# Patient Record
Sex: Male | Born: 1940 | Race: Black or African American | Hispanic: No | Marital: Married | Smoking: Never smoker
Health system: Southern US, Community
[De-identification: ages and names within clinical notes are randomized; demographics above are authoritative.]

## PROBLEM LIST (undated history)

## (undated) ENCOUNTER — Emergency Department (HOSPITAL_COMMUNITY)

## (undated) DIAGNOSIS — J302 Other seasonal allergic rhinitis: Secondary | ICD-10-CM

## (undated) DIAGNOSIS — I10 Essential (primary) hypertension: Secondary | ICD-10-CM

## (undated) DIAGNOSIS — J45909 Unspecified asthma, uncomplicated: Secondary | ICD-10-CM

---

## 2006-02-02 ENCOUNTER — Encounter: Admission: RE | Admit: 2006-02-02 | Discharge: 2006-02-02 | Payer: Self-pay | Admitting: Family Medicine

## 2010-10-19 ENCOUNTER — Emergency Department (HOSPITAL_COMMUNITY)
Admission: EM | Admit: 2010-10-19 | Discharge: 2010-10-19 | Disposition: A | Discharge status: Home / Self Care | Payer: Medicare Other | Attending: Emergency Medicine | Admitting: Emergency Medicine

## 2010-10-19 DIAGNOSIS — N390 Urinary tract infection, site not specified: Secondary | ICD-10-CM | POA: Insufficient documentation

## 2010-10-19 DIAGNOSIS — J45909 Unspecified asthma, uncomplicated: Secondary | ICD-10-CM | POA: Insufficient documentation

## 2010-10-19 DIAGNOSIS — R350 Frequency of micturition: Secondary | ICD-10-CM | POA: Insufficient documentation

## 2010-10-19 LAB — URINE MICROSCOPIC-ADD ON

## 2010-10-19 LAB — URINALYSIS, ROUTINE W REFLEX MICROSCOPIC
Bilirubin Urine: NEGATIVE
Glucose, UA: NEGATIVE mg/dL
Ketones, ur: NEGATIVE mg/dL
Nitrite: POSITIVE — AB
Specific Gravity, Urine: 1.019 (ref 1.005–1.030)
Urobilinogen, UA: 1 mg/dL (ref 0.0–1.0)
pH: 5.5 (ref 5.0–8.0)

## 2010-10-19 LAB — GLUCOSE, CAPILLARY: Glucose-Capillary: 127 mg/dL — ABNORMAL HIGH (ref 70–99)

## 2010-10-21 LAB — URINE CULTURE: Culture  Setup Time: 201210061457

## 2011-02-24 DIAGNOSIS — J45909 Unspecified asthma, uncomplicated: Secondary | ICD-10-CM | POA: Diagnosis not present

## 2011-02-24 DIAGNOSIS — I10 Essential (primary) hypertension: Secondary | ICD-10-CM | POA: Diagnosis not present

## 2011-02-24 DIAGNOSIS — E7800 Pure hypercholesterolemia, unspecified: Secondary | ICD-10-CM | POA: Diagnosis not present

## 2011-02-24 DIAGNOSIS — Z79899 Other long term (current) drug therapy: Secondary | ICD-10-CM | POA: Diagnosis not present

## 2011-02-24 DIAGNOSIS — J309 Allergic rhinitis, unspecified: Secondary | ICD-10-CM | POA: Diagnosis not present

## 2011-02-24 DIAGNOSIS — Z23 Encounter for immunization: Secondary | ICD-10-CM | POA: Diagnosis not present

## 2011-08-28 ENCOUNTER — Emergency Department (INDEPENDENT_AMBULATORY_CARE_PROVIDER_SITE_OTHER)
Admission: EM | Admit: 2011-08-28 | Discharge: 2011-08-28 | Disposition: A | Discharge status: Home / Self Care | Payer: Medicare Other | Source: Home / Self Care | Attending: Emergency Medicine | Admitting: Emergency Medicine

## 2011-08-28 ENCOUNTER — Encounter (HOSPITAL_COMMUNITY): Payer: Self-pay | Admitting: Emergency Medicine

## 2011-08-28 DIAGNOSIS — J309 Allergic rhinitis, unspecified: Secondary | ICD-10-CM

## 2011-08-28 DIAGNOSIS — L738 Other specified follicular disorders: Secondary | ICD-10-CM

## 2011-08-28 DIAGNOSIS — L538 Other specified erythematous conditions: Secondary | ICD-10-CM

## 2011-08-28 DIAGNOSIS — L304 Erythema intertrigo: Secondary | ICD-10-CM

## 2011-08-28 DIAGNOSIS — L739 Follicular disorder, unspecified: Secondary | ICD-10-CM

## 2011-08-28 HISTORY — DX: Essential (primary) hypertension: I10

## 2011-08-28 HISTORY — DX: Unspecified asthma, uncomplicated: J45.909

## 2011-08-28 HISTORY — DX: Other seasonal allergic rhinitis: J30.2

## 2011-08-28 MED ORDER — ALUM SULFATE-CA ACETATE EX PACK: 1.0000 | PACK | Freq: Two times a day (BID) | CUTANEOUS | Status: AC

## 2011-08-28 MED ORDER — CEPHALEXIN 500 MG PO CAPS: 500.0000 mg | ORAL_CAPSULE | Freq: Four times a day (QID) | ORAL | Status: AC

## 2011-08-28 MED ORDER — NYSTATIN 100000 UNIT/GM EX POWD: Freq: Four times a day (QID) | CUTANEOUS | Status: AC

## 2011-08-28 MED ORDER — NYSTATIN 100000 UNIT/GM EX CREA: TOPICAL_CREAM | CUTANEOUS | Status: AC

## 2011-08-28 NOTE — ED Provider Notes (Signed)
History     CSN: [id number]  Arrival date & time 08/28/11  1321   First MD Initiated Contact with Patient 08/28/11 1348      Chief Complaint  Patient presents with  . Rash    (Consider location/radiation/quality/duration/timing/severity/associated sxs/prior treatment) HPI Comments: Patient reports a flat, macerated, itchy areas under bilateral breasts starting several weeks ago. He started using Gold Bond lotion with some relief, but started using Cetaphil soap which has started a different, pustular rash over his chest starting yesterday. Denies any rash anywhere else. No nausea, vomiting, fevers. No other new lotions, soaps, detergents. No contacts with similar rash. No exposure to poison ivy, no pets in the house.   Second, patient reports 9 months of persistent nasal congestion. States that he's been tried on multiple nasal sprays, but isn't sure of the names. States that he intermittently uses Afrin, no use in 3 days. States that "nothing works". He has a history of severe seasonal allergies, and allergic rhinitis. Denies epistaxis, nasal drainage, facial pressure/pain, fevers.  ROS as noted in HPI. All other ROS negative.   Patient is a 71 y.o. male presenting with rash. The history is provided by the patient. No language interpreter was used.  Rash  This is a new problem. The current episode started more than 1 week ago. The problem has been gradually worsening. The problem is associated with an unknown factor. There has been no fever. The rash is present on the torso. Associated symptoms include itching. Pertinent negatives include no blisters, no pain and no weeping.    Past Medical History  Diagnosis Date  . Hypertension   . Asthma   . Seasonal allergies     History reviewed. No pertinent past surgical history.  History reviewed. No pertinent family history.  History  Substance Use Topics  . Smoking status: Never Smoker   . Smokeless tobacco: Not on file  . Alcohol  Use: No      Review of Systems  Skin: Positive for itching and rash.    Allergies  Review of patient's allergies indicates no known allergies.  Home Medications   Current Outpatient Rx  Name Route Sig Dispense Refill  . ALBUTEROL SULFATE (2.5 MG/3ML) 0.083% IN NEBU Nebulization Take 2.5 mg by nebulization every 6 (six) hours as needed.    . ALBUTEROL IN Inhalation Inhale into the lungs.    Marland Kitchen OVER THE COUNTER MEDICATION  One type of blood pressure medicine, cannot remember name. Also using a nasal spray, unsure of name    . UNKNOWN TO PATIENT  BP med    . UNKNOWN TO PATIENT  Nasal steriod    . ALUM SULFATE-CA ACETATE EX PACK Topical Apply 1 packet topically 2 (two) times daily. 1 packet in warm water. Soak for 15 minutes bid. 100 each 0  . CEPHALEXIN 500 MG PO CAPS Oral Take 1 capsule (500 mg total) by mouth 4 (four) times daily. X 10 days 40 capsule 0  . NYSTATIN 100000 UNIT/GM EX POWD Topical Apply topically 4 (four) times daily. 15 g 0  . NYSTATIN 100000 UNIT/GM EX CREA  Apply to affected area 2 times daily till symptoms resolve 30 g 0    BP 135/80  Pulse 90  Temp 97.4 F (36.3 C) (Oral)  Resp 20  SpO2 98%  Physical Exam  Nursing note and vitals reviewed. Constitutional: He is oriented to person, place, and time. He appears well-developed and well-nourished.  HENT:  Head: Normocephalic and atraumatic.  Nose:  Right sinus exhibits no maxillary sinus tenderness and no frontal sinus tenderness. Left sinus exhibits no maxillary sinus tenderness and no frontal sinus tenderness.       Bilateral occlusion. Swollen, pale, boggy nasal mucosa. No purulent drainage.  Eyes: Conjunctivae and EOM are normal.  Neck: Normal range of motion.  Cardiovascular: Normal rate.   Pulmonary/Chest: Effort normal. No respiratory distress.  Abdominal: He exhibits no distension.  Musculoskeletal: Normal range of motion.  Neurological: He is alert and oriented to person, place, and time.  Coordination normal.  Skin: Skin is warm and dry. Rash noted.       Flat, macerated, weepy, erythematous symmetric areas under bilateral breasts consistent with intertrigo. Nontender Pustular rash on hair follicles on chest. No other rash anywhere else.  Psychiatric: He has a normal mood and affect. His behavior is normal. Judgment and thought content normal.    ED Course  Procedures (including critical care time)  Labs Reviewed - No data to display No results found.   1. Folliculitis   2. Allergic rhinitis   3. 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 Advised to stop using Afrin. He has already tried multiple  nasal steroids, and by mouth sedating antihistamines without improvement. No signs of sinusitis. Referring him to Dr. Emeline Darling, ENT on call.  He also with intertrigo bilateral underneath his breast- starting nystatin cream, and will have him move to the powder when his symptoms get better with. Has a folliculitis over his chest almost likely from the Cetaphil soap-  Keflex for this. Followup with Dr. Clovis Riley as needed.  Luiz Blare, MD 08/28/11 1556

## 2011-08-28 NOTE — ED Notes (Signed)
Reports rash on chest for 2 weeks, worsened lately.  Rash is a red, splotchy rash to chest, epigastric area. Reports rash on back.  Back does not look like chest  Reports itchy areas under breasts.   Also c/o nasal congestion for 8 months, reports seeing physician for this and tried many meds with no relief.

## 2011-09-17 DIAGNOSIS — I10 Essential (primary) hypertension: Secondary | ICD-10-CM | POA: Diagnosis not present

## 2011-09-17 DIAGNOSIS — Z79899 Other long term (current) drug therapy: Secondary | ICD-10-CM | POA: Diagnosis not present

## 2011-09-17 DIAGNOSIS — Z1211 Encounter for screening for malignant neoplasm of colon: Secondary | ICD-10-CM | POA: Diagnosis not present

## 2011-09-17 DIAGNOSIS — J45909 Unspecified asthma, uncomplicated: Secondary | ICD-10-CM | POA: Diagnosis not present

## 2011-09-17 DIAGNOSIS — J309 Allergic rhinitis, unspecified: Secondary | ICD-10-CM | POA: Diagnosis not present

## 2012-01-11 ENCOUNTER — Emergency Department (HOSPITAL_COMMUNITY)
Admission: EM | Admit: 2012-01-11 | Discharge: 2012-01-11 | Disposition: A | Discharge status: Home / Self Care | Payer: Medicare Other | Attending: Emergency Medicine | Admitting: Emergency Medicine

## 2012-01-11 ENCOUNTER — Emergency Department (HOSPITAL_COMMUNITY): Payer: Medicare Other

## 2012-01-11 ENCOUNTER — Encounter (HOSPITAL_COMMUNITY): Payer: Self-pay | Admitting: *Deleted

## 2012-01-11 DIAGNOSIS — J45909 Unspecified asthma, uncomplicated: Secondary | ICD-10-CM | POA: Diagnosis not present

## 2012-01-11 DIAGNOSIS — R0602 Shortness of breath: Secondary | ICD-10-CM | POA: Diagnosis not present

## 2012-01-11 DIAGNOSIS — J45901 Unspecified asthma with (acute) exacerbation: Secondary | ICD-10-CM

## 2012-01-11 DIAGNOSIS — I10 Essential (primary) hypertension: Secondary | ICD-10-CM | POA: Insufficient documentation

## 2012-01-11 DIAGNOSIS — R062 Wheezing: Secondary | ICD-10-CM | POA: Diagnosis not present

## 2012-01-11 DIAGNOSIS — R05 Cough: Secondary | ICD-10-CM | POA: Diagnosis not present

## 2012-01-11 LAB — CBC WITH DIFFERENTIAL/PLATELET
Basophils Absolute: 0 10*3/uL (ref 0.0–0.1)
Eosinophils Relative: 7 % — ABNORMAL HIGH (ref 0–5)
HCT: 38.1 % — ABNORMAL LOW (ref 39.0–52.0)
MCV: 83.2 fL (ref 78.0–100.0)
Monocytes Relative: 7 % (ref 3–12)
Neutro Abs: 3.4 10*3/uL (ref 1.7–7.7)
RBC: 4.58 MIL/uL (ref 4.22–5.81)
WBC: 5.5 10*3/uL (ref 4.0–10.5)

## 2012-01-11 LAB — BASIC METABOLIC PANEL
CO2: 24 mEq/L (ref 19–32)
Chloride: 101 mEq/L (ref 96–112)
GFR calc Af Amer: >90 mL/min (ref >90)
Glucose, Bld: 129 mg/dL — ABNORMAL HIGH (ref 70–99)
Potassium: 3.7 mEq/L (ref 3.5–5.1)

## 2012-01-11 MED ORDER — PREDNISONE 20 MG PO TABS
60.0000 mg | ORAL_TABLET | Freq: Once | ORAL | Status: AC
  Administered 2012-01-11: 60 mg via ORAL
  Filled 2012-01-11: qty 3

## 2012-01-11 MED ORDER — ALBUTEROL SULFATE (5 MG/ML) 0.5% IN NEBU
5.0000 mg | INHALATION_SOLUTION | Freq: Once | RESPIRATORY_TRACT | Status: AC
  Administered 2012-01-11: 5 mg via RESPIRATORY_TRACT
  Filled 2012-01-11: qty 40

## 2012-01-11 MED ORDER — IPRATROPIUM BROMIDE 0.02 % IN SOLN
0.5000 mg | Freq: Once | RESPIRATORY_TRACT | Status: AC
  Administered 2012-01-11: 0.5 mg via RESPIRATORY_TRACT
  Filled 2012-01-11: qty 2.5

## 2012-01-11 MED ORDER — PREDNISONE 50 MG PO TABS: ORAL_TABLET | ORAL | Status: DC

## 2012-01-11 NOTE — ED Notes (Addendum)
Pt presents with SOB that started tonight when he laid down.  Pt had 2 breathing treatments at home without relief, wheezing heard upon arrival to ED.  STAT breathing treatment given and EKG done.  Pt sts he is breathing better after the third treatment, placed on cardiac monitor.

## 2012-01-11 NOTE — ED Notes (Signed)
Pt to xray

## 2012-01-11 NOTE — ED Provider Notes (Signed)
History     CSN: [id number]  Arrival date & time 01/11/12  0035   First MD Initiated Contact with Patient 01/11/12 0250      Chief Complaint  Patient presents with  . Shortness of Breath     Patient is a 71 y.o. male presenting with shortness of breath. The history is provided by the patient.  Shortness of Breath  The current episode started today. The onset was gradual. The problem occurs continuously. The problem has been gradually worsening. The problem is moderate. The symptoms are relieved by beta-agonist inhalers. The symptoms are aggravated by activity. Associated symptoms include cough, shortness of breath and wheezing. Pertinent negatives include no chest pain.  pt reports cough, congestion, wheezing that worsened in the past several days He reports nasal congestion as well He reports he has been using his nebs at home with increasing frequency recently No cp.  No syncope.  He reports h/o asthma but it is usually well controlled  Past Medical History  Diagnosis Date  . Hypertension   . Asthma   . Seasonal allergies     History reviewed. No pertinent past surgical history.  History reviewed. No pertinent family history.  History  Substance Use Topics  . Smoking status: Never Smoker   . Smokeless tobacco: Not on file  . Alcohol Use: No      Review of Systems  Respiratory: Positive for cough, shortness of breath and wheezing.   Cardiovascular: Negative for chest pain.  Neurological: Negative for weakness.  Psychiatric/Behavioral: Negative for agitation.  All other systems reviewed and are negative.    Allergies  Review of patient's allergies indicates no known allergies.  Home Medications   Current Outpatient Rx  Name  Route  Sig  Dispense  Refill  . ALBUTEROL SULFATE HFA 108 (90 BASE) MCG/ACT IN AERS   Inhalation   Inhale 2 puffs into the lungs every 6 (six) hours as needed. wheezing         . ALBUTEROL SULFATE (2.5 MG/3ML) 0.083% IN NEBU  Nebulization   Take 2.5 mg by nebulization every 6 (six) hours as needed. wheezing         . AMLODIPINE BESY-BENAZEPRIL HCL 10-20 MG PO CAPS   Oral   Take 1 capsule by mouth daily.         Merrilee Seashore D PO   Oral   Take 1 tablet by mouth every 6 (six) hours as needed. For cold symptoms         . FLUTICASONE PROPIONATE  HFA 110 MCG/ACT IN AERO   Inhalation   Inhale 1 puff into the lungs 2 (two) times daily.         Marland Kitchen ALUM SULFATE-CA ACETATE EX PACK   Topical   Apply 1 packet topically 2 (two) times daily. 1 packet in warm water. Soak for 15 minutes bid.   100 each   0   . NYSTATIN 100000 UNIT/GM EX POWD   Topical   Apply topically 4 (four) times daily.   15 g   0   . NYSTATIN 100000 UNIT/GM EX CREA      Apply to affected area 2 times daily till symptoms resolve   30 g   0     Pulse 120  Temp 97.5 F (36.4 C)  Resp 26  SpO2 94% BP 134/81  Pulse 111  Temp 97.5 F (36.4 C)  Resp 17  SpO2 92%   Physical Exam CONSTITUTIONAL: Well developed/well nourished HEAD AND  FACE: Normocephalic/atraumatic EYES: EOMI/PERRL ENMT: Mucous membranes moist, nasal congestion noted NECK: supple no meningeal signs SPINE:entire spine nontender CV: S1/S2 noted, no murmurs/rubs/gallops noted LUNGS: scattered wheezing on my exam.  No distress noted ABDOMEN: soft, nontender, no rebound or guarding GU:no cva tenderness NEURO: Pt is awake/alert, moves all extremitiesx4 EXTREMITIES: pulses normal, full ROM, no edema noted SKIN: warm, color normal PSYCH: no abnormalities of mood noted  ED Course  Procedures   Labs Reviewed  BASIC METABOLIC PANEL - Abnormal; Notable for the following:    Glucose, Bld 129 (*)     GFR calc non Af Amer 81 (*)     All other components within normal limits  CBC WITH DIFFERENTIAL - Abnormal; Notable for the following:    Hemoglobin 12.2 (*)     HCT 38.1 (*)     Eosinophils Relative 7 (*)     All other components within normal limits   Dg  Chest 2 View (if Patient Has Fever And/or Copd)  01/11/2012  *RADIOLOGY REPORT*  Clinical Data: Cough, congestion and shortness of breath.  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  No edema, infiltrate, nodule or pneumothorax is identified.  No pleural effusions.  Heart size is stable and within normal limits. Bony thorax shows stable degenerative changes of the thoracic spine.  IMPRESSION: No active disease.   Original Report Authenticated By: Irish Lack, M.D.      3:20 AM Pt had already received nebs before my eval (his wheezing has improved) but will give another treatment 4:46 AM Pt feels improved.  He is in no distress.  No tachypnea noted   He was ambulatory without hypoxia/distress He requests d/c home   MDM  Nursing notes including past medical history and social history reviewed and considered in documentation xrays reviewed and considered Labs/vital reviewed and considered        Date: 01/11/2012  Rate: 114  Rhythm: sinus tachycardia  QRS Axis: normal  Intervals: normal  ST/T Wave abnormalities: nonspecific ST changes  Conduction Disutrbances:none    Joya Gaskins, MD 01/11/12 [phone]

## 2012-01-11 NOTE — ED Notes (Signed)
Pt ambulated in hallway from room 16 to pod C and back.  O2 sat remained between 96-97% and pain states he feels fine.  NAD.  Dr. Bebe Shaggy aware.

## 2012-01-11 NOTE — ED Notes (Signed)
Pt c/o worsening asthma, SOB  Tonight.  Has used 2 nebulizer tx at home with no relief.  C/o cough, nasal congestion denies fevers/chills

## 2012-03-24 DIAGNOSIS — J45909 Unspecified asthma, uncomplicated: Secondary | ICD-10-CM | POA: Diagnosis not present

## 2012-03-24 DIAGNOSIS — E7800 Pure hypercholesterolemia, unspecified: Secondary | ICD-10-CM | POA: Diagnosis not present

## 2012-03-24 DIAGNOSIS — I10 Essential (primary) hypertension: Secondary | ICD-10-CM | POA: Diagnosis not present

## 2012-03-24 DIAGNOSIS — J309 Allergic rhinitis, unspecified: Secondary | ICD-10-CM | POA: Diagnosis not present

## 2012-03-24 DIAGNOSIS — Z79899 Other long term (current) drug therapy: Secondary | ICD-10-CM | POA: Diagnosis not present

## 2012-10-07 DIAGNOSIS — J45909 Unspecified asthma, uncomplicated: Secondary | ICD-10-CM | POA: Diagnosis not present

## 2012-10-07 DIAGNOSIS — I10 Essential (primary) hypertension: Secondary | ICD-10-CM | POA: Diagnosis not present

## 2012-10-07 DIAGNOSIS — J309 Allergic rhinitis, unspecified: Secondary | ICD-10-CM | POA: Diagnosis not present

## 2012-10-07 DIAGNOSIS — Z79899 Other long term (current) drug therapy: Secondary | ICD-10-CM | POA: Diagnosis not present

## 2012-12-29 ENCOUNTER — Encounter (HOSPITAL_COMMUNITY): Payer: Self-pay | Admitting: Emergency Medicine

## 2012-12-29 ENCOUNTER — Emergency Department (HOSPITAL_COMMUNITY)
Admission: EM | Admit: 2012-12-29 | Discharge: 2012-12-29 | Disposition: A | Discharge status: Home / Self Care | Payer: Medicare Other | Attending: Emergency Medicine | Admitting: Emergency Medicine

## 2012-12-29 ENCOUNTER — Emergency Department (HOSPITAL_COMMUNITY): Payer: Medicare Other

## 2012-12-29 DIAGNOSIS — I10 Essential (primary) hypertension: Secondary | ICD-10-CM | POA: Diagnosis not present

## 2012-12-29 DIAGNOSIS — J209 Acute bronchitis, unspecified: Secondary | ICD-10-CM | POA: Diagnosis not present

## 2012-12-29 DIAGNOSIS — J45901 Unspecified asthma with (acute) exacerbation: Secondary | ICD-10-CM | POA: Diagnosis not present

## 2012-12-29 DIAGNOSIS — R042 Hemoptysis: Secondary | ICD-10-CM | POA: Diagnosis not present

## 2012-12-29 DIAGNOSIS — IMO0002 Reserved for concepts with insufficient information to code with codable children: Secondary | ICD-10-CM | POA: Diagnosis not present

## 2012-12-29 DIAGNOSIS — R1013 Epigastric pain: Secondary | ICD-10-CM | POA: Insufficient documentation

## 2012-12-29 DIAGNOSIS — R0789 Other chest pain: Secondary | ICD-10-CM | POA: Diagnosis not present

## 2012-12-29 DIAGNOSIS — Z79899 Other long term (current) drug therapy: Secondary | ICD-10-CM | POA: Diagnosis not present

## 2012-12-29 DIAGNOSIS — J40 Bronchitis, not specified as acute or chronic: Secondary | ICD-10-CM

## 2012-12-29 LAB — TROPONIN I: Troponin I: <0.3 ng/mL (ref <0.30)

## 2012-12-29 LAB — COMPREHENSIVE METABOLIC PANEL
ALT: 18 U/L (ref 0–53)
BUN: 17 mg/dL (ref 6–23)
Calcium: 9.2 mg/dL (ref 8.4–10.5)
Chloride: 105 mEq/L (ref 96–112)
Creatinine, Ser: 0.97 mg/dL (ref 0.50–1.35)
GFR calc Af Amer: >90 mL/min (ref >90)
Glucose, Bld: 95 mg/dL (ref 70–99)
Sodium: 140 mEq/L (ref 135–145)
Total Protein: 7.6 g/dL (ref 6.0–8.3)

## 2012-12-29 LAB — CBC WITH DIFFERENTIAL/PLATELET
Eosinophils Absolute: 0.4 10*3/uL (ref 0.0–0.7)
Eosinophils Relative: 9 % — ABNORMAL HIGH (ref 0–5)
HCT: 39.1 % (ref 39.0–52.0)
Hemoglobin: 13 g/dL (ref 13.0–17.0)
Lymphocytes Relative: 25 % (ref 12–46)
Lymphs Abs: 1.2 10*3/uL (ref 0.7–4.0)
MCH: 27.5 pg (ref 26.0–34.0)
MCV: 82.8 fL (ref 78.0–100.0)
Monocytes Absolute: 0.4 10*3/uL (ref 0.1–1.0)
Neutro Abs: 2.7 10*3/uL (ref 1.7–7.7)
Platelets: 220 10*3/uL (ref 150–400)
RDW: 14.9 % (ref 11.5–15.5)
WBC: 4.8 10*3/uL (ref 4.0–10.5)

## 2012-12-29 LAB — D-DIMER, QUANTITATIVE: D-Dimer, Quant: 1.08 ug/mL-FEU — ABNORMAL HIGH (ref 0.00–0.48)

## 2012-12-29 MED ORDER — SODIUM CHLORIDE 0.9 % IV BOLUS (SEPSIS)
1000.0000 mL | Freq: Once | INTRAVENOUS | Status: AC
  Administered 2012-12-29: 1000 mL via INTRAVENOUS

## 2012-12-29 MED ORDER — IOHEXOL 350 MG/ML SOLN
100.0000 mL | Freq: Once | INTRAVENOUS | Status: AC | PRN
  Administered 2012-12-29: 100 mL via INTRAVENOUS

## 2012-12-29 MED ORDER — AZITHROMYCIN 250 MG PO TABS: 250.0000 mg | ORAL_TABLET | Freq: Every day | ORAL | Status: DC

## 2012-12-29 NOTE — ED Provider Notes (Signed)
  Face-to-face evaluation   History: Cough, productive of sputum for several days, now with blood. Mild, chronic shortness of breath is unchanged. He denies chest pain.  Physical exam: Obese, alert man in mild discomfort. On evaluation-decreased air movement, bilateral lungs without wheezes, rales, or rhonchi  Medical screening examination/treatment/procedure(s) were conducted as a shared visit with non-physician practitioner(s) and myself.  I personally evaluated the patient during the encounter  Flint Melter, MD 12/30/12 1650

## 2012-12-29 NOTE — ED Notes (Signed)
EKG completed and given to EDP.  

## 2012-12-29 NOTE — ED Notes (Signed)
PT reports coughing up bright red blood mixed with mucous this morning for about 2 mins. PT reports he's been coughing up mucous for about a week and states it has been mostly clear or yellow.

## 2012-12-29 NOTE — ED Provider Notes (Signed)
CSN: [id number]     Arrival date & time 12/29/12  0900 History   First MD Initiated Contact with Patient 12/29/12 0940     Chief Complaint  Patient presents with  . Cough  . Hemoptysis   (Consider location/radiation/quality/duration/timing/severity/associated sxs/prior Treatment) The history is provided by the patient. No language interpreter was used.  [name] [name] is a 72 y/o M with PMHx of HTN and asthma presenting to the ED with nasal congestion and cough that has been ongoing for the past year - denied history of smoking. Patient reported that for the past 2 weeks he has been experiencing chest discomfort described as a tightness to the center of his chest that is intermittent. Patient reported that for the past 2 weeks he has been having hemoptysis every morning, with this morning being the worst case. Patient reported that he coughed up bright red blood and, streaking in the mucus that he coughs up - denied clots. Patient reported that he likes to eat foods that are spicy, loves hot sauce and reported that he has epigastric discomfort when laying down - stated that he eats dinner late at night and then goes to bed shortly afterwards. Patient reported that he has been feeling tired lately. When asked about shortness of breath, patient reported that he is always out of breath that this is nothing new. Denied difficulty breathing, nausea, vomiting, diarrhea, dizziness, headache, fainting, melena, hematochezia, abdominal pain, dysuria, hematuria.  PCP dr. Clovis Riley  Past Medical History  Diagnosis Date  . Hypertension   . Asthma   . Seasonal allergies    History reviewed. No pertinent past surgical history. History reviewed. No pertinent family history. History  Substance Use Topics  . Smoking status: Never Smoker   . Smokeless tobacco: Not on file  . Alcohol Use: No    Review of Systems  Constitutional: Negative for fever and chills.  HENT: Negative for sore throat and trouble  swallowing.   Respiratory: Positive for cough and chest tightness.        Hemoptysis  Gastrointestinal: Negative for nausea, vomiting, abdominal pain and diarrhea.  Genitourinary: Negative for hematuria and decreased urine volume.  Musculoskeletal: Negative for back pain and neck pain.  Neurological: Negative for dizziness, weakness and headaches.  All other systems reviewed and are negative.    Allergies  Review of patient's allergies indicates no known allergies.  Home Medications   Current Outpatient Rx  Name  Route  Sig  Dispense  Refill  . albuterol (PROAIR HFA) 108 (90 BASE) MCG/ACT inhaler   Inhalation   Inhale 1 puff into the lungs every 4 (four) hours as needed for wheezing or shortness of breath.         Marland Kitchen amLODipine-benazepril (LOTREL) 10-20 MG per capsule   Oral   Take 1 capsule by mouth daily.         . beclomethasone (QVAR) 40 MCG/ACT inhaler   Inhalation   Inhale 1 puff into the lungs 2 (two) times daily as needed (shortness of breath).         . Chlorphen-Pseudoephed-APAP (CORICIDIN D PO)   Oral   Take 2 tablets by mouth every 6 (six) hours as needed (cold symptoms).         Marland Kitchen ipratropium-albuterol (DUONEB) 0.5-2.5 (3) MG/3ML SOLN   Nebulization   Take 3 mLs by nebulization 2 (two) times daily as needed (tightness).         Marland Kitchen loratadine (CLARITIN) 10 MG tablet   Oral  Take 10 mg by mouth daily as needed for allergies.         . sodium chloride (OCEAN) 0.65 % nasal spray   Each Nare   Place 2 sprays into both nostrils daily as needed for congestion.         Marland Kitchen azithromycin (ZITHROMAX) 250 MG tablet   Oral   Take 1 tablet (250 mg total) by mouth daily. Take first 2 tablets together, then 1 every day until finished.   6 tablet   0    BP 118/71  Pulse 79  Temp(Src) 98.3 F (36.8 C) (Oral)  Resp 16  SpO2 99% Physical Exam  Nursing note and vitals reviewed. Constitutional: He is oriented to person, place, and time. He appears  well-developed and well-nourished. No distress.  HENT:  Head: Normocephalic and atraumatic.  Mouth/Throat: Oropharynx is clear and moist. No oropharyngeal exudate.  Eyes: Conjunctivae and EOM are normal. Pupils are equal, round, and reactive to light. Right eye exhibits no discharge. Left eye exhibits no discharge.  Neck: Normal range of motion. Neck supple.  Cardiovascular: Normal rate, regular rhythm and normal heart sounds.   Pulses:      Radial pulses are 2+ on the right side, and 2+ on the left side.       Dorsalis pedis pulses are 2+ on the right side, and 2+ on the left side.  Pulmonary/Chest: Effort normal. He exhibits no tenderness.  Decreased breath sounds noted to upper and lower lobes bilaterally Negative respiratory distress Patient is able to speak in full sentences without difficulty Negative air hunger Negative use of accessory muscles  Musculoskeletal: Normal range of motion.  Full ROM to upper and lower extremities bilaterally without difficulty noted  Neurological: He is alert and oriented to person, place, and time. He exhibits normal muscle tone. Coordination normal.  Strength 5+/5+ to upper and lower extremities bilaterally with resistance applied, equal distribution noted  Skin: Skin is warm and dry. No rash noted. He is not diaphoretic. No erythema.  Psychiatric: He has a normal mood and affect. His behavior is normal. Thought content normal.    ED Course  Procedures (including critical care time)  Results for orders placed during the hospital encounter of 12/29/12  CBC WITH DIFFERENTIAL      Result Value Range   WBC 4.8  4.0 - 10.5 K/uL   RBC 4.72  4.22 - 5.81 MIL/uL   Hemoglobin 13.0  13.0 - 17.0 g/dL   HCT 09.8  11.9 - 14.7 %   MCV 82.8  78.0 - 100.0 fL   MCH 27.5  26.0 - 34.0 pg   MCHC 33.2  30.0 - 36.0 g/dL   RDW 82.9  56.2 - 13.0 %   Platelets 220  150 - 400 K/uL   Neutrophils Relative % 57  43 - 77 %   Neutro Abs 2.7  1.7 - 7.7 K/uL    Lymphocytes Relative 25  12 - 46 %   Lymphs Abs 1.2  0.7 - 4.0 K/uL   Monocytes Relative 9  3 - 12 %   Monocytes Absolute 0.4  0.1 - 1.0 K/uL   Eosinophils Relative 9 (*) 0 - 5 %   Eosinophils Absolute 0.4  0.0 - 0.7 K/uL   Basophils Relative 1  0 - 1 %   Basophils Absolute 0.0  0.0 - 0.1 K/uL  COMPREHENSIVE METABOLIC PANEL      Result Value Range   Sodium 140  135 - 145 mEq/L  Potassium 4.3  3.5 - 5.1 mEq/L   Chloride 105  96 - 112 mEq/L   CO2 25  19 - 32 mEq/L   Glucose, Bld 95  70 - 99 mg/dL   BUN 17  6 - 23 mg/dL   Creatinine, Ser 0.45  0.50 - 1.35 mg/dL   Calcium 9.2  8.4 - 40.9 mg/dL   Total Protein 7.6  6.0 - 8.3 g/dL   Albumin 3.8  3.5 - 5.2 g/dL   AST 20  0 - 37 U/L   ALT 18  0 - 53 U/L   Alkaline Phosphatase 146 (*) 39 - 117 U/L   Total Bilirubin 0.5  0.3 - 1.2 mg/dL   GFR calc non Af Amer 80 (*) >90 mL/min   GFR calc Af Amer >90  >90 mL/min  TROPONIN I      Result Value Range   Troponin I <0.30  <0.30 ng/mL  D-DIMER, QUANTITATIVE      Result Value Range   D-Dimer, Quant 1.08 (*) 0.00 - 0.48 ug/mL-FEU   Dg Chest 2 View  12/29/2012   CLINICAL DATA:  Cough and chest tightness.  EXAM: CHEST  2 VIEW  COMPARISON:  01/11/2012.  FINDINGS: Trachea is midline. Heart size normal. Lungs are mildly hyperinflated. Vascular confluence and superimposition of shadows is favored in the right midlung zone. Lungs are otherwise clear. No pleural fluid.  Flowing anterior osteophytosis in the thoracic spine. Prominent lateral osteophytes are also noted.  IMPRESSION: Mild hyperinflation without acute finding.   Electronically Signed   By: Leanna Battles M.D.   On: 12/29/2012 09:42   Ct Angio Chest Pe W/cm &/or Wo Cm  12/29/2012   CLINICAL DATA:  Hemoptysis.  EXAM: CT ANGIOGRAPHY CHEST WITH CONTRAST  TECHNIQUE: Multidetector CT imaging of the chest was performed using the standard protocol during bolus administration of intravenous contrast. Multiplanar CT image reconstructions including  MIPs were obtained to evaluate the vascular anatomy.  CONTRAST:  OMNIPAQUE IOHEXOL 350 MG/ML SOLN  COMPARISON:  Chest radiograph 12/29/2012.  FINDINGS: No pulmonary embolus. No pathologically enlarged mediastinal lymph nodes. Bi hilar lymphoid tissue. No axillary adenopathy. Heart size normal. No pericardial effusion.  Lungs are clear.  No pleural fluid.  Airway is unremarkable.  Incidental imaging of the upper abdomen shows no acute findings. No worrisome lytic or sclerotic lesions. Degenerative changes are seen in the spine.  Review of the MIP images confirms the above findings.  IMPRESSION: 1. Negative for pulmonary embolus. 2. No findings to explain the patient's hemoptysis.   Electronically Signed   By: Leanna Battles M.D.   On: 12/29/2012 16:37   Labs Review Labs Reviewed  CBC WITH DIFFERENTIAL - Abnormal; Notable for the following:    Eosinophils Relative 9 (*)    All other components within normal limits  COMPREHENSIVE METABOLIC PANEL - Abnormal; Notable for the following:    Alkaline Phosphatase 146 (*)    GFR calc non Af Amer 80 (*)    All other components within normal limits  D-DIMER, QUANTITATIVE - Abnormal; Notable for the following:    D-Dimer, Quant 1.08 (*)    All other components within normal limits  TROPONIN I   Imaging Review Dg Chest 2 View  12/29/2012   CLINICAL DATA:  Cough and chest tightness.  EXAM: CHEST  2 VIEW  COMPARISON:  01/11/2012.  FINDINGS: Trachea is midline. Heart size normal. Lungs are mildly hyperinflated. Vascular confluence and superimposition of shadows is favored in the right  midlung zone. Lungs are otherwise clear. No pleural fluid.  Flowing anterior osteophytosis in the thoracic spine. Prominent lateral osteophytes are also noted.  IMPRESSION: Mild hyperinflation without acute finding.   Electronically Signed   By: Leanna Battles M.D.   On: 12/29/2012 09:42   Ct Angio Chest Pe W/cm &/or Wo Cm  12/29/2012   CLINICAL DATA:  Hemoptysis.  EXAM:  CT ANGIOGRAPHY CHEST WITH CONTRAST  TECHNIQUE: Multidetector CT imaging of the chest was performed using the standard protocol during bolus administration of intravenous contrast. Multiplanar CT image reconstructions including MIPs were obtained to evaluate the vascular anatomy.  CONTRAST:  OMNIPAQUE IOHEXOL 350 MG/ML SOLN  COMPARISON:  Chest radiograph 12/29/2012.  FINDINGS: No pulmonary embolus. No pathologically enlarged mediastinal lymph nodes. Bi hilar lymphoid tissue. No axillary adenopathy. Heart size normal. No pericardial effusion.  Lungs are clear.  No pleural fluid.  Airway is unremarkable.  Incidental imaging of the upper abdomen shows no acute findings. No worrisome lytic or sclerotic lesions. Degenerative changes are seen in the spine.  Review of the MIP images confirms the above findings.  IMPRESSION: 1. Negative for pulmonary embolus. 2. No findings to explain the patient's hemoptysis.   Electronically Signed   By: Leanna Battles M.D.   On: 12/29/2012 16:37    EKG Interpretation    Date/Time:  Wednesday December 29 2012 10:41:33 EST Ventricular Rate:  89 PR Interval:  213 QRS Duration: 80 QT Interval:  363 QTC Calculation: 442 R Axis:   57 Text Interpretation:  Sinus rhythm Borderline prolonged PR interval Since last tracing PR interval has lengthened Confirmed by Effie Shy  MD, ELLIOTT ([id number]) on 12/29/2012 2:29:10 PM            MDM   1. Bronchitis   2. Hemoptysis    Medications  sodium chloride 0.9 % bolus 1,000 mL (0 mLs Intravenous Stopped 12/29/12 1557)  iohexol (OMNIPAQUE) 350 MG/ML injection 100 mL (100 mLs Intravenous Contrast Given 12/29/12 1612)   Filed Vitals:   12/29/12 1600 12/29/12 1640 12/29/12 1703 12/29/12 1715  BP: 120/64 124/69 121/68 118/71  Pulse: 77 82 81 79  Temp:      TempSrc:      Resp: 18 16 15 16   SpO2: 99% 96% 98% 99%    Patient presenting to the ED with hemoptysis that has been ongoing for the past 2 weeks with worsening this  morning with more blood than normal - described as bright red streaking. Denied clots. Alert and oriented. GCS 15. Lungs clear to auscultation to upper and lower lobes bilaterally. Pulses palpable and strong, radial and DP 2+ bilaterally. Full ROM to upper and lower extremities bilaterally. EKG negative acute ischemic abnormalities noted. Negative elevation troponin. D-dimer elevated at 1.08. CBC negative findings. CMP negative findings. Chest x-ray negative acute cardiopulmonary disease noted. CT angio of chest ordered to rule out PE. CT angio of chest negative for PE.  Negative findings for PE. Doubt cardiac issue. Suspicion to be bronchitis. Patient stable, afebrile. Pulse ox adequate on room air. Blood pressure controlled in ED setting. Negative tachycardia or tachypnea noted while in the ED setting. Discussed case with attending physician who agreed with discharge. Discharged patient with antibiotics. Discussed with patient to rest and stay hydrated. Discussed with patient proper eating habits regarding to decrease spicy food, increase water intake and to sit up. Referred patient to PCP. Discussed with patient to continue to monitor symptoms closely and if symptoms are to worsen or change to report  back to the ED - strict return instructions given.  Patient and wife agreed to plan of care, understood, all questions answered.   Raymon Mutton, PA-C 12/30/12 [phone]

## 2012-12-29 NOTE — Discharge Instructions (Signed)
Please call your doctor for a followup appointment within 24-48 hours. When you talk to your doctor please let them know that you were seen in the emergency department and have them acquire all of your records so that they can discuss the findings with you and formulate a treatment plan to fully care for your new and ongoing problems. Please call and set-up an appointment with primary care provider to be seen and re-assessed Please rest and stay hydrated Please take antibiotics as prescribed Please continue to take albuterol and ipratropium medications as prescribed Please avoid spicy food - increase water intake Please do not eat late at night, and sit up at least 2 hours after eating before laying down Please continue to monitor symptoms and if symptoms are to worsen or change (fever greater than 101, chills, sweating, nausea, vomiting, diarrhea, bloody stools, black tarry stools, abdominal pain, burning with urination, blood in urine, coughing up clots, changes to sputum color, chest pain, shortness of breath, difficulty breathing) please report back to the ED immediately   Bronchitis Bronchitis is the body's way of reacting to injury and/or infection (inflammation) of the bronchi. Bronchi are the air tubes that extend from the windpipe into the lungs. If the inflammation becomes severe, it may cause shortness of breath. CAUSES  Inflammation may be caused by:  A virus.  Germs (bacteria).  Dust.  Allergens.  Pollutants and many other irritants. The cells lining the bronchial tree are covered with tiny hairs (cilia). These constantly beat upward, away from the lungs, toward the mouth. This keeps the lungs free of pollutants. When these cells become too irritated and are unable to do their job, mucus begins to develop. This causes the characteristic cough of bronchitis. The cough clears the lungs when the cilia are unable to do their job. Without either of these protective mechanisms, the  mucus would settle in the lungs. Then you would develop pneumonia. Smoking is a common cause of bronchitis and can contribute to pneumonia. Stopping this habit is the single most important thing you can do to help yourself. TREATMENT   Your caregiver may prescribe an antibiotic if the cough is caused by bacteria. Also, medicines that open up your airways make it easier to breathe. Your caregiver may also recommend or prescribe an expectorant. It will loosen the mucus to be coughed up. Only take over-the-counter or prescription medicines for pain, discomfort, or fever as directed by your caregiver.  Removing whatever causes the problem (smoking, for example) is critical to preventing the problem from getting worse.  Cough suppressants may be prescribed for relief of cough symptoms.  Inhaled medicines may be prescribed to help with symptoms now and to help prevent problems from returning.  For those with recurrent (chronic) bronchitis, there may be a need for steroid medicines. SEEK IMMEDIATE MEDICAL CARE IF:   During treatment, you develop more pus-like mucus (purulent sputum).  You have a fever.  You become progressively more ill.  You have increased difficulty breathing, wheezing, or shortness of breath. It is necessary to seek immediate medical care if you are elderly or sick from any other disease. MAKE SURE YOU:   Understand these instructions.  Will watch your condition.  Will get help right away if you are not doing well or get worse. Document Released: 12/30/2004 Document Revised: 09/01/2012 Document Reviewed: 08/24/2012 [facility] Patient Information 2014 [place], [place].   Emergency Department Resource Guide 1) Find a Doctor and Pay Out of Pocket Although you won't have to  find out who is covered by your insurance plan, it is a good idea to ask around and get recommendations. You will then need to call the office and see if the doctor you have chosen will accept you as a  new patient and what types of options they offer for patients who are self-pay. Some doctors offer discounts or will set up payment plans for their patients who do not have insurance, but you will need to ask so you aren't surprised when you get to your appointment.  2) Contact Your Local Health Department Not all health departments have doctors that can see patients for sick visits, but many do, so it is worth a call to see if yours does. If you don't know where your local health department is, you can check in your phone book. The CDC also has a tool to help you locate your state's health department, and many state websites also have listings of all of their local health departments.  3) Find a Walk-in Clinic If your illness is not likely to be very severe or complicated, you may want to try a walk in clinic. These are popping up all over the country in pharmacies, drugstores, and shopping centers. They're usually staffed by nurse practitioners or physician assistants that have been trained to treat common illnesses and complaints. They're usually fairly quick and inexpensive. However, if you have serious medical issues or chronic medical problems, these are probably not your best option.  No Primary Care Doctor: - Call Health Connect at  [phone] - they can help you locate a primary care doctor that  accepts your insurance, provides certain services, etc. - Physician Referral Service- [phone]  Chronic Pain Problems: Organization         Address  Phone   Notes  [facility]  [phone] Patients need to be referred by their primary care doctor.   Medication Assistance: Organization         Address  Phone   Notes  [facility] Medication [facility] [address] [place], [place] [address] [phone] --Must be a resident of [facility] -- Must have NO insurance coverage whatsoever (no Medicaid/ Medicare, etc.) -- The pt. MUST have a  primary care doctor that directs their care regularly and follows them in the community   MedAssist  [phone]   [employer]  [phone]    Agencies that provide inexpensive medical care: Organization         Address  Phone   Notes  [facility]  [phone]   [facility]    [phone]   [facility] [address] [place], [place] [address] [phone]   [facility] [place] [address] [place] [place] [phone]   Planned Parenthood    [phone]   [facility]    [phone]   Community Health and [facility]  [address], [place] Phone:  [phone], Fax:  [phone] Hours of Operation:  9 am - 6 pm, M-F.  Also accepts Medicaid/Medicare and self-pay.  [facility]  [address], [place] Phone: [phone], Fax: [phone]. Hours of Operation:  8:30 am - 5:30 pm, M-F.  Also accepts Medicaid and self-pay.  [facility] [place] [address] [employer] Phone: [phone]   [facility] [address] [place], [place], [place] [phone], Ext. [phone] Mondays & Thursdays: 7-9 AM.  First 15 patients are seen on a first come, first serve basis.    Medicaid-accepting [facility] Providers:  Organization         Address  Phone   Notes  [facility] [address], [place] [phone] Also accepts self-pay patients.  [facility] [address] Laurell Josephs [place], [place]  [phone]   [facility] [address], [place] [phone]   [facility] [address] [place] [phone]   Renaye Rakers [address], [place]   [phone] Only accepts [place] Access [place] patients after they have their name applied to their card.   Self-Pay (no insurance) in [facility]:  Organization         Address  Phone   Notes  Sickle Cell Patients, [facility] [address] [place], [place] [phone]   [facility] [address] [place], [place] [phone]   [facility] [place]  [address], [place] [phone]   [facility]/Dr. Osei-Bonsu  [address], [place] or [address], [place] [phone] Phone number for both [place] and [place] locations is the same.  Urgent Medical and [facility] [address] [place] [phone]   [facility] [address] [place] or [address] [phone]   [facility] [address], [place] [phone], phone; [phone], fax Sees patients 1st and 3rd Saturday of every month.  Must not qualify for public or private insurance (i.e. Medicaid, Medicare, [place] Health Choice, Veterans' Benefits)  Household income should be no more than 200% of the poverty level The clinic cannot treat you if you are pregnant or think you are pregnant  Sexually transmitted diseases are not treated at the clinic.    Dental Care: Organization         Address  Phone  Notes  [facility] Department of [facility] [address] [place], [place] [phone] Accepts children up to age 53 who are enrolled in [place] or [place] Health Choice; pregnant women with a Medicaid card; and children who have applied for Medicaid or [place] Health Choice, but were declined, whose parents can pay a reduced fee at time of service.  [facility] Department of [facility]  [address], [place] [phone] Accepts children up to age 60 who are enrolled in [place] or [place] Health Choice; pregnant women with a Medicaid card; and children who have applied for Medicaid or [place] Health Choice, but were declined, whose parents can  pay a reduced fee at time of service.  [facility]  [address] [place], [place] [phone] Patients are seen by appointment only. Walk-ins are not accepted. [facility] will see patients 58 years of age and older. Monday - Tuesday (8am-5pm) Most Wednesdays (8:30-5pm) $30 per visit, cash only  [facility]  [address], [facility] [phone] Patients are seen by appointment only. Walk-ins are not accepted. [facility] will see patients 87 years of age and older. One Wednesday Evening (Monthly: Volunteer Based).  $30 per visit, cash only  [employer]  (  [phone]) [phone] for adults; Children under age 334, call Graduate Pediatric Dentistry at [phone]. Children aged 534-14, please call [phone] to request a pediatric application.  Dental services are provided in all areas of dental care including fillings, crowns and bridges, complete and partial dentures, implants, gum treatment, root canals, and extractions. Preventive care is also provided. Treatment is provided to both adults and children. Patients are selected via a lottery and there is often a waiting list.   [facility] [address], [place]  [phone] [email or web address]   [facility] [address], [place], [place] [phone], Ext. [phone] Second and Fourth Thursday of each month, opens at 6:30 AM; Clinic ends at 9 AM.  Patients are seen on a first-come first-served basis, and a limited number are seen during each clinic.   [facility]  [address], [place], [place] [phone]   Eligibility Requirements You must have lived in [place], [place], or [place] counties for at least the last three months.   You cannot be eligible for state or federal sponsored [place]healthcare insurance, including [employer]IG[employer]eterans Administration, [place]icaid, or [employer]icare.   You generally cannot be eligible for healthcare  insurance through your employer.    How to apply: Eligibility screenings are held every Tuesday and Wednesday afternoon from 1:00 pm until 4:00 pm. You do not need an appointment for the interview!  [facility] [address], [place], [place] [phone]   [facility]  [phone]   [facility]  [phone]   [facility]  [phone]    Behavioral Health Resources in the Community: Intensive Outpatient Programs Organization         Address  Phone  Notes  [facility] [address], [place], [place] [phone]   [facility] [address], [place], [place] [phone]   [facility] [address], [place], [place]  [phone]   [facility] [address],  [place], [place] [phone] or [phone]   Substance Abuse Resources Organization         Address  Phone  Notes  [facility]  [phone]   [facility]  [phone]   [facility]  [phone]   [facility]  [phone]   Residential & Outpatient Substance Abuse Program  [phone]   Psychological Services Organization         Address  Phone  Notes  [facility]  [phone]- [phone]   [facility]  [phone]- [phone]   [facility] [address], [place] [phone] or [phone]    Mobile Crisis Teams Organization         Address  Phone  Notes  [facility], Mobile Crisis Care Unit  [phone]   [facility]  [address] [place], [place] [phone]   Doristine LocksSharon DeEsch [address] [place] [phone]    Self-Help/Support Groups Organization         Address  Phone             Notes  [facility] [place] - variety of support groups  336- [phone] Call for more information  Narcotics Anonymous (NA),  [facility] [address], [place]  2 meetings at this location   Chief Executive Officeresidential Treatment Programs Organization         Address  Phone  Notes  [facility] [address],    [place]  [phone]   [facility]  [address] [place] [address], [place], [place] [phone]   [facility] [address] [place], [place] [phone] Admissions: 8am-3pm M-F  [facility] [address],    [place], [place] [phone]   [facility] [address] [place], [place], [place] [phone]   [facility] [address],  [place], [place] [phone]   [facility] - Intensive Outpatient [address], [place], [place] [phone]   [facility] ([facility] [address]  [place], [place] [phone] or [phone]   [facility] ([facility]) [address] [place], [place] [phone] Accepts Medicaid  [facility] [address],  [place] [phone] Substance Abuse/Addiction Treatment   [facility] Organization         Address  Phone  Notes  [facility]  [phone]   Angie Fava, PhD [address] [place]vin Kn[place], [place]   [phone] or [phone]   [facility]   [address] [place], [place] [phone]   [facility] [address], [place], [place] [phone] Insurance/Medicaid/sponsorship through [facility] and Families [address]                                    [place], [place] [phone] Therapy/tele-psych/case  [facility] [address], [place] [phone]    Dr. Lolly Mustache  [phone]   [facility]  [employer] [facility] Dept. 1) [address], [place] 2) [address], [place] 3)  [address], [place] [phone]  [phone]    [facility] Child Abuse Hotline [phone] or [phone] (After Hours)

## 2012-12-29 NOTE — ED Notes (Signed)
Attempted to draw D-dimer and Troponin from R & L hand w/out success. PT asked that I only look in his hands to draw blood

## 2012-12-29 NOTE — ED Notes (Signed)
Pt c/o cough with hemoptysis this am; pt sts hx of same with chronic issues but was worse this am; no distress noted and no fever noted

## 2012-12-29 NOTE — ED Notes (Signed)
IV team at bedside 

## 2012-12-31 DIAGNOSIS — Z1211 Encounter for screening for malignant neoplasm of colon: Secondary | ICD-10-CM | POA: Diagnosis not present

## 2012-12-31 DIAGNOSIS — J40 Bronchitis, not specified as acute or chronic: Secondary | ICD-10-CM | POA: Diagnosis not present

## 2013-01-20 ENCOUNTER — Emergency Department (INDEPENDENT_AMBULATORY_CARE_PROVIDER_SITE_OTHER): Payer: Medicare Other

## 2013-01-20 ENCOUNTER — Encounter (HOSPITAL_COMMUNITY): Payer: Self-pay | Admitting: Emergency Medicine

## 2013-01-20 ENCOUNTER — Emergency Department (INDEPENDENT_AMBULATORY_CARE_PROVIDER_SITE_OTHER)
Admission: EM | Admit: 2013-01-20 | Discharge: 2013-01-20 | Disposition: A | Discharge status: Home / Self Care | Payer: Medicare Other | Source: Home / Self Care | Attending: Family Medicine | Admitting: Family Medicine

## 2013-01-20 DIAGNOSIS — R066 Hiccough: Secondary | ICD-10-CM

## 2013-01-20 DIAGNOSIS — J069 Acute upper respiratory infection, unspecified: Secondary | ICD-10-CM

## 2013-01-20 DIAGNOSIS — J449 Chronic obstructive pulmonary disease, unspecified: Secondary | ICD-10-CM | POA: Diagnosis not present

## 2013-01-20 MED ORDER — GI COCKTAIL ~~LOC~~
ORAL | Status: AC
  Filled 2013-01-20: qty 30

## 2013-01-20 MED ORDER — DEXTROMETHORPHAN POLISTIREX 30 MG/5ML PO LQCR: 60.0000 mg | Freq: Two times a day (BID) | ORAL | Status: DC

## 2013-01-20 MED ORDER — GI COCKTAIL ~~LOC~~
30.0000 mL | Freq: Once | ORAL | Status: AC
  Administered 2013-01-20: 30 mL via ORAL

## 2013-01-20 MED ORDER — IPRATROPIUM BROMIDE 0.06 % NA SOLN: 2.0000 | Freq: Four times a day (QID) | NASAL | Status: DC

## 2013-01-20 NOTE — ED Provider Notes (Signed)
CSN: [id number]     Arrival date & time 01/20/13  1525 History   First MD Initiated Contact with Patient 01/20/13 1601     Chief Complaint  Patient presents with  . Cough  . Sore Throat  . Hiccups   (Consider location/radiation/quality/duration/timing/severity/associated sxs/prior Treatment) Patient is a 73 y.o. male presenting with cough. The history is provided by the patient and the spouse.  Cough Cough characteristics:  Productive Sputum characteristics:  Clear Severity:  Mild Onset quality:  Gradual Duration:  3 days Context: upper respiratory infection   Associated symptoms: fever and rhinorrhea   Associated symptoms: no chills   Associated symptoms comment:  Hiccups today, also st.   Past Medical History  Diagnosis Date  . Hypertension   . Asthma   . Seasonal allergies    History reviewed. No pertinent past surgical history. No family history on file. History  Substance Use Topics  . Smoking status: Never Smoker   . Smokeless tobacco: Not on file  . Alcohol Use: No    Review of Systems  Constitutional: Positive for fever. Negative for chills.  HENT: Positive for congestion, postnasal drip and rhinorrhea.   Respiratory: Positive for cough.   Cardiovascular: Negative.     Allergies  Review of patient's allergies indicates no known allergies.  Home Medications   Current Outpatient Rx  Name  Route  Sig  Dispense  Refill  . albuterol (PROAIR HFA) 108 (90 BASE) MCG/ACT inhaler   Inhalation   Inhale 1 puff into the lungs every 4 (four) hours as needed for wheezing or shortness of breath.         Marland Kitchen amLODipine-benazepril (LOTREL) 10-20 MG per capsule   Oral   Take 1 capsule by mouth daily.         Marland Kitchen azithromycin (ZITHROMAX) 250 MG tablet   Oral   Take 1 tablet (250 mg total) by mouth daily. Take first 2 tablets together, then 1 every day until finished.   6 tablet   0   . beclomethasone (QVAR) 40 MCG/ACT inhaler   Inhalation   Inhale 1 puff into  the lungs 2 (two) times daily as needed (shortness of breath).         . Chlorphen-Pseudoephed-APAP (CORICIDIN D PO)   Oral   Take 2 tablets by mouth every 6 (six) hours as needed (cold symptoms).         Marland Kitchen dextromethorphan (DELSYM) 30 MG/5ML liquid   Oral   Take 10 mLs (60 mg total) by mouth 2 (two) times daily. For cough   148 mL   0   . ipratropium (ATROVENT) 0.06 % nasal spray   Nasal   Place 2 sprays into the nose 4 (four) times daily.   15 mL   1   . ipratropium-albuterol (DUONEB) 0.5-2.5 (3) MG/3ML SOLN   Nebulization   Take 3 mLs by nebulization 2 (two) times daily as needed (tightness).         Marland Kitchen loratadine (CLARITIN) 10 MG tablet   Oral   Take 10 mg by mouth daily as needed for allergies.         . sodium chloride (OCEAN) 0.65 % nasal spray   Each Nare   Place 2 sprays into both nostrils daily as needed for congestion.          BP 132/73  Pulse 123  Temp(Src) 99.6 F (37.6 C) (Oral)  Resp 18  SpO2 95% Physical Exam  Nursing note and vitals reviewed.  Constitutional: He appears well-developed and well-nourished. No distress.  HENT:  Head: Normocephalic.  Right Ear: External ear normal.  Left Ear: External ear normal.  Mouth/Throat: Oropharynx is clear and moist.  Eyes: Pupils are equal, round, and reactive to light.  Neck: Normal range of motion. Neck supple.  Cardiovascular: Regular rhythm, normal heart sounds and intact distal pulses.   Pulmonary/Chest: Effort normal and breath sounds normal.  Abdominal: Soft. Bowel sounds are normal.    ED Course  Procedures (including critical care time) Labs Review Labs Reviewed - No data to display Imaging Review Dg Chest 2 View  01/20/2013   CLINICAL DATA:  Productive cough and low-grade fever, recent history of bronchitis.  EXAM: CHEST  2 VIEW  COMPARISON:  Chest X ray dated December 29, 2012.  FINDINGS: The lungs are adequately inflated. There is no focal infiltrate. The cardiac silhouette is normal  in size. The pulmonary vascularity is not engorged. The mediastinum is normal in width. There is mild tortuosity of the descending thoracic aorta. There is no pleural effusion. The observed portions of the bony thorax exhibit no acute abnormality. There are degenerative changes of the thoracic spine with calcification of the anterior longitudinal ligament.  IMPRESSION: There is no evidence of pneumonia nor CHF. There is hyperinflation consistent with COPD. I cannot exclude acute bronchitis in the appropriate clinical setting.   Electronically Signed   By: David  Martinique   On: 01/20/2013 17:02    EKG Interpretation    Date/Time:    Ventricular Rate:    PR Interval:    QRS Duration:   QT Interval:    QTC Calculation:   R Axis:     Text Interpretation:              MDM  X-rays reviewed and report per radiologist. Hiccups resolved with gi cocktail.     Billy Fischer, MD 01/20/13 [phone]

## 2013-01-20 NOTE — Discharge Instructions (Signed)
Drink plenty of fluids as discussed, use medicine as prescribed, and mucinex or delsym for cough. Return or see your doctor if further problems °

## 2013-01-20 NOTE — ED Notes (Signed)
Cold symptoms, sore, scratchy throat, overall soreness. Reports hiccups all day today.  Coughing up slear/white phlegm.  No nausea or vomiting

## 2013-01-23 ENCOUNTER — Emergency Department (HOSPITAL_COMMUNITY)
Admission: EM | Admit: 2013-01-23 | Discharge: 2013-01-23 | Disposition: A | Discharge status: Home / Self Care | Payer: Medicare Other | Attending: Emergency Medicine | Admitting: Emergency Medicine

## 2013-01-23 ENCOUNTER — Emergency Department (HOSPITAL_COMMUNITY): Payer: Medicare Other

## 2013-01-23 ENCOUNTER — Encounter (HOSPITAL_COMMUNITY): Payer: Self-pay | Admitting: Emergency Medicine

## 2013-01-23 DIAGNOSIS — J309 Allergic rhinitis, unspecified: Secondary | ICD-10-CM | POA: Insufficient documentation

## 2013-01-23 DIAGNOSIS — R0789 Other chest pain: Secondary | ICD-10-CM | POA: Insufficient documentation

## 2013-01-23 DIAGNOSIS — J449 Chronic obstructive pulmonary disease, unspecified: Secondary | ICD-10-CM | POA: Diagnosis not present

## 2013-01-23 DIAGNOSIS — I10 Essential (primary) hypertension: Secondary | ICD-10-CM | POA: Diagnosis not present

## 2013-01-23 DIAGNOSIS — R059 Cough, unspecified: Secondary | ICD-10-CM | POA: Diagnosis not present

## 2013-01-23 DIAGNOSIS — Z79899 Other long term (current) drug therapy: Secondary | ICD-10-CM | POA: Insufficient documentation

## 2013-01-23 DIAGNOSIS — J45909 Unspecified asthma, uncomplicated: Secondary | ICD-10-CM | POA: Diagnosis not present

## 2013-01-23 DIAGNOSIS — R1013 Epigastric pain: Secondary | ICD-10-CM | POA: Insufficient documentation

## 2013-01-23 DIAGNOSIS — R066 Hiccough: Secondary | ICD-10-CM | POA: Diagnosis not present

## 2013-01-23 DIAGNOSIS — R05 Cough: Secondary | ICD-10-CM | POA: Insufficient documentation

## 2013-01-23 MED ORDER — LIDOCAINE VISCOUS 2 % MT SOLN: 15.0000 mL | Freq: Once | OROMUCOSAL | Status: DC

## 2013-01-23 MED ORDER — PROCHLORPERAZINE MALEATE 5 MG PO TABS
5.0000 mg | ORAL_TABLET | Freq: Once | ORAL | Status: AC
  Administered 2013-01-23: 5 mg via ORAL
  Filled 2013-01-23: qty 1

## 2013-01-23 NOTE — ED Notes (Signed)
Pt states recurring hiccups, ongoing since last Thursday. Also states sore throat and congestion. Was recently seen for same on 1/8 at [facility]. Respirations unlabored. Denies chest pain at present. Lung sounds clear and equal bilaterally. Pt is alert and oriented x4. NAD.

## 2013-01-23 NOTE — ED Provider Notes (Signed)
CSN: [id number]     Arrival date & time 01/23/13  3329 History   First MD Initiated Contact with Patient 01/23/13 1113     Chief Complaint  Patient presents with  . Hiccups  . Chest Pain    tightness   (Consider location/radiation/quality/duration/timing/severity/associated sxs/prior Treatment) HPI Comments: [name] [name] is a 73 yo AA male with cc of hiccups.  Pt was seen on 01/20/13 for same and the symptoms resolved after a GI cocktail.  They were gone for 1/2 day.  The hiccups returned and have been persistent.  Pt denies a history of hiccups prior to this.  Pt also c/o cough and uri symptoms.   He was diagnosed with bronchitis.  Pt denies recent travel, sick contacts, n/v/d, abd pain, f/ud, CP or new SOB.  Pt is completely symptom free at time of H and P.  No hiccups.     Patient is a 73 y.o. male presenting with chest pain. The history is provided by the patient and the spouse.  Chest Pain Pain location:  Epigastric Pain quality: aching   Pain radiates to:  Does not radiate Pain radiates to the back: no   Pain severity:  Mild Onset quality:  Gradual Timing:  Intermittent Chronicity:  New Context comment:  Discomfort associated with hiccups only, no CP otherwise Relieved by:  Nothing Worsened by:  Nothing tried Ineffective treatments:  None tried Associated symptoms: abdominal pain and cough   Associated symptoms: no nausea and not vomiting   Associated symptoms comment:  Mild epigastric pain associated with the hiccups   Past Medical History  Diagnosis Date  . Hypertension   . Asthma   . Seasonal allergies    History reviewed. No pertinent past surgical history. No family history on file. History  Substance Use Topics  . Smoking status: Never Smoker   . Smokeless tobacco: Not on file  . Alcohol Use: No    Review of Systems  Constitutional: Negative.   HENT: Positive for congestion.   Eyes: Negative.   Respiratory: Positive for cough.   Cardiovascular:  Positive for chest pain.       Epigastric pain associated with hiccups only.    Gastrointestinal: Positive for abdominal pain. Negative for nausea, vomiting, diarrhea, constipation, blood in stool, abdominal distention, anal bleeding and rectal pain.  Endocrine: Negative.   Genitourinary: Negative.   Musculoskeletal: Negative.   Neurological: Negative.     Allergies  Review of patient's allergies indicates no known allergies.  Home Medications   Current Outpatient Rx  Name  Route  Sig  Dispense  Refill  . acetaminophen (TYLENOL) 500 MG tablet   Oral   Take 1,000 mg by mouth every 6 (six) hours as needed for headache.         . albuterol (PROAIR HFA) 108 (90 BASE) MCG/ACT inhaler   Inhalation   Inhale 1 puff into the lungs every 4 (four) hours as needed for wheezing or shortness of breath.         Marland Kitchen amLODipine-benazepril (LOTREL) 10-20 MG per capsule   Oral   Take 1 capsule by mouth daily.         . beclomethasone (QVAR) 40 MCG/ACT inhaler   Inhalation   Inhale 1 puff into the lungs 2 (two) times daily as needed (shortness of breath).         Marland Kitchen dextromethorphan (DELSYM) 30 MG/5ML liquid   Oral   Take 10 mLs (60 mg total) by mouth 2 (two) times  daily. For cough   148 mL   0   . ipratropium (ATROVENT) 0.06 % nasal spray   Nasal   Place 2 sprays into the nose 4 (four) times daily.   15 mL   1   . ipratropium-albuterol (DUONEB) 0.5-2.5 (3) MG/3ML SOLN   Nebulization   Take 3 mLs by nebulization 2 (two) times daily as needed (tightness).          BP 136/66  Pulse 99  Temp(Src) 97.9 F (36.6 C) (Oral)  Resp 18  Wt 245 lb (111.131 kg)  SpO2 96% Physical Exam  Nursing note and vitals reviewed. Constitutional: He is oriented to person, place, and time. He appears well-developed and well-nourished.  HENT:  Head: Normocephalic and atraumatic.  Nose: Nose normal.  Mouth/Throat: Oropharynx is clear and moist.  Eyes: Conjunctivae and EOM are normal. Pupils  are equal, round, and reactive to light.  Neck: Normal range of motion. Neck supple.  Cardiovascular: Normal rate and regular rhythm.   Pulmonary/Chest: Breath sounds normal. No respiratory distress. He has no wheezes. He has no rales.  Abdominal: Soft. Bowel sounds are normal. He exhibits no distension and no mass. There is no tenderness. There is no rebound and no guarding.  Musculoskeletal: Normal range of motion. He exhibits no edema and no tenderness.  Neurological: He is alert and oriented to person, place, and time. He has normal reflexes.   Patient has no neurologic deficit based on history or physical exam.   ED Course  Procedures (including critical care time) Labs Review Labs Reviewed - No data to display Imaging Review Dg Chest 2 View  01/23/2013   CLINICAL DATA:  Congestion  EXAM: CHEST  2 VIEW  COMPARISON:  01/20/2013  FINDINGS: The heart size and mediastinal contours are within normal limits. Both lungs are mildly hyperinflated. The visualized skeletal structures are unremarkable.  IMPRESSION: COPD without acute abnormality.   Electronically Signed   By: Inez Catalina M.D.   On: 01/23/2013 14:27    EKG Interpretation    Date/Time:  Sunday January 23 2013 09:03:01 EST Ventricular Rate:  93 PR Interval:  190 QRS Duration: 84 QT Interval:  354 QTC Calculation: 440 R Axis:   66 Text Interpretation:  Normal sinus rhythm Normal ECG Confirmed by MASNERI  MD, DAVID ([id number]) on 01/23/2013 11:37:57 AM            MDM   1. Intractable hiccups    73 year old African American male presents emergency department with chief complaint of hiccups and epigastric discomfort associated with hiccups.  Patient has had protracted singultus for which he was seen on January 8 and given a GI cocktail. The hiccups returned.    ER workup: EKG and chest x-ray unremarkable for acute findings. Patient was initially symptom-free upon arrival however the symptoms returned during the ED stay. He  was given Compazine with minimal relief. Singultus technique OMT performed with minimal relief. Pt was given three teaspoons of peanut butter to swallow. This home remedy seemed to completely resolve his symptoms.  Patient was completely symptom-free for over one hour and requests to go home. At this point I see no further need for workup or treatment. ER precautions given. I do not suspect ACS, serious bacterial illness, intrathoracic or intra-abdominal emergent issue. Patient and his wife are completely satisfied and will followup with PCP or ER as needed   Elmer Sow, MD 01/23/13 1526

## 2013-01-23 NOTE — ED Notes (Signed)
EDP at bedside. Pt instructed to eat peanut butter in attempt to relieve hiccups.

## 2013-01-23 NOTE — Discharge Instructions (Signed)
Hiccups °Hiccups are caused by a sudden contraction of the muscles between the ribs and the muscle under your lungs (diaphragm). When you hiccup, the top of your windpipe (glottis) closes immediately after your diaphragm contracts. This makes the typical 'hic' sound. A hiccup is a reflex that you cannot stop. Unlike other reflexes such as coughing and sneezing, hiccups do not seem to have any useful purpose. There are 3 types of hiccups:  °· Benign bouts: last less than 48 hours. °· Persistent: last more than 48 hours but less than 1 month. °· Intractable: last more than 1 month. °CAUSES  °Most people have bouts of hiccups from time to time. They start for no apparent reason, last a short while, and then stop. Sometimes they are due to: °· A temporary swollen stomach caused by overeating or eating too fast, eating spicy foods, drinking fizzy drinks, or swallowing air. °· A sudden change in temperature (very hot or cold foods or drinks, a cold shower). °· Drinking alcohol or using tobacco. °There are no particular tests used to diagnose hiccups. Hiccups are usually considered harmless and do not point to a serious medical condition. However, there can be underlying medical problems that may cause hiccups, such as pneumonia, diabetes, metabolic problems, tumors, abdominal infections or injuries, and neurologic problems. You must follow up with your caregiver if your symptoms persist or become a frequent problem. °TREATMENT  °· Most cases need no treatment. A bout of benign hiccups usually does not last long. °· Medicine is sometimes needed to stop persistent hiccups. Medicine may be given intravenously (IV) or by mouth. °· Hypnosis or acupuncture may be suggested. °· Surgery to affect the nerve that supplies the diaphragm may be tried in severe cases. °· Treatment of an underlying cause is needed in some cases. °HOME CARE INSTRUCTIONS  °Popular remedies that may stop a bout of hiccups include: °· Gargling ice  water. °· Swallowing granulated sugar. °· Biting on a lemon. °· Holding your breath, breathing fast, or breathing into a paper bag. °· Bearing down. °· Gasping after a sudden fright. °· Pulling your tongue gently. °· Distraction. °SEEK MEDICAL CARE IF:  °· Hiccups last for more than 48 hours. °· You are given medicine but your hiccups do not get better. °· New symptoms show up. °· You cannot sleep or eat due to the hiccups. °· You have unexpected weight loss. °· You have trouble breathing or swallowing. °· You develop severe pain in your abdomen or other areas. °· You develop numbness, tingling, or weakness. °Document Released: 03/10/2001 Document Revised: 03/24/2011 Document Reviewed: 02/20/2010 °[employer]® Patient Information ©2014 [employer]. ° °

## 2013-01-23 NOTE — ED Notes (Signed)
Pt was seen here on 1/8 and diagnosed with bronchitis and intractable hiccups.  Pt states they gave him some liquid medicine for the hiccups and it went away for 1/2 day and then returned that night.  Pt reports chest tightness from all the hiccups.

## 2013-01-23 NOTE — ED Notes (Signed)
Pt states hiccups are gone after eating peanut butter, states he feels much better.

## 2013-01-23 NOTE — ED Notes (Addendum)
Pt c/o of hiccups at present. Denies chest pain. Respirations unlabored. Pt is currently up and walking around to help relieve symptoms. Vital signs stable. EDP notified. Pt states no relief with medication.

## 2013-03-25 ENCOUNTER — Emergency Department (INDEPENDENT_AMBULATORY_CARE_PROVIDER_SITE_OTHER)
Admission: EM | Admit: 2013-03-25 | Discharge: 2013-03-25 | Disposition: A | Discharge status: Home / Self Care | Payer: Medicare Other | Source: Home / Self Care | Attending: Family Medicine | Admitting: Family Medicine

## 2013-03-25 ENCOUNTER — Encounter (HOSPITAL_COMMUNITY): Payer: Self-pay | Admitting: Emergency Medicine

## 2013-03-25 DIAGNOSIS — IMO0001 Reserved for inherently not codable concepts without codable children: Secondary | ICD-10-CM | POA: Diagnosis not present

## 2013-03-25 DIAGNOSIS — S40861A Insect bite (nonvenomous) of right upper arm, initial encounter: Secondary | ICD-10-CM

## 2013-03-25 DIAGNOSIS — W57XXXA Bitten or stung by nonvenomous insect and other nonvenomous arthropods, initial encounter: Principal | ICD-10-CM

## 2013-03-25 MED ORDER — FLUTICASONE PROPIONATE 0.05 % EX CREA: TOPICAL_CREAM | Freq: Two times a day (BID) | CUTANEOUS | Status: DC

## 2013-03-25 NOTE — ED Notes (Signed)
Pt  Has  A  Large      Rash  Area  On r  Arm that  Itches         denys  Any  Known  Causative  Agent       States  The  Rash itches            Pt in  No  Acute  Distress      No  Angioedema

## 2013-03-25 NOTE — ED Provider Notes (Signed)
CSN: [id number]     Arrival date & time 03/25/13  1148 History   First MD Initiated Contact with Patient 03/25/13 1345     Chief Complaint  Patient presents with  . Rash   (Consider location/radiation/quality/duration/timing/severity/associated sxs/prior Treatment) Patient is a 73 y.o. male presenting with rash. The history is provided by the patient.  Rash Location:  Shoulder/arm Shoulder/arm rash location:  R forearm Quality: bruising, dryness and itchiness   Severity:  Mild Onset quality:  Gradual Duration:  3 weeks Progression:  Unchanged Chronicity:  New Context: insect bite/sting   Associated symptoms: no fever, no joint pain and no myalgias     Past Medical History  Diagnosis Date  . Hypertension   . Asthma   . Seasonal allergies    History reviewed. No pertinent past surgical history. History reviewed. No pertinent family history. History  Substance Use Topics  . Smoking status: Never Smoker   . Smokeless tobacco: Not on file  . Alcohol Use: No    Review of Systems  Constitutional: Negative.  Negative for fever.  Musculoskeletal: Negative for arthralgias and myalgias.  Skin: Positive for rash.    Allergies  Review of patient's allergies indicates no known allergies.  Home Medications   Current Outpatient Rx  Name  Route  Sig  Dispense  Refill  . acetaminophen (TYLENOL) 500 MG tablet   Oral   Take 1,000 mg by mouth every 6 (six) hours as needed for headache.         . albuterol (PROAIR HFA) 108 (90 BASE) MCG/ACT inhaler   Inhalation   Inhale 1 puff into the lungs every 4 (four) hours as needed for wheezing or shortness of breath.         Marland Kitchen amLODipine-benazepril (LOTREL) 10-20 MG per capsule   Oral   Take 1 capsule by mouth daily.         . beclomethasone (QVAR) 40 MCG/ACT inhaler   Inhalation   Inhale 1 puff into the lungs 2 (two) times daily as needed (shortness of breath).         Marland Kitchen dextromethorphan (DELSYM) 30 MG/5ML liquid   Oral   Take 10 mLs (60 mg total) by mouth 2 (two) times daily. For cough   148 mL   0   . fluticasone (CUTIVATE) 0.05 % cream   Topical   Apply topically 2 (two) times daily.   30 g   1   . ipratropium (ATROVENT) 0.06 % nasal spray   Nasal   Place 2 sprays into the nose 4 (four) times daily.   15 mL   1   . ipratropium-albuterol (DUONEB) 0.5-2.5 (3) MG/3ML SOLN   Nebulization   Take 3 mLs by nebulization 2 (two) times daily as needed (tightness).          BP 149/79  Pulse 87  Temp(Src) 96.6 F (35.9 C) (Oral)  Resp 16  SpO2 95% Physical Exam  Nursing note and vitals reviewed. Constitutional: He is oriented to person, place, and time. He appears well-developed and well-nourished.  Neck: Normal range of motion. Neck supple.  Musculoskeletal: Normal range of motion. He exhibits no tenderness.  Neurological: He is alert and oriented to person, place, and time.  Skin: Skin is warm and dry. Rash noted.  Dry ecchymosis x 3 to  Right extensor forearm. No sign of infection, nontender.    ED Course  Procedures (including critical care time) Labs Review Labs Reviewed - No data to display Imaging Review  No results found.   MDM   1. Insect bite of arm, right        Billy Fischer, MD 03/25/13 1404

## 2013-03-30 DIAGNOSIS — I10 Essential (primary) hypertension: Secondary | ICD-10-CM | POA: Diagnosis not present

## 2013-03-30 DIAGNOSIS — L2089 Other atopic dermatitis: Secondary | ICD-10-CM | POA: Diagnosis not present

## 2013-03-30 DIAGNOSIS — J45909 Unspecified asthma, uncomplicated: Secondary | ICD-10-CM | POA: Diagnosis not present

## 2013-03-30 DIAGNOSIS — Z23 Encounter for immunization: Secondary | ICD-10-CM | POA: Diagnosis not present

## 2013-03-30 DIAGNOSIS — J309 Allergic rhinitis, unspecified: Secondary | ICD-10-CM | POA: Diagnosis not present

## 2013-03-30 DIAGNOSIS — R7309 Other abnormal glucose: Secondary | ICD-10-CM | POA: Diagnosis not present

## 2013-09-15 ENCOUNTER — Encounter (HOSPITAL_COMMUNITY): Payer: Self-pay | Admitting: Emergency Medicine

## 2013-09-15 ENCOUNTER — Emergency Department (HOSPITAL_COMMUNITY)
Admission: EM | Admit: 2013-09-15 | Discharge: 2013-09-15 | Disposition: A | Discharge status: Home / Self Care | Payer: Medicare Other | Attending: Emergency Medicine | Admitting: Emergency Medicine

## 2013-09-15 ENCOUNTER — Emergency Department (HOSPITAL_COMMUNITY): Payer: Medicare Other

## 2013-09-15 DIAGNOSIS — IMO0002 Reserved for concepts with insufficient information to code with codable children: Secondary | ICD-10-CM | POA: Insufficient documentation

## 2013-09-15 DIAGNOSIS — J40 Bronchitis, not specified as acute or chronic: Secondary | ICD-10-CM

## 2013-09-15 DIAGNOSIS — J209 Acute bronchitis, unspecified: Secondary | ICD-10-CM | POA: Insufficient documentation

## 2013-09-15 DIAGNOSIS — R0602 Shortness of breath: Secondary | ICD-10-CM | POA: Insufficient documentation

## 2013-09-15 DIAGNOSIS — Z79899 Other long term (current) drug therapy: Secondary | ICD-10-CM | POA: Insufficient documentation

## 2013-09-15 DIAGNOSIS — R0789 Other chest pain: Secondary | ICD-10-CM | POA: Diagnosis not present

## 2013-09-15 DIAGNOSIS — I10 Essential (primary) hypertension: Secondary | ICD-10-CM | POA: Diagnosis not present

## 2013-09-15 DIAGNOSIS — J45901 Unspecified asthma with (acute) exacerbation: Secondary | ICD-10-CM | POA: Diagnosis not present

## 2013-09-15 LAB — CBC WITH DIFFERENTIAL/PLATELET
Basophils Absolute: 0 10*3/uL (ref 0.0–0.1)
Basophils Relative: 1 % (ref 0–1)
Eosinophils Absolute: 0.4 10*3/uL (ref 0.0–0.7)
Eosinophils Relative: 5 % (ref 0–5)
HEMATOCRIT: 38.2 % — AB (ref 39.0–52.0)
Hemoglobin: 12.6 g/dL — ABNORMAL LOW (ref 13.0–17.0)
LYMPHS ABS: 1.4 10*3/uL (ref 0.7–4.0)
Lymphocytes Relative: 16 % (ref 12–46)
MCH: 27.1 pg (ref 26.0–34.0)
MCHC: 33 g/dL (ref 30.0–36.0)
MCV: 82.2 fL (ref 78.0–100.0)
MONO ABS: 0.7 10*3/uL (ref 0.1–1.0)
MONOS PCT: 8 % (ref 3–12)
NEUTROS ABS: 6.1 10*3/uL (ref 1.7–7.7)
NEUTROS PCT: 70 % (ref 43–77)
Platelets: 171 10*3/uL (ref 150–400)
RBC: 4.65 MIL/uL (ref 4.22–5.81)
RDW: 15.3 % (ref 11.5–15.5)
WBC: 8.7 10*3/uL (ref 4.0–10.5)

## 2013-09-15 LAB — TROPONIN I: Troponin I: <0.3 ng/mL (ref <0.30)

## 2013-09-15 LAB — BASIC METABOLIC PANEL
Anion gap: 14 (ref 5–15)
BUN: 14 mg/dL (ref 6–23)
CHLORIDE: 105 meq/L (ref 96–112)
CO2: 24 mEq/L (ref 19–32)
Calcium: 9 mg/dL (ref 8.4–10.5)
Creatinine, Ser: 1.1 mg/dL (ref 0.50–1.35)
GFR calc Af Amer: 74 mL/min — ABNORMAL LOW (ref >90)
GFR calc non Af Amer: 64 mL/min — ABNORMAL LOW (ref >90)
Glucose, Bld: 103 mg/dL — ABNORMAL HIGH (ref 70–99)
POTASSIUM: 4.5 meq/L (ref 3.7–5.3)
Sodium: 143 mEq/L (ref 137–147)

## 2013-09-15 MED ORDER — SODIUM CHLORIDE 0.9 % IV SOLN
INTRAVENOUS | Status: DC
Start: 2013-09-15 — End: 2013-09-15

## 2013-09-15 MED ORDER — METHYLPREDNISOLONE SODIUM SUCC 125 MG IJ SOLR
125.0000 mg | Freq: Once | INTRAMUSCULAR | Status: AC
  Administered 2013-09-15: 125 mg via INTRAVENOUS
  Filled 2013-09-15: qty 2

## 2013-09-15 MED ORDER — ALBUTEROL (5 MG/ML) CONTINUOUS INHALATION SOLN
10.0000 mg/h | INHALATION_SOLUTION | Freq: Once | RESPIRATORY_TRACT | Status: AC
  Administered 2013-09-15: 10 mg/h via RESPIRATORY_TRACT
  Filled 2013-09-15: qty 20

## 2013-09-15 MED ORDER — SODIUM CHLORIDE 0.9 % IV BOLUS (SEPSIS)
500.0000 mL | Freq: Once | INTRAVENOUS | Status: AC
  Administered 2013-09-15: 500 mL via INTRAVENOUS

## 2013-09-15 MED ORDER — PREDNISONE 20 MG PO TABS: 20.0000 mg | ORAL_TABLET | Freq: Two times a day (BID) | ORAL | Status: DC

## 2013-09-15 MED ORDER — DOXYCYCLINE HYCLATE 100 MG PO CAPS: 100.0000 mg | ORAL_CAPSULE | Freq: Two times a day (BID) | ORAL | Status: DC

## 2013-09-15 NOTE — Discharge Instructions (Signed)

## 2013-09-15 NOTE — ED Notes (Addendum)
Pt. Reports developing a sore throat on Monday .  The sore throat is gone but pt. Now has a productive cough and weakness.  White /yellow sputum.  Pt. Denies any pain.  Pt. Having sob at rest and exertion.   Pt. Is alert and oriented  X3. Pt. Did a breathing treatment at home prior to coming.  Pt. Has asthma.

## 2013-09-15 NOTE — ED Provider Notes (Signed)
CSN: [id number]     Arrival date & time 09/15/13  0845 History   First MD Initiated Contact with Patient 09/15/13 [phone]     Chief Complaint  Patient presents with  . Shortness of Breath     (Consider location/radiation/quality/duration/timing/severity/associated sxs/prior Treatment) HPI  [name] [name] is a 73 y.o. male presents for evaluation of shortness of breath, cough, and mild chest tightness. Symptoms started several days ago. He is producing cough, productive of yellow sputum, without blood. He denies fever, chills, weakness, dizziness, nausea, or vomiting. He used his nebulizer once last night and once this morning, without relief. No recent exacerbations of bronchitis. No known sick contacts. There are no other known modifying factors.  Past Medical History  Diagnosis Date  . Hypertension   . Asthma   . Seasonal allergies    History reviewed. No pertinent past surgical history. No family history on file. History  Substance Use Topics  . Smoking status: Never Smoker   . Smokeless tobacco: Not on file  . Alcohol Use: No    Review of Systems  All other systems reviewed and are negative.     Allergies  Review of patient's allergies indicates no known allergies.  Home Medications   Prior to Admission medications   Medication Sig Start Date End Date Taking? Authorizing Provider  albuterol (PROAIR HFA) 108 (90 BASE) MCG/ACT inhaler Inhale 1-2 puffs into the lungs every 4 (four) hours as needed for wheezing or shortness of breath.    Yes Historical Provider, MD  amLODipine-benazepril (LOTREL) 10-20 MG per capsule Take 1 capsule by mouth daily.   Yes Historical Provider, MD  ipratropium-albuterol (DUONEB) 0.5-2.5 (3) MG/3ML SOLN Take 3 mLs by nebulization 2 (two) times daily as needed (tightness).   Yes Historical Provider, MD  doxycycline (VIBRAMYCIN) 100 MG capsule Take 1 capsule (100 mg total) by mouth 2 (two) times daily. 09/15/13   Richarda Blade, MD  predniSONE  (DELTASONE) 20 MG tablet Take 1 tablet (20 mg total) by mouth 2 (two) times daily. 09/15/13   Richarda Blade, MD   BP 127/71  Pulse 112  Temp(Src) 98.6 F (37 C) (Oral)  Resp 20  SpO2 90% Physical Exam  Nursing note and vitals reviewed. Constitutional: He is oriented to person, place, and time. He appears well-developed.  Elderly, robust  HENT:  Head: Normocephalic and atraumatic.  Right Ear: External ear normal.  Left Ear: External ear normal.  Eyes: Conjunctivae and EOM are normal. Pupils are equal, round, and reactive to light.  Neck: Normal range of motion and phonation normal. Neck supple.  Cardiovascular: Regular rhythm, normal heart sounds and intact distal pulses.   Tachycardia  Pulmonary/Chest: Effort normal and breath sounds normal. No respiratory distress. He has no rales. He exhibits no tenderness and no bony tenderness.  Decreased air movement, bilaterally, with scattered wheezing.  Abdominal: Soft. There is no tenderness.  Musculoskeletal: Normal range of motion. He exhibits no edema and no tenderness.  Neurological: He is alert and oriented to person, place, and time. No cranial nerve deficit or sensory deficit. He exhibits normal muscle tone. Coordination normal.  Skin: Skin is warm, dry and intact.  Psychiatric: He has a normal mood and affect. His behavior is normal. Judgment and thought content normal.    ED Course  Procedures (including critical care time)  Medications  0.9 %  sodium chloride infusion ( Intravenous Stopped 09/15/13 1203)  sodium chloride 0.9 % bolus 500 mL (0 mLs Intravenous Stopped 09/15/13  1202)  methylPREDNISolone sodium succinate (SOLU-MEDROL) 125 mg/2 mL injection 125 mg (125 mg Intravenous Given 09/15/13 0954)  albuterol (PROVENTIL,VENTOLIN) solution continuous neb (10 mg/hr Nebulization Given 09/15/13 0948)    Patient Vitals for the past 24 hrs:  BP Temp Temp src Pulse Resp SpO2  09/15/13 1300 127/71 mmHg - - 112 20 90 %  09/15/13 1200  120/68 mmHg - - 116 21 93 %  09/15/13 1141 - - - 77 32 100 %  09/15/13 1100 137/78 mmHg - - 112 26 95 %  09/15/13 1000 138/80 mmHg - - 112 25 97 %  09/15/13 0951 - - - 109 25 92 %  09/15/13 0855 159/74 mmHg 98.6 F (37 C) Oral 119 18 96 %    1:43 PM Reevaluation with update and discussion. After initial assessment and treatment, an updated evaluation reveals he feels better. Able to walk with normal O2 sat while on RA O2. Kahil Agner L   Labs Review Labs Reviewed  CBC WITH DIFFERENTIAL - Abnormal; Notable for the following:    Hemoglobin 12.6 (*)    HCT 38.2 (*)    All other components within normal limits  BASIC METABOLIC PANEL - Abnormal; Notable for the following:    Glucose, Bld 103 (*)    GFR calc non Af Amer 64 (*)    GFR calc Af Amer 74 (*)    All other components within normal limits  TROPONIN I    Imaging Review Dg Chest 2 View  09/15/2013   CLINICAL DATA:  Short of breath  EXAM: CHEST  2 VIEW  COMPARISON:  01/23/2013  FINDINGS: The heart size and mediastinal contours are within normal limits. Both lungs are clear. The visualized skeletal structures are unremarkable.  IMPRESSION: No active cardiopulmonary disease.   Electronically Signed   By: Franchot Gallo M.D.   On: 09/15/2013 09:20     EKG Interpretation   Date/Time:  Thursday September 15 2013 08:53:52 EDT Ventricular Rate:  120 PR Interval:  193 QRS Duration: 78 QT Interval:  294 QTC Calculation: 415 R Axis:   60 Text Interpretation:  Sinus tachycardia Nonspecific T abnormalities,  lateral leads Since last tracing rate faster and  lateral TWA is new  Confirmed by Eulis Foster  MD, Katalea Ucci ([id number]) on 09/15/2013 9:49:47 AM      MDM   Final diagnoses:  Bronchitis    Evaluation assessment bronchitis. No increased oxygen requirement. Doubt serious bacterial infection, metabolic instability, or ACS.   Nursing Notes Reviewed/ Care Coordinated Applicable Imaging Reviewed Interpretation of Laboratory Data  incorporated into ED treatment  The patient appears reasonably screened and/or stabilized for discharge and I doubt any other medical condition or other [facility] requiring further screening, evaluation, or treatment in the ED at this time prior to discharge.  Plan: Home Medications- Albuterol, Prednisone, Doxycycline; Home Treatments- rest; return here if the recommended treatment, does not improve the symptoms; Recommended follow up- PCP check up 1 week  Richarda Blade, MD 09/15/13 1345

## 2013-10-06 DIAGNOSIS — Z23 Encounter for immunization: Secondary | ICD-10-CM | POA: Diagnosis not present

## 2013-10-06 DIAGNOSIS — E669 Obesity, unspecified: Secondary | ICD-10-CM | POA: Diagnosis not present

## 2013-10-06 DIAGNOSIS — J45909 Unspecified asthma, uncomplicated: Secondary | ICD-10-CM | POA: Diagnosis not present

## 2013-10-06 DIAGNOSIS — Z1211 Encounter for screening for malignant neoplasm of colon: Secondary | ICD-10-CM | POA: Diagnosis not present

## 2013-10-06 DIAGNOSIS — I10 Essential (primary) hypertension: Secondary | ICD-10-CM | POA: Diagnosis not present

## 2013-10-06 DIAGNOSIS — J309 Allergic rhinitis, unspecified: Secondary | ICD-10-CM | POA: Diagnosis not present

## 2013-10-06 DIAGNOSIS — L2089 Other atopic dermatitis: Secondary | ICD-10-CM | POA: Diagnosis not present

## 2013-10-14 DIAGNOSIS — Z1211 Encounter for screening for malignant neoplasm of colon: Secondary | ICD-10-CM | POA: Diagnosis not present

## 2013-11-14 ENCOUNTER — Encounter (HOSPITAL_COMMUNITY): Payer: Self-pay | Admitting: Emergency Medicine

## 2013-11-14 ENCOUNTER — Emergency Department (HOSPITAL_COMMUNITY): Payer: Medicare Other

## 2013-11-14 ENCOUNTER — Emergency Department (HOSPITAL_COMMUNITY)
Admission: EM | Admit: 2013-11-14 | Discharge: 2013-11-14 | Disposition: A | Discharge status: Home / Self Care | Payer: Medicare Other | Attending: Emergency Medicine | Admitting: Emergency Medicine

## 2013-11-14 DIAGNOSIS — Z79899 Other long term (current) drug therapy: Secondary | ICD-10-CM | POA: Insufficient documentation

## 2013-11-14 DIAGNOSIS — Z792 Long term (current) use of antibiotics: Secondary | ICD-10-CM | POA: Diagnosis not present

## 2013-11-14 DIAGNOSIS — I10 Essential (primary) hypertension: Secondary | ICD-10-CM | POA: Insufficient documentation

## 2013-11-14 DIAGNOSIS — J45901 Unspecified asthma with (acute) exacerbation: Secondary | ICD-10-CM | POA: Insufficient documentation

## 2013-11-14 DIAGNOSIS — R509 Fever, unspecified: Secondary | ICD-10-CM | POA: Diagnosis not present

## 2013-11-14 DIAGNOSIS — R000 Tachycardia, unspecified: Secondary | ICD-10-CM | POA: Diagnosis not present

## 2013-11-14 DIAGNOSIS — R0602 Shortness of breath: Secondary | ICD-10-CM | POA: Diagnosis not present

## 2013-11-14 DIAGNOSIS — R05 Cough: Secondary | ICD-10-CM | POA: Diagnosis not present

## 2013-11-14 LAB — BASIC METABOLIC PANEL
Anion gap: 15 (ref 5–15)
BUN: 13 mg/dL (ref 6–23)
CHLORIDE: 101 meq/L (ref 96–112)
CO2: 24 meq/L (ref 19–32)
CREATININE: 1.06 mg/dL (ref 0.50–1.35)
Calcium: 9 mg/dL (ref 8.4–10.5)
GFR calc Af Amer: 78 mL/min — ABNORMAL LOW (ref >90)
GFR calc non Af Amer: 67 mL/min — ABNORMAL LOW (ref >90)
GLUCOSE: 104 mg/dL — AB (ref 70–99)
Potassium: 4.2 mEq/L (ref 3.7–5.3)
Sodium: 140 mEq/L (ref 137–147)

## 2013-11-14 LAB — CBC
HCT: 40.3 % (ref 39.0–52.0)
HEMOGLOBIN: 13.2 g/dL (ref 13.0–17.0)
MCH: 27.2 pg (ref 26.0–34.0)
MCHC: 32.8 g/dL (ref 30.0–36.0)
MCV: 83.1 fL (ref 78.0–100.0)
Platelets: 159 10*3/uL (ref 150–400)
RBC: 4.85 MIL/uL (ref 4.22–5.81)
RDW: 15 % (ref 11.5–15.5)
WBC: 5 10*3/uL (ref 4.0–10.5)

## 2013-11-14 LAB — TROPONIN I: Troponin I: <0.3 ng/mL (ref <0.30)

## 2013-11-14 LAB — PRO B NATRIURETIC PEPTIDE: Pro B Natriuretic peptide (BNP): 38.9 pg/mL (ref 0–125)

## 2013-11-14 MED ORDER — IPRATROPIUM-ALBUTEROL 0.5-2.5 (3) MG/3ML IN SOLN
3.0000 mL | Freq: Once | RESPIRATORY_TRACT | Status: AC
  Administered 2013-11-14: 3 mL via RESPIRATORY_TRACT
  Filled 2013-11-14: qty 3

## 2013-11-14 MED ORDER — PREDNISONE 20 MG PO TABS: ORAL_TABLET | ORAL | Status: DC

## 2013-11-14 MED ORDER — PREDNISONE 20 MG PO TABS
60.0000 mg | ORAL_TABLET | Freq: Once | ORAL | Status: AC
  Administered 2013-11-14: 60 mg via ORAL
  Filled 2013-11-14: qty 3

## 2013-11-14 NOTE — ED Notes (Signed)
Pt c/o cough x 3 days with SOB starting last night with body aches and chills

## 2013-11-14 NOTE — ED Notes (Signed)
Pt's oxygen saturation stayed between 97% and 98% while ambulating, then dropped to 92% upon return to room. Sats are currently at 96%.

## 2013-11-14 NOTE — ED Provider Notes (Signed)
CSN: [id number]     Arrival date & time 11/14/13  0865 History   First MD Initiated Contact with Patient 11/14/13 [phone]     Chief Complaint  Patient presents with  . Shortness of Breath     (Consider location/radiation/quality/duration/timing/severity/associated sxs/prior Treatment) HPI Comments: This is a 73 y/o male with a PMHx of asthma, HTN and seasonal allergies who presents to the ED from home with his wife complaining of shortness of breath beginning yesterday evening. Shortness of breath worse on exertion. Admits to associated dry cough x 3 days, chills, nasal congestion and generalized body aches. No fevers. Denies chest pain, n/v. He tried using his albuterol inhaler and nebulizer with minimal relief. He has not had the flu vaccine this year, however recently had pneumonia vaccine.  Patient is a 73 y.o. male presenting with shortness of breath. The history is provided by the patient.  Shortness of Breath Associated symptoms: cough     Past Medical History  Diagnosis Date  . Hypertension   . Asthma   . Seasonal allergies    History reviewed. No pertinent past surgical history. History reviewed. No pertinent family history. History  Substance Use Topics  . Smoking status: Never Smoker   . Smokeless tobacco: Not on file  . Alcohol Use: No    Review of Systems  Constitutional: Positive for chills.  HENT: Positive for congestion.   Respiratory: Positive for cough and shortness of breath.   Musculoskeletal: Positive for myalgias and arthralgias.  All other systems reviewed and are negative.     Allergies  Review of patient's allergies indicates no known allergies.  Home Medications   Prior to Admission medications   Medication Sig Start Date End Date Taking? Authorizing Provider  albuterol (PROAIR HFA) 108 (90 BASE) MCG/ACT inhaler Inhale 1-2 puffs into the lungs every 4 (four) hours as needed for wheezing or shortness of breath.    Yes Historical Provider, MD   amLODipine-benazepril (LOTREL) 10-20 MG per capsule Take 1 capsule by mouth daily.   Yes Historical Provider, MD  guaiFENesin-dextromethorphan (ROBITUSSIN DM) 100-10 MG/5ML syrup Take 10 mLs by mouth every 4 (four) hours as needed for cough.   Yes Historical Provider, MD  ipratropium-albuterol (DUONEB) 0.5-2.5 (3) MG/3ML SOLN Take 3 mLs by nebulization 2 (two) times daily as needed (tightness).   Yes Historical Provider, MD  doxycycline (VIBRAMYCIN) 100 MG capsule Take 1 capsule (100 mg total) by mouth 2 (two) times daily. 09/15/13   Richarda Blade, MD  predniSONE (DELTASONE) 20 MG tablet 2 tabs po daily x 4 days 11/14/13   Dandrae Kustra M Kiley Solimine, PA-C   BP 130/71 mmHg  Pulse 125  Temp(Src) 97.9 F (36.6 C) (Oral)  Resp 20  Ht 5\' 11"  (1.803 m)  Wt 250 lb (113.399 kg)  BMI 34.88 kg/m2  SpO2 97% Physical Exam  Constitutional: He is oriented to person, place, and time. He appears well-developed and well-nourished. No distress.  HENT:  Head: Normocephalic and atraumatic.  Mouth/Throat: Oropharynx is clear and moist.  Eyes: Conjunctivae and EOM are normal. Pupils are equal, round, and reactive to light.  Neck: Normal range of motion. Neck supple. No JVD present.  Cardiovascular: Regular rhythm, normal heart sounds and intact distal pulses.   Tachycardic. No extremity edema.  Pulmonary/Chest: Effort normal. No respiratory distress.  Diffuse wheezes bilateral with course breath sounds at bases.  Abdominal: Soft. Bowel sounds are normal. There is no tenderness.  Musculoskeletal: Normal range of motion. He exhibits no edema.  Neurological: He is alert and oriented to person, place, and time. He has normal strength. No sensory deficit.  Speech fluent, goal oriented. Moves limbs without ataxia. Equal grip strength bilateral.  Skin: Skin is warm and dry. He is not diaphoretic.  Psychiatric: He has a normal mood and affect. His behavior is normal.  Nursing note and vitals reviewed.   ED Course   Procedures (including critical care time) Labs Review Labs Reviewed  BASIC METABOLIC PANEL - Abnormal; Notable for the following:    Glucose, Bld 104 (*)    GFR calc non Af Amer 67 (*)    GFR calc Af Amer 78 (*)    All other components within normal limits  CBC  TROPONIN I  PRO B NATRIURETIC PEPTIDE    Imaging Review Dg Chest 2 View  11/14/2013   CLINICAL DATA:  Two day history of cough and fever  EXAM: CHEST  2 VIEW  COMPARISON:  September 15, 2013  FINDINGS: There is no edema or consolidation. The heart size and pulmonary vascularity are normal. No adenopathy. There is degenerative change in the thoracic spine.  IMPRESSION: No edema or consolidation.   Electronically Signed   By: Lowella Grip M.D.   On: 11/14/2013 09:35     EKG Interpretation   Date/Time:  Monday November 14 2013 08:43:59 EST Ventricular Rate:  133 PR Interval:  180 QRS Duration: 82 QT Interval:  262 QTC Calculation: 389 R Axis:   62 Text Interpretation:  Sinus tachycardia Nonspecific T wave abnormality  Abnormal ECG No significant change since last tracing Confirmed by  [place] ([id number]) on 11/14/2013 8:58:10 AM      MDM   Final diagnoses:  Shortness of breath  Asthma exacerbation   Patient presenting with shortness of breath, congestion and dry cough. He is nontoxic appearing and in no apparent distress. Afebrile, tachycardic, vitals otherwise stable. O2 sat 95% on room air. Breath sounds with diffuse wheezes and coarse at bases. Chest x-ray without any acute findings. Labs normal. Doubt cardiac, no chest pain, HEART score 3. Significant improvement after 2 DuoNeb treatments. Plan to give 60 mg PO prednisone and monitor pt.  11:51 AM Pt reports he is still feeling better. O2 sat 98-98% while ambulating. Breath sounds further improved after second DuoNeb. Stable for d/c. Will d/c with prednisone burst, continue albuterol neb and inhaler, f/u with PCP within 48 hours. Return precautions  given. Patient states understanding of treatment care plan and is agreeable.  Discussed with attending Dr. Christy Gentles who also evaluated patient and agrees with plan of care.    Carman Ching, PA-C 11/14/13 1153

## 2013-11-14 NOTE — ED Provider Notes (Signed)
Patient seen/examined in the Emergency Department in conjunction with Midlevel Provider [place] Patient reports cough/shortness of breath Exam : awake/alert, tachypneic after neb treatment Plan: will continue to monitor response to nebulizers   Sharyon Cable, MD 11/14/13 [phone]

## 2013-11-14 NOTE — Discharge Instructions (Signed)
Take prednisone as prescribed beginning tomorrow as you were given the first dose in the emergency department today. Continue using her nebulizer as needed for cough and wheezing. Follow-up with your primary care physician within 48 hours.  Asthma, Acute Bronchospasm Acute bronchospasm caused by asthma is also referred to as an asthma attack. Bronchospasm means your air passages become narrowed. The narrowing is caused by inflammation and tightening of the muscles in the air tubes (bronchi) in your lungs. This can make it hard to breathe or cause you to wheeze and cough. CAUSES Possible triggers are:  Animal dander from the skin, hair, or feathers of animals.  Dust mites contained in house dust.  Cockroaches.  Pollen from trees or grass.  Mold.  Cigarette or tobacco smoke.  Air pollutants such as dust, household cleaners, hair sprays, aerosol sprays, paint fumes, strong chemicals, or strong odors.  Cold air or weather changes. Cold air may trigger inflammation. Winds increase molds and pollens in the air.  Strong emotions such as crying or laughing hard.  Stress.  Certain medicines such as aspirin or beta-blockers.  Sulfites in foods and drinks, such as dried fruits and wine.  Infections or inflammatory conditions, such as a flu, cold, or inflammation of the nasal membranes (rhinitis).  Gastroesophageal reflux disease (GERD). GERD is a condition where stomach acid backs up into your esophagus.  Exercise or strenuous activity. SIGNS AND SYMPTOMS   Wheezing.  Excessive coughing, particularly at night.  Chest tightness.  Shortness of breath. DIAGNOSIS  Your health care provider will ask you about your medical history and perform a physical exam. A chest X-ray or blood testing may be performed to look for other causes of your symptoms or other conditions that may have triggered your asthma attack. TREATMENT  Treatment is aimed at reducing inflammation and opening up the  airways in your lungs. Most asthma attacks are treated with inhaled medicines. These include quick relief or rescue medicines (such as bronchodilators) and controller medicines (such as inhaled corticosteroids). These medicines are sometimes given through an inhaler or a nebulizer. Systemic steroid medicine taken by mouth or given through an IV tube also can be used to reduce the inflammation when an attack is moderate or severe. Antibiotic medicines are only used if a bacterial infection is present.  HOME CARE INSTRUCTIONS   Rest.  Drink plenty of liquids. This helps the mucus to remain thin and be easily coughed up. Only use caffeine in moderation and do not use alcohol until you have recovered from your illness.  Do not smoke. Avoid being exposed to secondhand smoke.  You play a critical role in keeping yourself in good health. Avoid exposure to things that cause you to wheeze or to have breathing problems.  Keep your medicines up-to-date and available. Carefully follow your health care provider's treatment plan.  Take your medicine exactly as prescribed.  When pollen or pollution is bad, keep windows closed and use an air conditioner or go to places with air conditioning.  Asthma requires careful medical care. See your health care provider for a follow-up as advised. If you are more than [redacted] weeks pregnant and you were prescribed any new medicines, let your obstetrician know about the visit and how you are doing. Follow up with your health care provider as directed.  After you have recovered from your asthma attack, make an appointment with your outpatient doctor to talk about ways to reduce the likelihood of future attacks. If you do not have a  doctor who manages your asthma, make an appointment with a primary care doctor to discuss your asthma. SEEK IMMEDIATE MEDICAL CARE IF:   You are getting worse.  You have trouble breathing. If severe, call your local emergency services (911 in the  [place]  You develop chest pain or discomfort.  You are vomiting.  You are not able to keep fluids down.  You are coughing up yellow, green, brown, or bloody sputum.  You have a fever and your symptoms suddenly get worse.  You have trouble swallowing. MAKE SURE YOU:   Understand these instructions.  Will watch your condition.  Will get help right away if you are not doing well or get worse. Document Released: 04/16/2006 Document Revised: 01/04/2013 Document Reviewed: 07/07/2012 [facility] Patient Information 2015 [place], [place]. This information is not intended to replace advice given to you by your health care provider. Make sure you discuss any questions you have with your health care provider.  Shortness of Breath Shortness of breath means you have trouble breathing. It could also mean that you have a medical problem. You should get immediate medical care for shortness of breath. CAUSES   Not enough oxygen in the air such as with high altitudes or a smoke-filled room.  Certain lung diseases, infections, or problems.  Heart disease or conditions, such as angina or heart failure.  Low red blood cells (anemia).  Poor physical fitness, which can cause shortness of breath when you exercise.  Chest or back injuries or stiffness.  Being overweight.  Smoking.  Anxiety, which can make you feel like you are not getting enough air. DIAGNOSIS  Serious medical problems can often be found during your physical exam. Tests may also be done to determine why you are having shortness of breath. Tests may include:  Chest X-rays.  Lung function tests.  Blood tests.  An electrocardiogram (ECG).  An ambulatory electrocardiogram. An ambulatory ECG records your heartbeat patterns over a 24-hour period.  Exercise testing.  A transthoracic echocardiogram (TTE). During echocardiography, sound waves are used to evaluate how blood flows through your heart.  A transesophageal  echocardiogram (TEE).  Imaging scans. Your health care provider may not be able to find a cause for your shortness of breath after your exam. In this case, it is important to have a follow-up exam with your health care provider as directed.  TREATMENT  Treatment for shortness of breath depends on the cause of your symptoms and can vary greatly. HOME CARE INSTRUCTIONS   Do not smoke. Smoking is a common cause of shortness of breath. If you smoke, ask for help to quit.  Avoid being around chemicals or things that may bother your breathing, such as paint fumes and dust.  Rest as needed. Slowly resume your usual activities.  If medicines were prescribed, take them as directed for the full length of time directed. This includes oxygen and any inhaled medicines.  Keep all follow-up appointments as directed by your health care provider. SEEK MEDICAL CARE IF:   Your condition does not improve in the time expected.  You have a hard time doing your normal activities even with rest.  You have any new symptoms. SEEK IMMEDIATE MEDICAL CARE IF:   Your shortness of breath gets worse.  You feel light-headed, faint, or develop a cough not controlled with medicines.  You start coughing up blood.  You have pain with breathing.  You have chest pain or pain in your arms, shoulders, or abdomen.  You have a  fever.  You are unable to walk up stairs or exercise the way you normally do. MAKE SURE YOU:  Understand these instructions.  Will watch your condition.  Will get help right away if you are not doing well or get worse. Document Released: 09/24/2000 Document Revised: 01/04/2013 Document Reviewed: 03/17/2011 [facility] Patient Information 2015 [place], [place]. This information is not intended to replace advice given to you by your health care provider. Make sure you discuss any questions you have with your health care provider.

## 2014-01-09 ENCOUNTER — Encounter (HOSPITAL_COMMUNITY): Payer: Self-pay | Admitting: Emergency Medicine

## 2014-01-09 ENCOUNTER — Inpatient Hospital Stay (HOSPITAL_COMMUNITY)
Admission: EM | Admit: 2014-01-09 | Discharge: 2014-01-10 | DRG: 202 | Disposition: A | Discharge status: Home / Self Care | Payer: Medicare Other | Attending: Internal Medicine | Admitting: Internal Medicine

## 2014-01-09 ENCOUNTER — Emergency Department (HOSPITAL_COMMUNITY): Payer: Medicare Other

## 2014-01-09 DIAGNOSIS — J4521 Mild intermittent asthma with (acute) exacerbation: Secondary | ICD-10-CM | POA: Diagnosis not present

## 2014-01-09 DIAGNOSIS — I10 Essential (primary) hypertension: Secondary | ICD-10-CM | POA: Diagnosis present

## 2014-01-09 DIAGNOSIS — J9602 Acute respiratory failure with hypercapnia: Secondary | ICD-10-CM | POA: Diagnosis not present

## 2014-01-09 DIAGNOSIS — IMO0001 Reserved for inherently not codable concepts without codable children: Secondary | ICD-10-CM | POA: Diagnosis present

## 2014-01-09 DIAGNOSIS — R0602 Shortness of breath: Secondary | ICD-10-CM | POA: Diagnosis not present

## 2014-01-09 DIAGNOSIS — J9600 Acute respiratory failure, unspecified whether with hypoxia or hypercapnia: Secondary | ICD-10-CM | POA: Diagnosis present

## 2014-01-09 DIAGNOSIS — J45901 Unspecified asthma with (acute) exacerbation: Secondary | ICD-10-CM | POA: Diagnosis not present

## 2014-01-09 DIAGNOSIS — J302 Other seasonal allergic rhinitis: Secondary | ICD-10-CM | POA: Diagnosis present

## 2014-01-09 DIAGNOSIS — I517 Cardiomegaly: Secondary | ICD-10-CM | POA: Diagnosis not present

## 2014-01-09 DIAGNOSIS — R069 Unspecified abnormalities of breathing: Secondary | ICD-10-CM | POA: Diagnosis not present

## 2014-01-09 DIAGNOSIS — J4520 Mild intermittent asthma, uncomplicated: Secondary | ICD-10-CM | POA: Diagnosis present

## 2014-01-09 LAB — CBC WITH DIFFERENTIAL/PLATELET
BASOS ABS: 0 10*3/uL (ref 0.0–0.1)
Basophils Relative: 0 % (ref 0–1)
Eosinophils Absolute: 0.9 10*3/uL — ABNORMAL HIGH (ref 0.0–0.7)
Eosinophils Relative: 8 % — ABNORMAL HIGH (ref 0–5)
HCT: 40.1 % (ref 39.0–52.0)
Hemoglobin: 12.6 g/dL — ABNORMAL LOW (ref 13.0–17.0)
LYMPHS ABS: 4.9 10*3/uL — AB (ref 0.7–4.0)
LYMPHS PCT: 44 % (ref 12–46)
MCH: 26.3 pg (ref 26.0–34.0)
MCHC: 31.4 g/dL (ref 30.0–36.0)
MCV: 83.7 fL (ref 78.0–100.0)
Monocytes Absolute: 0.7 10*3/uL (ref 0.1–1.0)
Monocytes Relative: 6 % (ref 3–12)
NEUTROS PCT: 42 % — AB (ref 43–77)
Neutro Abs: 4.8 10*3/uL (ref 1.7–7.7)
Platelets: 202 10*3/uL (ref 150–400)
RBC: 4.79 MIL/uL (ref 4.22–5.81)
RDW: 14.9 % (ref 11.5–15.5)
WBC: 11.3 10*3/uL — AB (ref 4.0–10.5)

## 2014-01-09 LAB — GLUCOSE, CAPILLARY
Glucose-Capillary: 123 mg/dL — ABNORMAL HIGH (ref 70–99)
Glucose-Capillary: 144 mg/dL — ABNORMAL HIGH (ref 70–99)

## 2014-01-09 LAB — CBG MONITORING, ED
Glucose-Capillary: 162 mg/dL — ABNORMAL HIGH (ref 70–99)
Glucose-Capillary: 124 mg/dL — ABNORMAL HIGH (ref 70–99)

## 2014-01-09 LAB — COMPREHENSIVE METABOLIC PANEL
ALK PHOS: 149 U/L — AB (ref 39–117)
ALT: 22 U/L (ref 0–53)
AST: 28 U/L (ref 0–37)
Albumin: 4.3 g/dL (ref 3.5–5.2)
Anion gap: 7 (ref 5–15)
BUN: 20 mg/dL (ref 6–23)
CO2: 27 mmol/L (ref 19–32)
Calcium: 9.1 mg/dL (ref 8.4–10.5)
Chloride: 106 mEq/L (ref 96–112)
Creatinine, Ser: 1.07 mg/dL (ref 0.50–1.35)
GFR calc Af Amer: 77 mL/min — ABNORMAL LOW (ref >90)
GFR, EST NON AFRICAN AMERICAN: 66 mL/min — AB (ref >90)
GLUCOSE: 194 mg/dL — AB (ref 70–99)
Potassium: 3.9 mmol/L (ref 3.5–5.1)
SODIUM: 140 mmol/L (ref 135–145)
TOTAL PROTEIN: 8.5 g/dL — AB (ref 6.0–8.3)
Total Bilirubin: 0.4 mg/dL (ref 0.3–1.2)

## 2014-01-09 LAB — I-STAT ARTERIAL BLOOD GAS, ED
Acid-base deficit: 2 mmol/L (ref 0.0–2.0)
Bicarbonate: 28.9 mEq/L — ABNORMAL HIGH (ref 20.0–24.0)
O2 SAT: 98 %
PH ART: 7.182 — AB (ref 7.350–7.450)
PO2 ART: 144 mmHg — AB (ref 80.0–100.0)
Patient temperature: 98.6
TCO2: 31 mmol/L (ref 0–100)
pCO2 arterial: 77.1 mmHg (ref 35.0–45.0)

## 2014-01-09 LAB — TROPONIN I

## 2014-01-09 LAB — BRAIN NATRIURETIC PEPTIDE: B Natriuretic Peptide: 27 pg/mL (ref 0.0–100.0)

## 2014-01-09 MED ORDER — LEVOFLOXACIN IN D5W 750 MG/150ML IV SOLN
750.0000 mg | INTRAVENOUS | Status: DC
  Administered 2014-01-09 – 2014-01-10 (×2): 750 mg via INTRAVENOUS
  Filled 2014-01-09 (×3): qty 150

## 2014-01-09 MED ORDER — ACETAMINOPHEN 325 MG PO TABS: 650.0000 mg | ORAL_TABLET | ORAL | Status: DC | PRN

## 2014-01-09 MED ORDER — LEVALBUTEROL HCL 0.63 MG/3ML IN NEBU
0.6300 mg | INHALATION_SOLUTION | Freq: Four times a day (QID) | RESPIRATORY_TRACT | Status: DC
  Administered 2014-01-09 – 2014-01-10 (×5): 0.63 mg via RESPIRATORY_TRACT
  Filled 2014-01-09 (×12): qty 3

## 2014-01-09 MED ORDER — GUAIFENESIN-DM 100-10 MG/5ML PO SYRP
10.0000 mL | ORAL_SOLUTION | ORAL | Status: DC | PRN
Start: 2014-01-09 — End: 2014-01-10
  Filled 2014-01-09: qty 10

## 2014-01-09 MED ORDER — IPRATROPIUM BROMIDE 0.02 % IN SOLN
0.5000 mg | Freq: Four times a day (QID) | RESPIRATORY_TRACT | Status: DC
  Administered 2014-01-09 – 2014-01-10 (×5): 0.5 mg via RESPIRATORY_TRACT
  Filled 2014-01-09 (×5): qty 2.5

## 2014-01-09 MED ORDER — HEPARIN SODIUM (PORCINE) 5000 UNIT/ML IJ SOLN
5000.0000 [IU] | Freq: Three times a day (TID) | INTRAMUSCULAR | Status: DC
  Administered 2014-01-09 – 2014-01-10 (×3): 5000 [IU] via SUBCUTANEOUS
  Filled 2014-01-09 (×4): qty 1

## 2014-01-09 MED ORDER — METHYLPREDNISOLONE SODIUM SUCC 125 MG IJ SOLR
60.0000 mg | Freq: Four times a day (QID) | INTRAMUSCULAR | Status: DC
  Administered 2014-01-09 – 2014-01-10 (×4): 60 mg via INTRAVENOUS
  Filled 2014-01-09: qty 2
  Filled 2014-01-09 (×3): qty 0.96
  Filled 2014-01-09: qty 2
  Filled 2014-01-09 (×2): qty 0.96

## 2014-01-09 MED ORDER — AMLODIPINE BESYLATE 10 MG PO TABS
10.0000 mg | ORAL_TABLET | Freq: Every day | ORAL | Status: DC
  Administered 2014-01-09 – 2014-01-10 (×2): 10 mg via ORAL
  Filled 2014-01-09: qty 1
  Filled 2014-01-09: qty 2

## 2014-01-09 MED ORDER — SODIUM CHLORIDE 0.9 % IV SOLN
INTRAVENOUS | Status: DC
  Administered 2014-01-09: 50 mL/h via INTRAVENOUS
  Administered 2014-01-10: 01:00:00 via INTRAVENOUS

## 2014-01-09 MED ORDER — PANTOPRAZOLE SODIUM 40 MG PO TBEC
40.0000 mg | DELAYED_RELEASE_TABLET | Freq: Every day | ORAL | Status: DC
  Administered 2014-01-09 – 2014-01-10 (×2): 40 mg via ORAL
  Filled 2014-01-09 (×2): qty 1

## 2014-01-09 MED ORDER — INSULIN ASPART 100 UNIT/ML ~~LOC~~ SOLN
0.0000 [IU] | Freq: Three times a day (TID) | SUBCUTANEOUS | Status: DC
  Administered 2014-01-09 – 2014-01-10 (×2): 2 [IU] via SUBCUTANEOUS
  Filled 2014-01-09: qty 1

## 2014-01-09 MED ORDER — ALBUTEROL (5 MG/ML) CONTINUOUS INHALATION SOLN
INHALATION_SOLUTION | RESPIRATORY_TRACT | Status: AC
  Administered 2014-01-09: 03:00:00
  Filled 2014-01-09: qty 40

## 2014-01-09 MED ORDER — METHYLPREDNISOLONE SODIUM SUCC 125 MG IJ SOLR
125.0000 mg | Freq: Once | INTRAMUSCULAR | Status: AC
  Administered 2014-01-09: 125 mg via INTRAVENOUS
  Filled 2014-01-09: qty 2

## 2014-01-09 MED ORDER — LEVALBUTEROL HCL 0.63 MG/3ML IN NEBU
0.6300 mg | INHALATION_SOLUTION | RESPIRATORY_TRACT | Status: DC | PRN
  Administered 2014-01-09: 0.63 mg via RESPIRATORY_TRACT
  Filled 2014-01-09 (×2): qty 3

## 2014-01-09 NOTE — ED Provider Notes (Signed)
CSN: [id number]     Arrival date & time 01/09/14  2202 History  This chart was scribed for Julianne Rice, MD by Peyton Bottoms, ED Scribe. This patient was seen in room A01C/A01C and the patient's care was started at 2:46 AM.  Chief Complaint  Patient presents with  . Respiratory Distress   Patient is a 73 y.o. male presenting with shortness of breath. The history is provided by the patient. No language interpreter was used.  Shortness of Breath Severity:  Moderate Timing:  Constant Worsened by:  Nothing tried Ineffective treatments:  None tried Associated symptoms: cough, sputum production (clear) and wheezing   Associated symptoms: no abdominal pain, no chest pain, no fever, no rash and no vomiting    HPI Comments: [name] [name] is a 73 y.o. male with a history of hypertension, asthma and seasonal allergies, brought in by EMS, who presents to the Emergency Department complaining of respiratory distress and shortness of breath that awoke him from sleep at 2 AM. He reports associated cough with clear sputum, and sensation of hot flashes. He states that he feels better after initial nebulized treatment by EMS. Patient denies associated chest pain, abdominal pain, swelling of legs, fever, or chills. Patient was in his normal state of health prior to going to bed last night.  Past Medical History  Diagnosis Date  . Hypertension   . Asthma   . Seasonal allergies    History reviewed. No pertinent past surgical history. History reviewed. No pertinent family history. History  Substance Use Topics  . Smoking status: Never Smoker   . Smokeless tobacco: Never Used  . Alcohol Use: No   Review of Systems  Constitutional: Negative for fever.  Respiratory: Positive for cough, sputum production (clear), shortness of breath and wheezing.   Cardiovascular: Negative for chest pain, palpitations and leg swelling.  Gastrointestinal: Negative for nausea, vomiting, abdominal pain and diarrhea.   Skin: Negative for rash and wound.  Neurological: Negative for dizziness, weakness, light-headedness and numbness.  All other systems reviewed and are negative.  Allergies  Review of patient's allergies indicates no known allergies.  Home Medications   Prior to Admission medications   Medication Sig Start Date End Date Taking? Authorizing Provider  albuterol (PROAIR HFA) 108 (90 BASE) MCG/ACT inhaler Inhale 1-2 puffs into the lungs every 4 (four) hours as needed for wheezing or shortness of breath.    Yes Historical Provider, MD  amLODipine-benazepril (LOTREL) 10-20 MG per capsule Take 1 capsule by mouth daily.   Yes Historical Provider, MD  Chlorphen-Pseudoephed-APAP (CORICIDIN D PO) Take 2 tablets by mouth daily as needed (cold).   Yes Historical Provider, MD  guaiFENesin-dextromethorphan (ROBITUSSIN DM) 100-10 MG/5ML syrup Take 10 mLs by mouth every 4 (four) hours as needed for cough.   Yes Historical Provider, MD  ipratropium-albuterol (DUONEB) 0.5-2.5 (3) MG/3ML SOLN Take 3 mLs by nebulization 2 (two) times daily as needed (tightness).   Yes Historical Provider, MD  doxycycline (VIBRAMYCIN) 100 MG capsule Take 1 capsule (100 mg total) by mouth 2 (two) times daily. Patient not taking: Reported on 01/09/2014 09/15/13   Richarda Blade, MD  predniSONE (DELTASONE) 20 MG tablet 2 tabs po daily x 4 days Patient not taking: Reported on 01/09/2014 11/14/13   Carman Ching, PA-C   Triage Vitals: BP 155/92 mmHg  Pulse 135  Temp(Src) 97.8 F (36.6 C) (Oral)  Resp 26  Ht 5\' 8"  (1.727 m)  Wt 250 lb (113.399 kg)  BMI 38.02 kg/m2  SpO2 97%  Physical Exam  Constitutional: He is oriented to person, place, and time. He appears well-developed and well-nourished. No distress.  HENT:  Head: Normocephalic and atraumatic.  Mouth/Throat: Oropharynx is clear and moist.  Eyes: Conjunctivae and EOM are normal. Pupils are equal, round, and reactive to light.  Neck: Normal range of motion. Neck supple. No  tracheal deviation present.  Cardiovascular: Normal rate and regular rhythm.   Pulmonary/Chest: No respiratory distress. He has no wheezes. He has no rales.  Prolonged expiratory phase. Decreased air movement especially in the left lung fields. Increased respiratory effort  Abdominal: Soft. Bowel sounds are normal. He exhibits no distension and no mass. There is no tenderness. There is no rebound and no guarding.  Musculoskeletal: Normal range of motion. He exhibits no edema or tenderness.  No calf swelling or tenderness.  Neurological: He is alert and oriented to person, place, and time.  Moves all extremities without deficit. Sensation is grossly intact.  Skin: Skin is warm and dry. No rash noted. No erythema.  Psychiatric: He has a normal mood and affect. His behavior is normal.  Nursing note and vitals reviewed.  ED Course  Procedures (including critical care time)  DIAGNOSTIC STUDIES: Oxygen Saturation is 97%, normal by my interpretation.    COORDINATION OF CARE: 3:03 AM- Discussed plans to order diagnostic lab work. Gave patient albuterol breathing treatment upon arrival. Pt advised of plan for treatment and pt agrees.  Labs Review Labs Reviewed  CBC WITH DIFFERENTIAL - Abnormal; Notable for the following:    WBC 11.3 (*)    Hemoglobin 12.6 (*)    Neutrophils Relative % 42 (*)    Lymphs Abs 4.9 (*)    Eosinophils Relative 8 (*)    Eosinophils Absolute 0.9 (*)    All other components within normal limits  COMPREHENSIVE METABOLIC PANEL - Abnormal; Notable for the following:    Glucose, Bld 194 (*)    Total Protein 8.5 (*)    Alkaline Phosphatase 149 (*)    GFR calc non Af Amer 66 (*)    GFR calc Af Amer 77 (*)    All other components within normal limits  I-STAT ARTERIAL BLOOD GAS, ED - Abnormal; Notable for the following:    pH, Arterial 7.182 (*)    pCO2 arterial 77.1 (*)    pO2, Arterial 144.0 (*)    Bicarbonate 28.9 (*)    All other components within normal limits   BRAIN NATRIURETIC PEPTIDE  TROPONIN I  BLOOD GAS, ARTERIAL  BLOOD GAS, ARTERIAL   Imaging Review Dg Chest Port 1 View  01/09/2014   CLINICAL DATA:  Shortness of breath  EXAM: PORTABLE CHEST - 1 VIEW  COMPARISON:  11/14/2013  FINDINGS: There is exclusion of the extreme lateral right lower chest, which should not obscures significant pulmonary pathology. This area was visualized in November 2015. Stable normal heart size and mild aortic tortuosity. There is no edema, consolidation, effusion, or pneumothorax. No acute osseous findings.  IMPRESSION: No active disease.   Electronically Signed   By: Jorje Guild M.D.   On: 01/09/2014 03:52     EKG Interpretation None      Date: 01/09/2014  Rate: 120  Rhythm: sinus tachycardia  QRS Axis: normal  Intervals: normal  ST/T Wave abnormalities: normal  Conduction Disutrbances:none  Narrative Interpretation:   Old EKG Reviewed: unchanged  CRITICAL CARE Performed by: Lita Mains, Dearius Hoffmann Total critical care time: 30 min Critical care time was exclusive of separately billable procedures  and treating other patients. Critical care was necessary to treat or prevent imminent or life-threatening deterioration. Critical care was time spent personally by me on the following activities: development of treatment plan with patient and/or surrogate as well as nursing, discussions with consultants, evaluation of patient's response to treatment, examination of patient, obtaining history from patient or surrogate, ordering and performing treatments and interventions, ordering and review of laboratory studies, ordering and review of radiographic studies, pulse oximetry and re-evaluation of patient's condition.   MDM   Final diagnoses:  SOB (shortness of breath)   Patient given albuterol and Solu-Medrol in the emergency department. Improvement of air movement in all lung fields. Expiratory wheezing heard throughout. ABG shows significant respiratory acidosis.  Patient was started on BiPAP. He remains alert. We'll discussed with critical care regarding admission.  Discussed with critical care M.D. and will evaluate in the emergency department.   I personally performed the services described in this documentation, which was scribed in my presence. The recorded information has been reviewed and is accurate.  Julianne Rice, MD 01/10/14 Joen Laura

## 2014-01-09 NOTE — ED Notes (Signed)
Pt brought to ED by EMS from home on respiratory distress, pt has hx of asthma and HTN. No c/o of CP at this time. Pt was 80% on RA on EMS arrival. Pt got 17.5 mg of Albuterol and 1mg  of Atrovan. 97% on 10 L O2.

## 2014-01-09 NOTE — Progress Notes (Signed)
Pt taken off bipap and placed on 2.5lpm nasal cannula, no distress noted, bbs clear diminished in bases. Will continue to monitor.

## 2014-01-09 NOTE — ED Notes (Signed)
Notified RN CBG [id number]

## 2014-01-09 NOTE — Progress Notes (Signed)
ANTIBIOTIC CONSULT NOTE - INITIAL  Pharmacy Consult for Levaquin  Indication: Acute asthma exacerbation  No Known Allergies  Patient Measurements: Height: 5\' 8"  (172.7 cm) Weight: 250 lb (113.399 kg) IBW/kg (Calculated) : 68.4  Vital Signs: Temp: 97.8 F (36.6 C) (12/28 0248) Temp Source: Oral (12/28 0248) BP: 128/79 mmHg (12/28 0445) Pulse Rate: 115 (12/28 0445)  Labs:  Recent Labs  01/09/14 0304  WBC 11.3*  HGB 12.6*  PLT 202  CREATININE 1.07   Estimated Creatinine Clearance: 74 mL/min (by C-G formula based on Cr of 1.07).  Medical History: Past Medical History  Diagnosis Date  . Hypertension   . Asthma   . Seasonal allergies      Assessment: Levaquin for acute asthma exacerbation, WBC mildly elevated, renal function ok, other labs as above.   Plan:  -Levaquin 750 mg IV q24h -Trend WBC, temp, renal function  -PO/DC as able  Narda Bonds 01/09/2014,5:13 AM

## 2014-01-09 NOTE — H&P (Signed)
Name: [name] [name] MRN: [id number] DOB: 1940/02/12    ADMISSION DATE:  01/09/2014  REFERRING MD :  EDP  CHIEF COMPLAINT:  Dyspnea   BRIEF PATIENT DESCRIPTION: 72yo male with hx asthma, HTN presented 12/28 with acute onset SOB that woke him from sleep about 2am.  He had moderate respiratory distress, placed on bipap and PCCM called to admit.   SIGNIFICANT EVENTS    STUDIES:     HISTORY OF PRESENT ILLNESS:  73yo male with hx asthma, HTN presented 12/28 with acute onset SOB that woke him from sleep about 2am.  Pt also endorses approx 1 week increased cough, non productive.  Still c/o SOB but much improved with bipap.  Denies chest pain, orthopnea, hemoptysis, fever, n/v/d, recent sick contacts.     PAST MEDICAL HISTORY :   has a past medical history of Hypertension; Asthma; and Seasonal allergies.  has no past surgical history on file. Prior to Admission medications   Medication Sig Start Date End Date Taking? Authorizing Provider  albuterol (PROAIR HFA) 108 (90 BASE) MCG/ACT inhaler Inhale 1-2 puffs into the lungs every 4 (four) hours as needed for wheezing or shortness of breath.    Yes Historical Provider, MD  amLODipine-benazepril (LOTREL) 10-20 MG per capsule Take 1 capsule by mouth daily.   Yes Historical Provider, MD  Chlorphen-Pseudoephed-APAP (CORICIDIN D PO) Take 2 tablets by mouth daily as needed (cold).   Yes Historical Provider, MD  guaiFENesin-dextromethorphan (ROBITUSSIN DM) 100-10 MG/5ML syrup Take 10 mLs by mouth every 4 (four) hours as needed for cough.   Yes Historical Provider, MD  ipratropium-albuterol (DUONEB) 0.5-2.5 (3) MG/3ML SOLN Take 3 mLs by nebulization 2 (two) times daily as needed (tightness).   Yes Historical Provider, MD  doxycycline (VIBRAMYCIN) 100 MG capsule Take 1 capsule (100 mg total) by mouth 2 (two) times daily. Patient not taking: Reported on 01/09/2014 09/15/13   Richarda Blade, MD  predniSONE (DELTASONE) 20 MG tablet 2 tabs po daily x 4  days Patient not taking: Reported on 01/09/2014 11/14/13   Carman Ching, PA-C   No Known Allergies  FAMILY HISTORY:  family history is not on file. SOCIAL HISTORY:  reports that he has never smoked. He has never used smokeless tobacco. He reports that he does not drink alcohol or use illicit drugs.  REVIEW OF SYSTEMS:   As per HPI - All other systems reviewed and were neg.    SUBJECTIVE:   VITAL SIGNS: Temp:  [97.8 F (36.6 C)] 97.8 F (36.6 C) (12/28 0248) Pulse Rate:  [115-135] 115 (12/28 0445) Resp:  [11-29] 19 (12/28 0445) BP: (112-155)/(75-115) 128/79 mmHg (12/28 0445) SpO2:  [96 %-100 %] 97 % (12/28 0445) FiO2 (%):  [40 %] 40 % (12/28 0312) Weight:  [250 lb (113.399 kg)] 250 lb (113.399 kg) (12/28 0248)  PHYSICAL EXAMINATION: General:  Pleasant, chronically ill appearing male, NAD on bipap  Neuro:  Awake, alert, appropriate, MAE  HEENT:  Mm dry, bipap Cardiovascular:  s1s2 rrr, mild tachy  Lungs:  resps even, non labored on bipap, diminished, exp wheeze throughout  Abdomen:  Round, soft, +bs  Musculoskeletal:  Warm and dry, scant BLE edema    Recent Labs Lab 01/09/14 0304  NA 140  K 3.9  CL 106  CO2 27  BUN 20  CREATININE 1.07  GLUCOSE 194*    Recent Labs Lab 01/09/14 0304  HGB 12.6*  HCT 40.1  WBC 11.3*  PLT 202   Dg Chest [facility]  1 View  01/09/2014   CLINICAL DATA:  Shortness of breath  EXAM: PORTABLE CHEST - 1 VIEW  COMPARISON:  11/14/2013  FINDINGS: There is exclusion of the extreme lateral right lower chest, which should not obscures significant pulmonary pathology. This area was visualized in November 2015. Stable normal heart size and mild aortic tortuosity. There is no edema, consolidation, effusion, or pneumothorax. No acute osseous findings.  IMPRESSION: No active disease.   Electronically Signed   By: Jorje Guild M.D.   On: 01/09/2014 03:52    ASSESSMENT / PLAN:  Acute respiratory failure - r/t acute exacerbation of asthma  HTN    Asthma   PLAN -  Admit SDU  Cont bipap for now but change to PRN  Solumedrol 60mg  q 6h Levaquin - recent abx  BD's  Pulmonary hygiene  F/u CXR  Hold on further ABG for now with significant improvement Will order home amlodipine but hold benazapril (takes combo at home)  F/u cbc, chem in am  Will need to adjust home maintenance rx   Nickolas Madrid, NP 01/09/2014  5:07 AM Pager: [phone] or [phone]  Patient seen and examined, minimal wheezing on exam now.  Off BiPAP, down to 4L [place] with sats in the high 90's.  Appears very comfortable and WOB is normal.  Will continue abx, solumedrol and bronchodilator, maintain on SDU since just got off BiPAP.  Will check CXR in AM and if mental status deteriorates then will repeat ABG but no need for repeat for now.  Hold ACE for now but continue CCB.  May need to increase if hypertensive.  Patient seen and examined, agree with above note.  I dictated the care and orders written for this patient under my direction.  Rush Farmer, MD [phone]

## 2014-01-09 NOTE — Care Management Note (Signed)
    Page 1 of 1   01/09/2014     3:01:50 PM CARE MANAGEMENT NOTE 01/09/2014  Patient:  [name] [name], [name]   Account Number:  [id number]  Date Initiated:  01/09/2014  Documentation initiated by:  [name] [name] [name]  Subjective/Objective Assessment:   adm w resp distress     Action/Plan:   lives w wife, pcp dr [name] [name] [name]   Anticipated DC Date:     Anticipated DC Plan:           Choice offered to / List presented to:             Status of service:   Medicare Important Message given?   (If response is "NO", the following Medicare IM given date fields will be blank) Date Medicare IM given:   Medicare IM given by:   Date Additional Medicare IM given:   Additional Medicare IM given by:    Discharge Disposition:    Per UR Regulation:  Reviewed for med. necessity/level of care/duration of stay  If discussed at [place] of Stay Meetings, dates discussed:    Comments:

## 2014-01-09 NOTE — ED Notes (Signed)
Pt given chicken broth and water. 

## 2014-01-10 ENCOUNTER — Inpatient Hospital Stay (HOSPITAL_COMMUNITY): Payer: Medicare Other

## 2014-01-10 DIAGNOSIS — IMO0001 Reserved for inherently not codable concepts without codable children: Secondary | ICD-10-CM | POA: Diagnosis present

## 2014-01-10 DIAGNOSIS — R0602 Shortness of breath: Secondary | ICD-10-CM

## 2014-01-10 DIAGNOSIS — J4521 Mild intermittent asthma with (acute) exacerbation: Secondary | ICD-10-CM

## 2014-01-10 DIAGNOSIS — J4520 Mild intermittent asthma, uncomplicated: Secondary | ICD-10-CM | POA: Diagnosis present

## 2014-01-10 LAB — GLUCOSE, CAPILLARY
Glucose-Capillary: 134 mg/dL — ABNORMAL HIGH (ref 70–99)
Glucose-Capillary: 109 mg/dL — ABNORMAL HIGH (ref 70–99)
Glucose-Capillary: 345 mg/dL — ABNORMAL HIGH (ref 70–99)
Glucose-Capillary: 111 mg/dL — ABNORMAL HIGH (ref 70–99)

## 2014-01-10 LAB — HEMOGLOBIN A1C
Hgb A1c MFr Bld: 6 % — ABNORMAL HIGH (ref <5.7)
MEAN PLASMA GLUCOSE: 126 mg/dL — AB (ref <117)

## 2014-01-10 LAB — CBC
HEMATOCRIT: 37.4 % — AB (ref 39.0–52.0)
HEMOGLOBIN: 12.1 g/dL — AB (ref 13.0–17.0)
MCH: 26.6 pg (ref 26.0–34.0)
MCHC: 32.4 g/dL (ref 30.0–36.0)
MCV: 82.2 fL (ref 78.0–100.0)
Platelets: 228 10*3/uL (ref 150–400)
RBC: 4.55 MIL/uL (ref 4.22–5.81)
RDW: 15 % (ref 11.5–15.5)
WBC: 11.3 10*3/uL — ABNORMAL HIGH (ref 4.0–10.5)

## 2014-01-10 LAB — BASIC METABOLIC PANEL
Anion gap: 9 (ref 5–15)
BUN: 18 mg/dL (ref 6–23)
CHLORIDE: 105 meq/L (ref 96–112)
CO2: 25 mmol/L (ref 19–32)
Calcium: 9.2 mg/dL (ref 8.4–10.5)
Creatinine, Ser: 1 mg/dL (ref 0.50–1.35)
GFR calc Af Amer: 83 mL/min — ABNORMAL LOW (ref >90)
GFR, EST NON AFRICAN AMERICAN: 72 mL/min — AB (ref >90)
GLUCOSE: 149 mg/dL — AB (ref 70–99)
POTASSIUM: 4.4 mmol/L (ref 3.5–5.1)
Sodium: 139 mmol/L (ref 135–145)

## 2014-01-10 MED ORDER — FLUTICASONE PROPIONATE HFA 44 MCG/ACT IN AERO: 1.0000 | INHALATION_SPRAY | Freq: Two times a day (BID) | RESPIRATORY_TRACT | Status: DC

## 2014-01-10 MED ORDER — PREDNISONE 20 MG PO TABS
40.0000 mg | ORAL_TABLET | Freq: Every day | ORAL | Status: DC
  Administered 2014-01-10: 40 mg via ORAL
  Filled 2014-01-10 (×2): qty 2

## 2014-01-10 MED ORDER — FLUTICASONE PROPIONATE HFA 44 MCG/ACT IN AERO
1.0000 | INHALATION_SPRAY | Freq: Two times a day (BID) | RESPIRATORY_TRACT | Status: DC
  Filled 2014-01-10: qty 10.6

## 2014-01-10 MED ORDER — LEVOFLOXACIN 750 MG PO TABS: 750.0000 mg | ORAL_TABLET | Freq: Every day | ORAL | Status: AC

## 2014-01-10 MED ORDER — PREDNISONE 10 MG PO TABS: ORAL_TABLET | ORAL | Status: DC

## 2014-01-10 NOTE — Plan of Care (Signed)
Problem: Discharge Progression Outcomes Goal: Activity appropriate for discharge plan Outcome: Completed/Met Date Met:  01/10/14 Pt on low sodium, heart healthy diet.

## 2014-01-10 NOTE — Plan of Care (Signed)
Problem: Phase II Progression Outcomes Goal: Activity at appropriate level-compared to baseline (UP IN CHAIR FOR HEMODIALYSIS) Outcome: Completed/Met Date Met:  01/10/14 Pt tolerating up in chair and ambulating to bathroom/hallway.

## 2014-01-10 NOTE — Plan of Care (Signed)
Problem: Phase II Progression Outcomes Goal: ADLs completed with minimal assistance Outcome: Completed/Met Date Met:  01/10/14 Pt is completing ALDs independently with supervision.

## 2014-01-10 NOTE — Progress Notes (Signed)
Pt discharged home per MD orders. Reviewed and educated discharge instructions to pt, pt verbalizes understanding. Wife called and updated. Family friend at bedside to take pt home. Pt safely transported to vehicle with RN.

## 2014-01-10 NOTE — Discharge Summary (Signed)
Physician Discharge Summary       Patient ID: [name] [name] MRN: [id number] DOB/AGE: 74/11/1940 74 y.o.  Admit date: 01/09/2014 Discharge date: 01/10/2014  Discharge Diagnoses:  Active Problems:   Asthma exacerbation   Allergies   Detailed Hospital Course:   73yo male with hx asthma, HTN presented 12/28 with acute onset SOB that woke him from sleep about 2am. Asthma is described as adult onset and secondary to severe allergies. Pt also endorsed approx 1 week increased cough, non productive. Still c/o SOB but much improved with bipap. Denied chest pain, orthopnea, hemoptysis, fever, n/v/d, recent sick contacts. He was initially on BiPAP in the emergency department. He was treated for asthma exacerbation with the typical modalities including corticosteroids, scheduled bronchodilators, and antibiotics. He no longer required BiPAP shortly after admission. He had marked improvement in his symptoms and was deemed a candidate for discharge on December 29.   Discharge Plan by active problems   Acute respiratory failure - r/t acute exacerbation of asthma  Asthma  -Solumedrol > Prednisone 40mg  12/29 for short taper -Levaquin for 5 day course, last day 01/13/14 -PRN albuterol -Add fluticasone inhaler for maintanence -Ready for D/C next 24 hours.  -Pulmonary follow up for PFT, allergy evaluation  HTN -Resume home amlodipine/benazepril for now, may need to consider d/c ACE-i   Significant Hospital tests/ studies  Consults   Discharge Exam: BP 143/75 mmHg  Pulse 96  Temp(Src) 97.5 F (36.4 C) (Oral)  Resp 15  Ht 5\' 11"  (1.803 m)  Wt 106.9 kg (235 lb 10.8 oz)  BMI 32.88 kg/m2  SpO2 98%  General: Pleasant male NAD on RA.  Neuro: Awake, alert, appropriate, MAE  HEENT: [place]/AT, PERRL, no JVD noted Cardiovascular: s1s2 rrr, no MRG Lungs: resps even, non labored. No wheeze Abdomen: Round, soft, +bs  Musculoskeletal: Warm and dry, scant BLE edema   Labs at  discharge Lab Results  Component Value Date   CREATININE 1.00 01/10/2014   BUN 18 01/10/2014   NA 139 01/10/2014   K 4.4 01/10/2014   CL 105 01/10/2014   CO2 25 01/10/2014   Lab Results  Component Value Date   WBC 11.3* 01/10/2014   HGB 12.1* 01/10/2014   HCT 37.4* 01/10/2014   MCV 82.2 01/10/2014   PLT 228 01/10/2014   Lab Results  Component Value Date   ALT 22 01/09/2014   AST 28 01/09/2014   ALKPHOS 149* 01/09/2014   BILITOT 0.4 01/09/2014   No results found for: INR, PROTIME  Current radiology studies Dg Chest Port 1 View  01/10/2014   CLINICAL DATA:  Shortness of breath.  EXAM: PORTABLE CHEST - 1 VIEW  COMPARISON:  12/20/2013.  FINDINGS: Mediastinum hilar structures normal. Cardiomegaly with normal pulmonary vascularity. No focal pulmonary infiltrate. No pleural effusion or pneumothorax. No acute osseous abnormality.  IMPRESSION: 1. Mild cardiomegaly, no CHF.  2.  No acute pulmonary disease.   Electronically Signed   By: Marcello Moores  Register   On: 01/10/2014 07:50   Dg Chest Port 1 View  01/09/2014   CLINICAL DATA:  Shortness of breath  EXAM: PORTABLE CHEST - 1 VIEW  COMPARISON:  11/14/2013  FINDINGS: There is exclusion of the extreme lateral right lower chest, which should not obscures significant pulmonary pathology. This area was visualized in November 2015. Stable normal heart size and mild aortic tortuosity. There is no edema, consolidation, effusion, or pneumothorax. No acute osseous findings.  IMPRESSION: No active disease.   Electronically Signed   By: Roderic Palau  Watts M.D.   On: 01/09/2014 03:52    Disposition:  01-Home or Self Care     Medication List    ASK your doctor about these medications        amLODipine-benazepril 10-20 MG per capsule  Commonly known as:  LOTREL  Take 1 capsule by mouth daily.     CORICIDIN D PO  Take 2 tablets by mouth daily as needed (cold).     doxycycline 100 MG capsule  Commonly known as:  VIBRAMYCIN  Take 1 capsule (100  mg total) by mouth 2 (two) times daily.     guaiFENesin-dextromethorphan 100-10 MG/5ML syrup  Commonly known as:  ROBITUSSIN DM  Take 10 mLs by mouth every 4 (four) hours as needed for cough.     ipratropium-albuterol 0.5-2.5 (3) MG/3ML Soln  Commonly known as:  DUONEB  Take 3 mLs by nebulization 2 (two) times daily as needed (tightness).     predniSONE 20 MG tablet  Commonly known as:  DELTASONE  2 tabs po daily x 4 days     PROAIR HFA 108 (90 BASE) MCG/ACT inhaler  Generic drug:  albuterol  Inhale 1-2 puffs into the lungs every 4 (four) hours as needed for wheezing or shortness of breath.       Follow-up Information    Follow up with PARRETT,TAMMY, NP On 01/23/2014.   Specialty:  Nurse Practitioner   Why:  [facility] 11:15am    Contact information:   [address] [place] [address] [phone]       Discharged Condition: good  Georgann Housekeeper, AGACNP-BC [facility] Pager [phone] or [phone]    STAFF NOTE: Linwood Dibbles, MD FACP have personally reviewed patient's available data, including medical history, events of note, physical examination and test results as part of my evaluation. I have discussed with resident/NP and other care providers such as pharmacist, RN and RRT. In addition, I personally evaluated patient and elicited key findings of: fully examined, no more wheezing, no cough, no distress, will ambulate and check sats, I feel his strong h/o allergies from [place] are an issue here, will set up with Dr Annamaria Boots, likely needs long acting antihistamines, add inhaled steroids, may need follow up pft Dc home Lavon Paganini. Titus Mould, MD, [place] Pgr: [facility] 01/10/2014 1:32 PM

## 2014-01-10 NOTE — Plan of Care (Signed)
Problem: Phase I Progression Outcomes Goal: Progress activity as tolerated unless otherwise ordered Outcome: Completed/Met Date Met:  01/10/14 Pt out of bed and ambulated in hallway, maintaining O2 sats >92% at room air

## 2014-01-10 NOTE — Plan of Care (Addendum)
Problem: Phase I Progression Outcomes Goal: O2 sats > or equal 90% or at baseline Outcome: Completed/Met Date Met:  01/10/14 96-100% room air at rest

## 2014-01-23 ENCOUNTER — Ambulatory Visit (INDEPENDENT_AMBULATORY_CARE_PROVIDER_SITE_OTHER): Payer: Medicare Other | Admitting: Adult Health

## 2014-01-23 ENCOUNTER — Encounter: Payer: Self-pay | Admitting: Adult Health

## 2014-01-23 VITALS — BP 134/72 | HR 79 | Temp 98.1°F | Ht 71.5 in | Wt 241.8 lb

## 2014-01-23 DIAGNOSIS — J45901 Unspecified asthma with (acute) exacerbation: Secondary | ICD-10-CM

## 2014-01-23 NOTE — Progress Notes (Signed)
Subjective:    Patient ID: [name] [name], male    DOB: 04/06/1940, 74 y.o.   MRN: [id number]  HPI 74 yo male never smoker with Asthma  And AR admitted 01/09/14 for severe asthma exacerbation with PCCM admit  01/23/2014 [facility] follow up  Patient returns for a post hospital follow-up. This is his initial pulmonary office visit He was admitted December 28 through December 29 by critical care team for severe asthmatic exacerbation with acute respiratory failure. He did require initial BiPAP support. Initial PCO2 77.  He was treated with IV steroids and antibiotics, and transitioned to Levaquin and prednisone taper. At discharge. He was started on Flovent inhaler prior to discharge. His ACE inhibitor was held during admission and restarted at discharge. Has been on ACE for years.  Says he has only had to use rescue inhaler or Duoneb in past for asthma flares. Never been on maintenance inhalers in past.  Previous allergy testing years ago was positive for grass allergies was never on allergy vaccine.  Has had 6 ER visits over last 2 years for respiratory issues.  No dx of OSA, denies snoring or daytime sleepiness.  CBC this admission showed normal eosinophils .  CXR was neg. BNP was nml . Cardiac enzymes neg.  Says since discharge, he is feeling much improved Denies any wheezing, shortness of breath or cough. Says he's only used his rescue inhaler 1 time since discharge.  Review of Systems Constitutional:   No  weight loss, night sweats,  Fevers, chills, fatigue, or  lassitude.  HEENT:   No headaches,  Difficulty swallowing,  Tooth/dental problems, or  Sore throat,                No sneezing, itching, ear ache,  +nasal congestion, post nasal drip,   CV:  No chest pain,  Orthopnea, PND, swelling in lower extremities, anasarca, dizziness, palpitations, syncope.   GI  No heartburn, indigestion, abdominal pain, nausea, vomiting, diarrhea, change in bowel habits, loss of appetite,  bloody stools.   Resp: No shortness of breath with exertion or at rest.  No excess mucus, no productive cough,  No non-productive cough,  No coughing up of blood.  No change in color of mucus.  No wheezing.  No chest wall deformity  Skin: no rash or lesions.  GU: no dysuria, change in color of urine, no urgency or frequency.  No flank pain, no hematuria   MS:  No joint pain or swelling.  No decreased range of motion.  No back pain.  Psych:  No change in mood or affect. No depression or anxiety.  No memory loss.         Objective:   Physical Exam GEN: A/Ox3; pleasant , NAD, well nourished , obese   HEENT:  [place]/AT,  EACs-clear, TMs-wnl, NOSE-clear, THROAT-clear, no lesions, no postnasal drip or exudate noted.   NECK:  Supple w/ fair ROM; no JVD; normal carotid impulses w/o bruits; no thyromegaly or nodules palpated; no lymphadenopathy.  RESP  Clear  P & A; w/o, wheezes/ rales/ or rhonchi.no accessory muscle use, no dullness to percussion No psuedowheezing   CARD:  RRR, no m/r/g  , no peripheral edema, pulses intact, no cyanosis or clubbing.  GI:   Soft & nt; nml bowel sounds; no organomegaly or masses detected.  Musco: Warm bil, no deformities or joint swelling noted.   Neuro: alert, no focal deficits noted.    Skin: Warm, no lesions or rashes  Assessment & Plan:

## 2014-01-23 NOTE — Patient Instructions (Signed)
Please discuss with your primary care physician regarding your Lotrel medication for your blood pressure as it may be causing your cough/wheezing to be worse.  Continue on Flovent 1 puff Twice daily  , rinse after use.  Follow up Dr. Annamaria Boots  In 6 weeks with PFT .  Please contact office for sooner follow up if symptoms do not improve or worsen or seek emergency care

## 2014-01-23 NOTE — Assessment & Plan Note (Signed)
Recent severe asthma exacerbation, now resolved. Workup was unrevealing with negative chest x-ray,  normal BNP, negative cardiac enzymes. CBC did not show an elevated eosinophil count . This admission. Patient did require BiPAP support initially with an elevated PCO2. Patient responded well to steroids, and nebulized bronchodilators, and appears to be back at baseline Would avoid ACE inhibitor's in this patient as he's had frequent emergency room visits and recurrent asthma exacerbations with cough and wheezing Patient will need further follow-up with PFTs and consideration of allergy testing Can consider IgE/Rast ttest on return -  Plan  Please discuss with your primary care physician regarding your Lotrel medication for your blood pressure as it may be causing your cough/wheezing to be worse.  Continue on Flovent 1 puff Twice daily  , rinse after use.  Follow up Dr. Annamaria Boots  In 6 weeks with PFT .  Please contact office for sooner follow up if symptoms do not improve or worsen or seek emergency care

## 2014-02-16 DIAGNOSIS — H2513 Age-related nuclear cataract, bilateral: Secondary | ICD-10-CM | POA: Diagnosis not present

## 2014-02-16 DIAGNOSIS — H5203 Hypermetropia, bilateral: Secondary | ICD-10-CM | POA: Diagnosis not present

## 2014-02-16 DIAGNOSIS — H25013 Cortical age-related cataract, bilateral: Secondary | ICD-10-CM | POA: Diagnosis not present

## 2014-02-16 DIAGNOSIS — H524 Presbyopia: Secondary | ICD-10-CM | POA: Diagnosis not present

## 2014-03-06 ENCOUNTER — Other Ambulatory Visit: Payer: Self-pay | Admitting: Internal Medicine

## 2014-03-06 DIAGNOSIS — R0600 Dyspnea, unspecified: Secondary | ICD-10-CM

## 2014-03-07 ENCOUNTER — Encounter: Payer: Self-pay | Admitting: Internal Medicine

## 2014-03-07 ENCOUNTER — Ambulatory Visit (INDEPENDENT_AMBULATORY_CARE_PROVIDER_SITE_OTHER): Payer: Medicare Other | Admitting: Internal Medicine

## 2014-03-07 ENCOUNTER — Other Ambulatory Visit: Payer: Medicare Other

## 2014-03-07 VITALS — BP 128/72 | HR 83 | Ht 72.0 in | Wt 246.0 lb

## 2014-03-07 DIAGNOSIS — J4520 Mild intermittent asthma, uncomplicated: Secondary | ICD-10-CM | POA: Diagnosis not present

## 2014-03-07 DIAGNOSIS — J309 Allergic rhinitis, unspecified: Secondary | ICD-10-CM

## 2014-03-07 DIAGNOSIS — IMO0001 Reserved for inherently not codable concepts without codable children: Secondary | ICD-10-CM

## 2014-03-07 DIAGNOSIS — J339 Nasal polyp, unspecified: Secondary | ICD-10-CM

## 2014-03-07 DIAGNOSIS — R0600 Dyspnea, unspecified: Secondary | ICD-10-CM

## 2014-03-07 DIAGNOSIS — J45909 Unspecified asthma, uncomplicated: Secondary | ICD-10-CM | POA: Diagnosis not present

## 2014-03-07 LAB — PULMONARY FUNCTION TEST
DL/VA % pred: 95 %
DL/VA: 4.49 ml/min/mmHg/L
DLCO UNC % PRED: 86 %
DLCO UNC: 30.24 ml/min/mmHg
FEF 25-75 Post: 1.72 L/sec
FEF 25-75 Pre: 1.01 L/sec
FEF2575-%Change-Post: 71 %
FEF2575-%PRED-POST: 69 %
FEF2575-%Pred-Pre: 40 %
FEV1-%Change-Post: 21 %
FEV1-%Pred-Post: 89 %
FEV1-%Pred-Pre: 73 %
FEV1-Post: 2.72 L
FEV1-Pre: 2.24 L
FEV1FVC-%CHANGE-POST: 17 %
FEV1FVC-%PRED-PRE: 74 %
FEV6-%CHANGE-POST: 6 %
FEV6-%PRED-PRE: 97 %
FEV6-%Pred-Post: 103 %
FEV6-Post: 3.98 L
FEV6-Pre: 3.75 L
FEV6FVC-%CHANGE-POST: 2 %
FEV6FVC-%Pred-Post: 102 %
FEV6FVC-%Pred-Pre: 100 %
FVC-%CHANGE-POST: 3 %
FVC-%PRED-POST: 100 %
FVC-%Pred-Pre: 97 %
FVC-PRE: 3.94 L
FVC-Post: 4.07 L
PRE FEV6/FVC RATIO: 95 %
Post FEV1/FVC ratio: 67 %
Post FEV6/FVC ratio: 98 %
Pre FEV1/FVC ratio: 57 %

## 2014-03-07 NOTE — Progress Notes (Signed)
PFT done today. 

## 2014-03-07 NOTE — Assessment & Plan Note (Signed)
He hasn't recognized specific triggers other than weather changes. 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

## 2014-03-07 NOTE — Assessment & Plan Note (Signed)
Plan-aspirin IgE level looking for evidence of sensitivity not yet recognized by the patient, since this would indicate aspirin sensitivity triad. Start Flonase with education done.

## 2014-03-07 NOTE — Patient Instructions (Addendum)
Ok to continue using your current medicines as you have been.  Try adding otc Flonase/ fluticasone nasal spray  2 puffs in each nostril once daily at bedtime. See if this gradually shrinks back the polyps in your nose so your nose works better.   Order- lab- Allergy profile, Food IgE profile, alpha-gal IgE, Aspirin IgE    Dx allergic rhinitis, nasal polyps, asthma, moderate intermittent

## 2014-03-07 NOTE — Progress Notes (Signed)
03/07/14- 87 yoM never smoker FOLLOWS FOR: Pt saw TP 01/23/14 post hospital for  Severe asthma with AR exacerbation: He has PFT today and states breathing doing well. Pt denies cough , sob, wheeze and or chest tightness. 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  Perennial nasal congestion with some sneezing.  Does not wheeze most days. He blames the exacerbation requiring hospitalization on having large numbers of family staying in his house at the holidays. Asthma does not wake him. He has only needed to use his nebulizer once and is rescue inhaler twice since hospital discharge. Triggers especially include weather changes. He is less aware of lower tract wheezing and cough associated with colds, foods or aspirin. Denies history of pneumonia. No ENT surgery. 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 Cardiomegaly with normal pulmonary vascularity. No focal pulmonary infiltrate. No pleural effusion or pneumothorax. No acute osseous abnormality. IMPRESSION: 1. Mild cardiomegaly, no CHF. 2. No acute pulmonary disease. Electronically Signed  By: Marcello Moores Register  On: 01/10/2014 07:50 PFT: 03/07/2014-mild obstructive airways disease with significant response to bronchodilator, normal lung volumes, normal diffusion. FVC 4.07/100%, FEV1 2.7 to/89%, FEV1/FVC 0.67, FEF 25-75% 1.17/69%  Prior to Admission medications   Medication Sig Start Date End Date Taking? Authorizing Provider  albuterol (PROAIR HFA) 108 (90 BASE) MCG/ACT inhaler Inhale 1-2 puffs into the lungs every 4 (four) hours as needed for wheezing or shortness of breath.    Yes Historical Provider, MD  amLODipine-benazepril (LOTREL) 10-20 MG per capsule Take 1 capsule by mouth daily.   Yes Historical Provider, MD   Chlorphen-Pseudoephed-APAP (CORICIDIN D PO) Take 2 tablets by mouth daily as needed (cold).   Yes Historical Provider, MD  fluticasone (FLOVENT HFA) 44 MCG/ACT inhaler Inhale 1 puff into the lungs 2 (two) times daily. 01/10/14  Yes Corey Harold, NP  guaiFENesin-dextromethorphan (ROBITUSSIN DM) 100-10 MG/5ML syrup Take 10 mLs by mouth every 4 (four) hours as needed for cough.   Yes Historical Provider, MD  ipratropium-albuterol (DUONEB) 0.5-2.5 (3) MG/3ML SOLN Take 3 mLs by nebulization 2 (two) times daily as needed (tightness).   Yes Historical Provider, MD   Past Medical History  Diagnosis Date  . Hypertension   . Asthma   . Seasonal allergies    No past surgical history on file. No family history on file. History   Social History  . Marital Status: Married    Spouse Name: N/A  . Number of Children: N/A  . Years of Education: N/A   Occupational History  . Not on file.   Social History Main Topics  . Smoking status: Never Smoker   . Smokeless tobacco: Never Used  . Alcohol Use: No  . Drug Use: No  . Sexual Activity: Not on file   Other Topics Concern  . Not on file   Social History Narrative   ROS-see HPI   Negative unless "+" Constitutional:    weight loss, night sweats, fevers, chills, fatigue, lassitude. HEENT:    headaches, difficulty swallowing, tooth/dental problems, sore throat,      + sneezing, itching, ear ache, nasal congestion, post nasal drip, snoring CV:    chest pain, orthopnea, PND, swelling in lower extremities, anasarca,  dizziness, palpitations Resp:   +shortness of breath with exertion or at rest.                productive cough,   non-productive cough, coughing up of blood.              change in color of mucus.  wheezing.   Skin:    +rash or lesions. GI:  No-   heartburn, indigestion, abdominal pain, nausea, vomiting, diarrhea,                 change in bowel habits, loss of appetite GU: dysuria, change in color  of urine, no urgency or frequency.   flank pain. MS:   joint pain, stiffness, decreased range of motion, back pain. Neuro-     nothing unusual Psych:  change in mood or affect.  depression or anxiety.   memory loss.  OBJ- Physical Exam General- Alert, Oriented, Affect-appropriate, Distress- none acute Skin- rash-none, lesions- none, excoriation- none Lymphadenopathy- none Head- atraumatic            Eyes- Gross vision intact, PERRLA, conjunctivae and secretions clear            Ears- Hearing, canals-normal            Nose- + nasal polyps left greater than right, no-Septal dev, mucus,  erosion, perforation             Throat- Mallampati II , mucosa clear , drainage- none, tonsils- atrophic Neck- flexible , trachea midline, no stridor , thyroid nl, carotid no bruit Chest - symmetrical excursion , unlabored           Heart/CV- RRR , no murmur , no gallop  , no rub, nl s1 s2                           - JVD- none , edema- none, stasis changes- none, varices- none           Lung- clear to P&A, wheeze- none, cough- none , dullness-none, rub- none           Chest wall-  Abd-  Br/ Gen/ Rectal- Not done, not indicated Extrem- cyanosis- none, clubbing, none, atrophy- none, strength- nl Neuro- grossly intact to observation

## 2014-03-08 LAB — ALLERGEN FOOD PROFILE SPECIFIC IGE
Apple: <0.1 kU/L
Chicken IgE: <0.1 kU/L
Egg White IgE: <0.1 kU/L
Fish Cod: <0.1 kU/L
IGE (IMMUNOGLOBULIN E), SERUM: 178 kU/L — AB (ref <115)
Orange: <0.1 kU/L
Shrimp IgE: <0.1 kU/L
Tomato IgE: <0.1 kU/L
Wheat IgE: <0.1 kU/L

## 2014-03-08 LAB — ALLERGY FULL PROFILE
Bermuda Grass: <0.1 kU/L
Box Elder IgE: <0.1 kU/L
Candida Albicans: 0.3 kU/L — ABNORMAL HIGH
Cat Dander: <0.1 kU/L
Common Ragweed: <0.1 kU/L
Dog Dander: <0.1 kU/L
Elm IgE: <0.1 kU/L
Fescue: <0.1 kU/L
G005 Rye, Perennial: <0.1 kU/L
G009 Red Top: <0.1 kU/L
House Dust Hollister: <0.1 kU/L
Oak: <0.1 kU/L
Stemphylium Botryosum: <0.1 kU/L
Timothy Grass: <0.1 kU/L

## 2014-03-10 LAB — ALPHA GAL IGE: Alpha Gal IgE*: <0.1 kU/L (ref <0.35)

## 2014-03-14 LAB — ALLERGEN ASPIRIN/SALICYLIC ACID IGE
Aspirin: 0 IU/mL (ref <0.05)
Class: NEGATIVE

## 2014-03-15 ENCOUNTER — Telehealth: Payer: Self-pay | Admitting: Internal Medicine

## 2014-03-15 NOTE — Telephone Encounter (Signed)
Notes Recorded by Doroteo Glassman, RN on 03/15/2014 at 10:16 AM PT informed of lab results per Dr Annamaria Boots Notes Recorded by Glean Hess, CMA on 03/14/2014 at 5:11 PM [email or web address] Notes Recorded by Deneise Lever, MD on 03/14/2014 at 11:26 AM Our allergy labs don't show any elevation of allergy antibodies for common environmental or food triggers, or aspirin. That makes dangerous allergic reactions very unlikely.  -------------------------------------  Langley Gauss has already spoken to pt about this this morning.  Nothing further needed.

## 2014-04-11 DIAGNOSIS — J309 Allergic rhinitis, unspecified: Secondary | ICD-10-CM | POA: Diagnosis not present

## 2014-04-11 DIAGNOSIS — E669 Obesity, unspecified: Secondary | ICD-10-CM | POA: Diagnosis not present

## 2014-04-11 DIAGNOSIS — I10 Essential (primary) hypertension: Secondary | ICD-10-CM | POA: Diagnosis not present

## 2014-04-11 DIAGNOSIS — L309 Dermatitis, unspecified: Secondary | ICD-10-CM | POA: Diagnosis not present

## 2014-04-11 DIAGNOSIS — E780 Pure hypercholesterolemia: Secondary | ICD-10-CM | POA: Diagnosis not present

## 2014-04-11 DIAGNOSIS — Z6833 Body mass index (BMI) 33.0-33.9, adult: Secondary | ICD-10-CM | POA: Diagnosis not present

## 2014-04-11 DIAGNOSIS — J4530 Mild persistent asthma, uncomplicated: Secondary | ICD-10-CM | POA: Diagnosis not present

## 2014-05-08 ENCOUNTER — Ambulatory Visit: Payer: Medicare Other | Admitting: Internal Medicine

## 2014-05-29 IMAGING — CR DG CHEST 2V
2 series · 2 of 2 positions shown · non-contrast
Comparison: 01/20/2013

CLINICAL DATA: Congestion

EXAM:
CHEST  2 VIEW

[w chest pa]
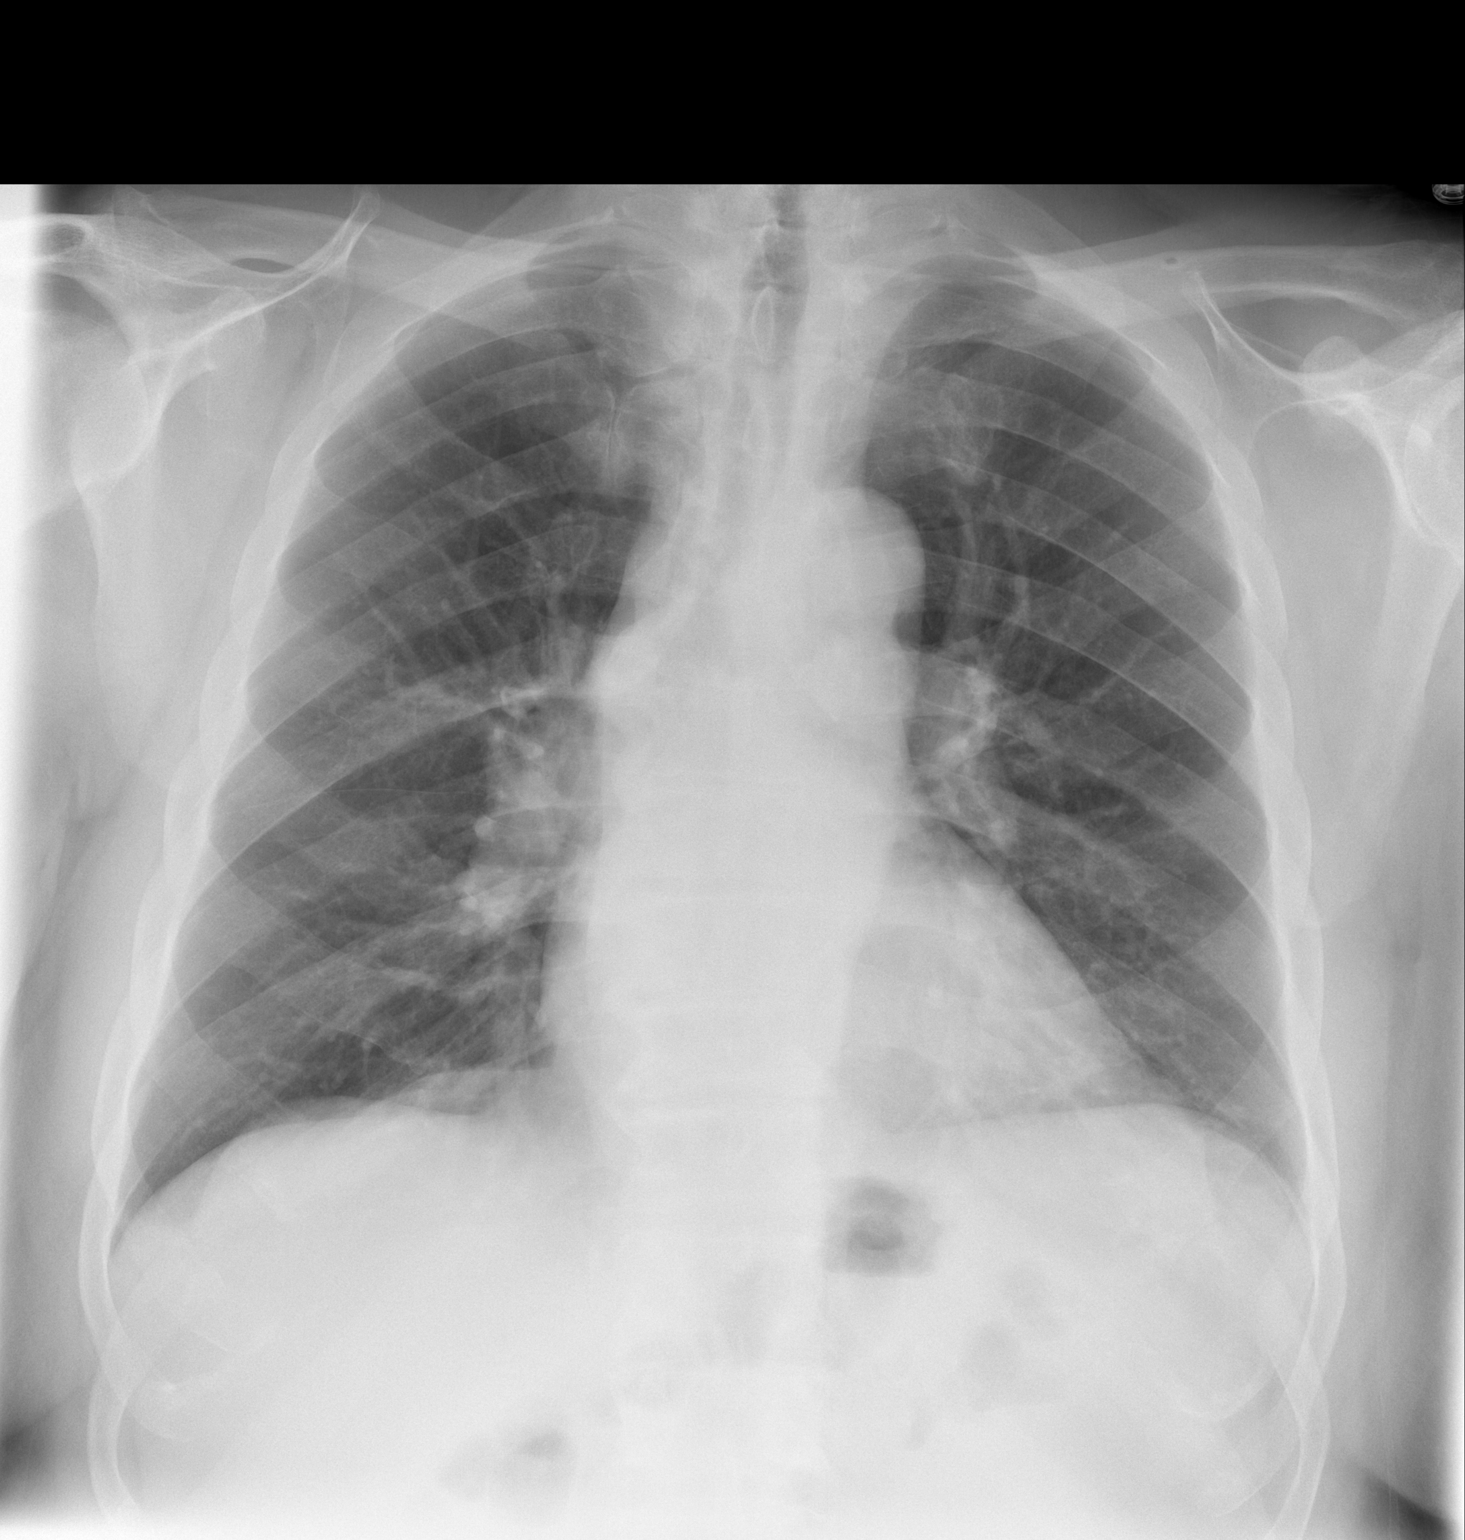

[w chest lat]
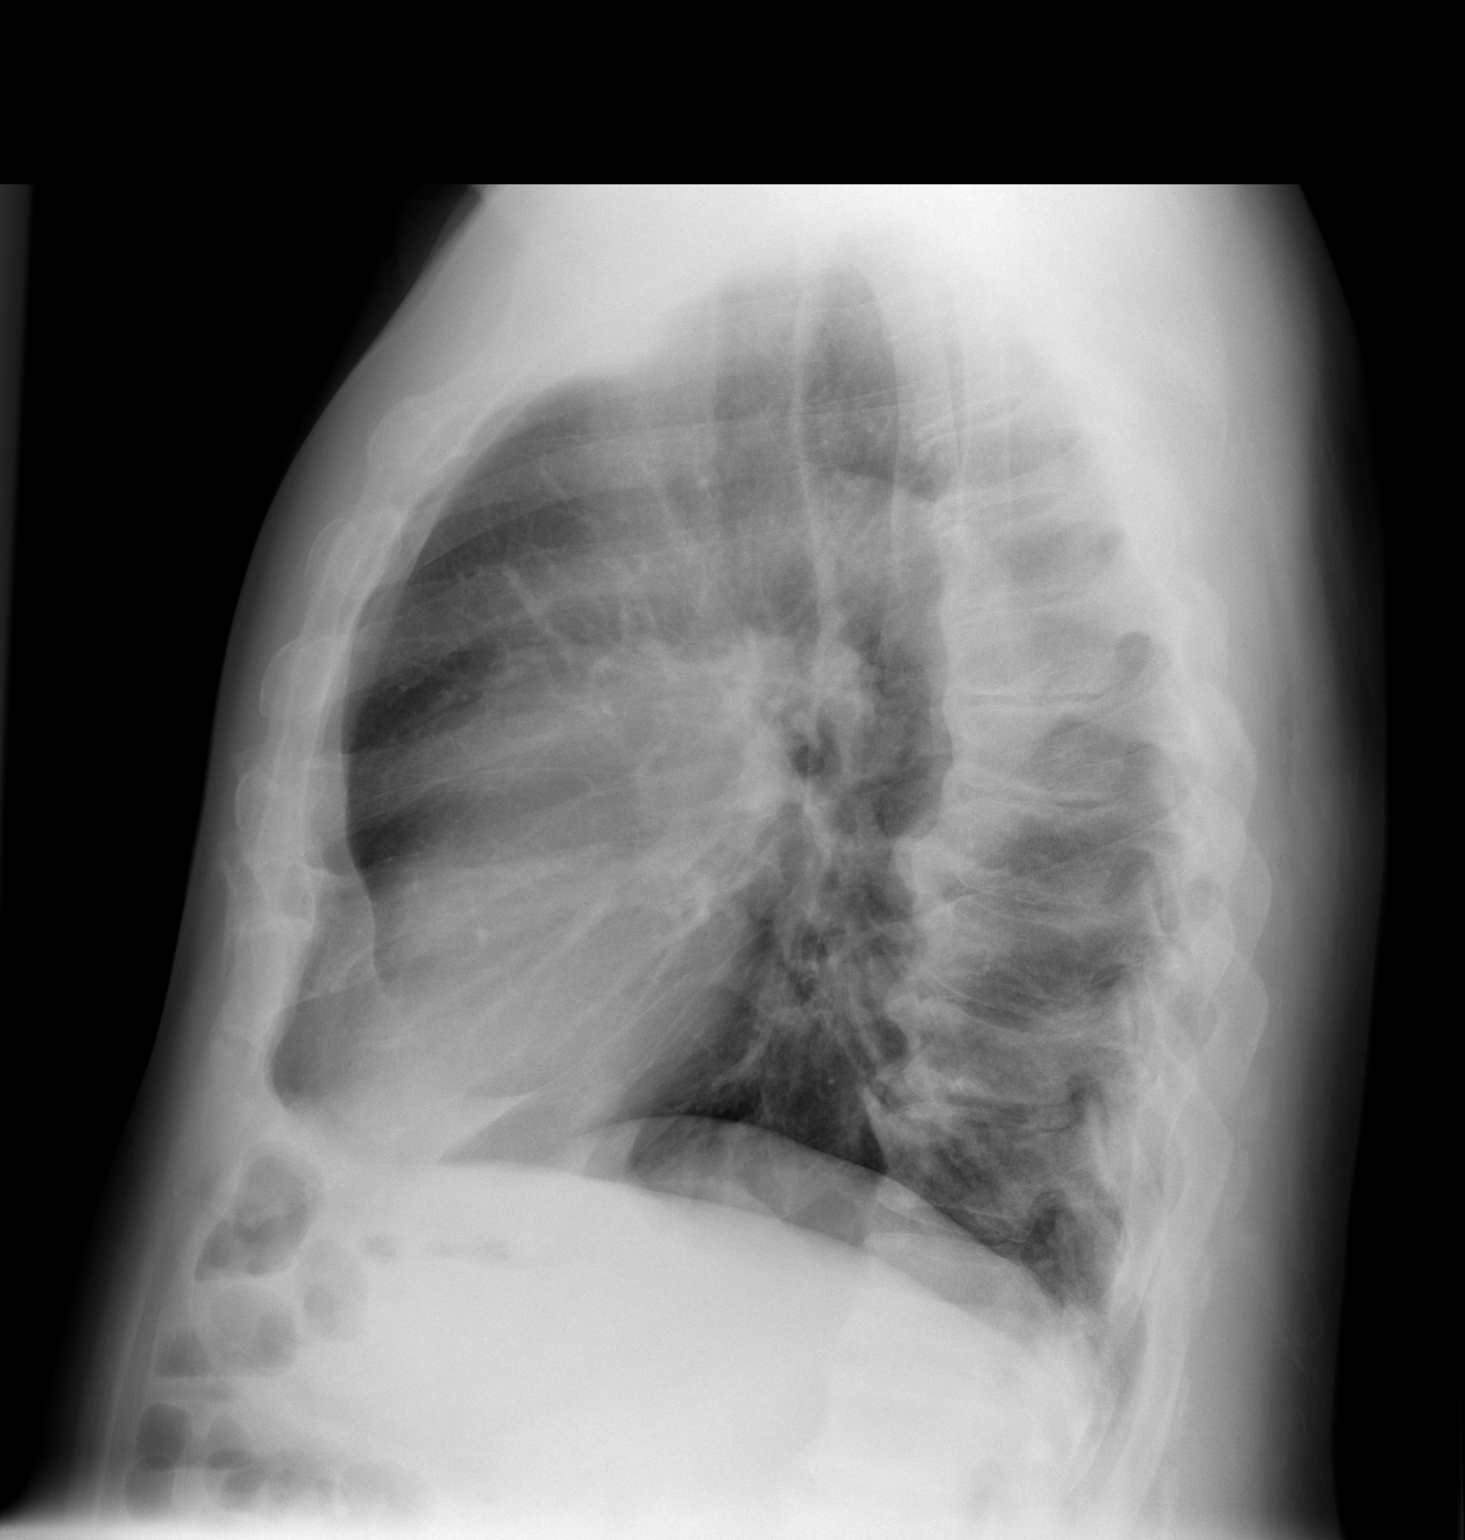

[2 of 2 positions shown; findings below may reference images not displayed]

FINDINGS: The heart size and mediastinal contours are within normal limits.
Both lungs are mildly hyperinflated. The visualized skeletal
structures are unremarkable.
IMPRESSION: COPD without acute abnormality.

## 2014-10-12 DIAGNOSIS — L309 Dermatitis, unspecified: Secondary | ICD-10-CM | POA: Diagnosis not present

## 2014-10-12 DIAGNOSIS — J4530 Mild persistent asthma, uncomplicated: Secondary | ICD-10-CM | POA: Diagnosis not present

## 2014-10-12 DIAGNOSIS — J309 Allergic rhinitis, unspecified: Secondary | ICD-10-CM | POA: Diagnosis not present

## 2014-10-12 DIAGNOSIS — E669 Obesity, unspecified: Secondary | ICD-10-CM | POA: Diagnosis not present

## 2014-10-12 DIAGNOSIS — I10 Essential (primary) hypertension: Secondary | ICD-10-CM | POA: Diagnosis not present

## 2014-10-12 DIAGNOSIS — Z1211 Encounter for screening for malignant neoplasm of colon: Secondary | ICD-10-CM | POA: Diagnosis not present

## 2014-10-12 DIAGNOSIS — Z6834 Body mass index (BMI) 34.0-34.9, adult: Secondary | ICD-10-CM | POA: Diagnosis not present

## 2014-10-12 DIAGNOSIS — Z23 Encounter for immunization: Secondary | ICD-10-CM | POA: Diagnosis not present

## 2015-04-23 DIAGNOSIS — I10 Essential (primary) hypertension: Secondary | ICD-10-CM | POA: Diagnosis not present

## 2015-04-23 DIAGNOSIS — L309 Dermatitis, unspecified: Secondary | ICD-10-CM | POA: Diagnosis not present

## 2015-04-23 DIAGNOSIS — J4530 Mild persistent asthma, uncomplicated: Secondary | ICD-10-CM | POA: Diagnosis not present

## 2015-04-23 DIAGNOSIS — E669 Obesity, unspecified: Secondary | ICD-10-CM | POA: Diagnosis not present

## 2015-04-23 DIAGNOSIS — Z1211 Encounter for screening for malignant neoplasm of colon: Secondary | ICD-10-CM | POA: Diagnosis not present

## 2015-04-23 DIAGNOSIS — Z6834 Body mass index (BMI) 34.0-34.9, adult: Secondary | ICD-10-CM | POA: Diagnosis not present

## 2015-04-23 DIAGNOSIS — J309 Allergic rhinitis, unspecified: Secondary | ICD-10-CM | POA: Diagnosis not present

## 2015-05-02 DIAGNOSIS — Z1211 Encounter for screening for malignant neoplasm of colon: Secondary | ICD-10-CM | POA: Diagnosis not present

## 2015-05-04 ENCOUNTER — Encounter (HOSPITAL_COMMUNITY): Payer: Self-pay | Admitting: *Deleted

## 2015-05-04 ENCOUNTER — Emergency Department (HOSPITAL_COMMUNITY)
Admission: EM | Admit: 2015-05-04 | Discharge: 2015-05-04 | Disposition: A | Discharge status: Home / Self Care | Payer: Medicare Other | Attending: Emergency Medicine | Admitting: Emergency Medicine

## 2015-05-04 DIAGNOSIS — T7840XA Allergy, unspecified, initial encounter: Secondary | ICD-10-CM

## 2015-05-04 DIAGNOSIS — Z79899 Other long term (current) drug therapy: Secondary | ICD-10-CM | POA: Insufficient documentation

## 2015-05-04 DIAGNOSIS — Y9289 Other specified places as the place of occurrence of the external cause: Secondary | ICD-10-CM | POA: Diagnosis not present

## 2015-05-04 DIAGNOSIS — Y998 Other external cause status: Secondary | ICD-10-CM | POA: Insufficient documentation

## 2015-05-04 DIAGNOSIS — X58XXXA Exposure to other specified factors, initial encounter: Secondary | ICD-10-CM | POA: Diagnosis not present

## 2015-05-04 DIAGNOSIS — J45909 Unspecified asthma, uncomplicated: Secondary | ICD-10-CM | POA: Insufficient documentation

## 2015-05-04 DIAGNOSIS — I10 Essential (primary) hypertension: Secondary | ICD-10-CM | POA: Diagnosis not present

## 2015-05-04 DIAGNOSIS — Y9389 Activity, other specified: Secondary | ICD-10-CM | POA: Insufficient documentation

## 2015-05-04 DIAGNOSIS — Z7951 Long term (current) use of inhaled steroids: Secondary | ICD-10-CM | POA: Diagnosis not present

## 2015-05-04 LAB — COMPREHENSIVE METABOLIC PANEL
ALBUMIN: 3.8 g/dL (ref 3.5–5.0)
ALK PHOS: 151 U/L — AB (ref 38–126)
ALT: 17 U/L (ref 17–63)
ANION GAP: 10 (ref 5–15)
AST: 19 U/L (ref 15–41)
BUN: 12 mg/dL (ref 6–20)
CALCIUM: 9.1 mg/dL (ref 8.9–10.3)
CHLORIDE: 107 mmol/L (ref 101–111)
CO2: 24 mmol/L (ref 22–32)
Creatinine, Ser: 1.01 mg/dL (ref 0.61–1.24)
GFR calc non Af Amer: >60 mL/min (ref >60)
GLUCOSE: 97 mg/dL (ref 65–99)
POTASSIUM: 3.9 mmol/L (ref 3.5–5.1)
SODIUM: 141 mmol/L (ref 135–145)
Total Bilirubin: 0.9 mg/dL (ref 0.3–1.2)
Total Protein: 7.3 g/dL (ref 6.5–8.1)

## 2015-05-04 LAB — CBC WITH DIFFERENTIAL/PLATELET
BASOS ABS: 0.1 10*3/uL (ref 0.0–0.1)
Basophils Relative: 1 %
EOS ABS: 0.5 10*3/uL (ref 0.0–0.7)
Eosinophils Relative: 10 %
HCT: 38.1 % — ABNORMAL LOW (ref 39.0–52.0)
HEMOGLOBIN: 12.3 g/dL — AB (ref 13.0–17.0)
LYMPHS PCT: 32 %
Lymphs Abs: 1.7 10*3/uL (ref 0.7–4.0)
MCH: 26.7 pg (ref 26.0–34.0)
MCHC: 32.3 g/dL (ref 30.0–36.0)
MCV: 82.8 fL (ref 78.0–100.0)
MONO ABS: 0.5 10*3/uL (ref 0.1–1.0)
Monocytes Relative: 9 %
Neutro Abs: 2.6 10*3/uL (ref 1.7–7.7)
Neutrophils Relative %: 48 %
PLATELETS: 208 10*3/uL (ref 150–400)
RBC: 4.6 MIL/uL (ref 4.22–5.81)
RDW: 15.1 % (ref 11.5–15.5)
WBC: 5.4 10*3/uL (ref 4.0–10.5)

## 2015-05-04 MED ORDER — FAMOTIDINE 20 MG PO TABS
20.0000 mg | ORAL_TABLET | Freq: Once | ORAL | Status: AC
  Administered 2015-05-04: 20 mg via ORAL
  Filled 2015-05-04: qty 1

## 2015-05-04 MED ORDER — PREDNISONE 10 MG PO TABS: 20.0000 mg | ORAL_TABLET | Freq: Every day | ORAL | Status: DC

## 2015-05-04 MED ORDER — METHYLPREDNISOLONE SODIUM SUCC 125 MG IJ SOLR
125.0000 mg | Freq: Once | INTRAMUSCULAR | Status: AC
  Administered 2015-05-04: 125 mg via INTRAMUSCULAR

## 2015-05-04 MED ORDER — METHYLPREDNISOLONE SODIUM SUCC 125 MG IJ SOLR
125.0000 mg | Freq: Once | INTRAMUSCULAR | Status: AC
  Administered 2015-05-04: 125 mg via INTRAVENOUS
  Filled 2015-05-04: qty 2

## 2015-05-04 MED ORDER — DIPHENHYDRAMINE HCL 50 MG/ML IJ SOLN
25.0000 mg | Freq: Once | INTRAMUSCULAR | Status: AC
  Administered 2015-05-04: 25 mg via INTRAVENOUS
  Filled 2015-05-04: qty 1

## 2015-05-04 MED ORDER — DIPHENHYDRAMINE HCL 25 MG PO CAPS
50.0000 mg | ORAL_CAPSULE | Freq: Once | ORAL | Status: AC
  Administered 2015-05-04: 50 mg via ORAL
  Filled 2015-05-04: qty 2

## 2015-05-04 MED ORDER — FAMOTIDINE IN NACL 20-0.9 MG/50ML-% IV SOLN
20.0000 mg | Freq: Once | INTRAVENOUS | Status: DC
  Filled 2015-05-04: qty 50

## 2015-05-04 NOTE — Discharge Instructions (Signed)
Take Benadryl 25 mg every 4 hours for swelling in lips.   Return if symptoms get worse

## 2015-05-04 NOTE — ED Notes (Signed)
Pt takes benazepril, reports he has taken it for two years. The swelling began after he took his medication this morning. Reports his lip has gotten progressively worse since. Reports it began on the left side of his upper lip, at this time entire upper lip is swollen. Airway remains intact.

## 2015-05-04 NOTE — ED Notes (Deleted)
Pt states oral swelling since 0945.  Denies new food or detergents.  Took bp med at 0700.  Airway patent at this time.

## 2015-05-04 NOTE — ED Notes (Signed)
PT states oral swelling since 0945.  Denies any new foods etc.  States took bp meds at 0700.  Airway patent at this time.

## 2015-05-07 NOTE — ED Provider Notes (Signed)
CSN: [id number]     Arrival date & time 05/04/15  1034 History   First MD Initiated Contact with Patient 05/04/15 1119     Chief Complaint  Patient presents with  . Allergic Reaction     (Consider location/radiation/quality/duration/timing/severity/associated sxs/prior Treatment) Patient is a 75 y.o. male presenting with allergic reaction. The history is provided by the patient (Patient awoke with swelling to his upper lips today.).  Allergic Reaction Presenting symptoms: no difficulty breathing and no rash   Severity:  Moderate Prior allergic episodes:  No prior episodes Context: no animal exposure   Relieved by:  Nothing Worsened by:  Nothing tried Ineffective treatments:  None tried   Past Medical History  Diagnosis Date  . Hypertension   . Asthma   . Seasonal allergies    History reviewed. No pertinent past surgical history. No family history on file. Social History  Substance Use Topics  . Smoking status: Never Smoker   . Smokeless tobacco: Never Used  . Alcohol Use: No    Review of Systems  Constitutional: Negative for appetite change and fatigue.  HENT: Negative for congestion, ear discharge and sinus pressure.        Swelling to upper lip  Eyes: Negative for discharge.  Respiratory: Negative for cough.   Cardiovascular: Negative for chest pain.  Gastrointestinal: Negative for abdominal pain and diarrhea.  Genitourinary: Negative for frequency and hematuria.  Musculoskeletal: Negative for back pain.  Skin: Negative for rash.  Neurological: Negative for seizures and headaches.  Psychiatric/Behavioral: Negative for hallucinations.      Allergies  Review of patient's allergies indicates no known allergies.  Home Medications   Prior to Admission medications   Medication Sig Start Date End Date Taking? Authorizing Provider  albuterol (PROAIR HFA) 108 (90 BASE) MCG/ACT inhaler Inhale 1-2 puffs into the lungs every 4 (four) hours as needed for wheezing or  shortness of breath.    Yes Historical Provider, MD  amLODipine-benazepril (LOTREL) 10-20 MG per capsule Take 1 capsule by mouth daily.   Yes Historical Provider, MD  fluticasone (FLOVENT HFA) 44 MCG/ACT inhaler Inhale 1 puff into the lungs 2 (two) times daily. 01/10/14  Yes Corey Harold, NP  ipratropium-albuterol (DUONEB) 0.5-2.5 (3) MG/3ML SOLN Take 3 mLs by nebulization 2 (two) times daily as needed (tightness).   Yes Historical Provider, MD  Chlorphen-Pseudoephed-APAP (CORICIDIN D PO) Take 2 tablets by mouth daily as needed (cold).    Historical Provider, MD  guaiFENesin-dextromethorphan (ROBITUSSIN DM) 100-10 MG/5ML syrup Take 10 mLs by mouth every 4 (four) hours as needed for cough.    Historical Provider, MD  predniSONE (DELTASONE) 10 MG tablet Take 2 tablets (20 mg total) by mouth daily. 05/04/15   Milton Ferguson, MD   BP 139/85 mmHg  Pulse 82  Temp(Src) 97.4 F (36.3 C) (Oral)  Resp 16  Ht 5\' 11"  (1.803 m)  Wt 246 lb (111.585 kg)  BMI 34.33 kg/m2  SpO2 95% Physical Exam  Constitutional: He is oriented to person, place, and time. He appears well-developed.  HENT:  Head: Normocephalic.  Patient had moderate swelling to upper lip oropharynx was completely normal  Eyes: Conjunctivae and EOM are normal. No scleral icterus.  Neck: Neck supple. No thyromegaly present.  Cardiovascular: Normal rate and regular rhythm.  Exam reveals no gallop and no friction rub.   No murmur heard. Pulmonary/Chest: No stridor. He has no wheezes. He has no rales. He exhibits no tenderness.  Abdominal: He exhibits no distension. There is no  tenderness. There is no rebound.  Musculoskeletal: Normal range of motion. He exhibits no edema.  Lymphadenopathy:    He has no cervical adenopathy.  Neurological: He is oriented to person, place, and time. He exhibits normal muscle tone. Coordination normal.  Skin: No rash noted. No erythema.  Psychiatric: He has a normal mood and affect. His behavior is normal.     ED Course  Procedures (including critical care time) Labs Review Labs Reviewed  CBC WITH DIFFERENTIAL/PLATELET - Abnormal; Notable for the following:    Hemoglobin 12.3 (*)    HCT 38.1 (*)    All other components within normal limits  COMPREHENSIVE METABOLIC PANEL - Abnormal; Notable for the following:    Alkaline Phosphatase 151 (*)    All other components within normal limits    Imaging Review No results found. I have personally reviewed and evaluated these images and lab results as part of my medical decision-making.   EKG Interpretation None      MDM   Final diagnoses:  Allergic reaction, initial encounter    Patient improved some with steroids and Benadryl. Suspect this is allergic reaction to an ACE inhibitor. We'll stop the ACE inhibitor put patient on prednisone for a few days and have him follow-up with PCP    Milton Ferguson, MD 05/07/15 [phone]

## 2015-05-09 DIAGNOSIS — T7840XA Allergy, unspecified, initial encounter: Secondary | ICD-10-CM | POA: Diagnosis not present

## 2015-05-09 DIAGNOSIS — I10 Essential (primary) hypertension: Secondary | ICD-10-CM | POA: Diagnosis not present

## 2015-05-16 DIAGNOSIS — I10 Essential (primary) hypertension: Secondary | ICD-10-CM | POA: Diagnosis not present

## 2015-05-17 DIAGNOSIS — L309 Dermatitis, unspecified: Secondary | ICD-10-CM | POA: Diagnosis not present

## 2015-06-12 DIAGNOSIS — J4540 Moderate persistent asthma, uncomplicated: Secondary | ICD-10-CM | POA: Diagnosis not present

## 2015-06-12 DIAGNOSIS — I10 Essential (primary) hypertension: Secondary | ICD-10-CM | POA: Diagnosis not present

## 2015-07-10 DIAGNOSIS — J309 Allergic rhinitis, unspecified: Secondary | ICD-10-CM | POA: Diagnosis not present

## 2015-07-10 DIAGNOSIS — I10 Essential (primary) hypertension: Secondary | ICD-10-CM | POA: Diagnosis not present

## 2015-07-24 DIAGNOSIS — I10 Essential (primary) hypertension: Secondary | ICD-10-CM | POA: Diagnosis not present

## 2015-09-05 ENCOUNTER — Other Ambulatory Visit: Payer: Self-pay | Admitting: Family Medicine

## 2015-09-05 ENCOUNTER — Ambulatory Visit
Admission: RE | Admit: 2015-09-05 | Discharge: 2015-09-05 | Disposition: A | Discharge status: Home / Self Care | Payer: Medicare Other | Source: Ambulatory Visit | Attending: Family Medicine | Admitting: Family Medicine

## 2015-09-05 DIAGNOSIS — M47816 Spondylosis without myelopathy or radiculopathy, lumbar region: Secondary | ICD-10-CM | POA: Diagnosis not present

## 2015-09-05 DIAGNOSIS — M79604 Pain in right leg: Secondary | ICD-10-CM

## 2015-09-05 DIAGNOSIS — M1611 Unilateral primary osteoarthritis, right hip: Secondary | ICD-10-CM | POA: Diagnosis not present

## 2015-09-05 DIAGNOSIS — R200 Anesthesia of skin: Secondary | ICD-10-CM

## 2015-09-11 ENCOUNTER — Other Ambulatory Visit: Payer: Self-pay | Admitting: Family Medicine

## 2015-09-11 DIAGNOSIS — R292 Abnormal reflex: Secondary | ICD-10-CM

## 2015-09-22 ENCOUNTER — Ambulatory Visit
Admission: RE | Admit: 2015-09-22 | Discharge: 2015-09-22 | Disposition: A | Discharge status: Home / Self Care | Payer: Medicare Other | Source: Ambulatory Visit | Attending: Family Medicine | Admitting: Family Medicine

## 2015-09-22 DIAGNOSIS — M4806 Spinal stenosis, lumbar region: Secondary | ICD-10-CM | POA: Diagnosis not present

## 2015-09-22 DIAGNOSIS — R292 Abnormal reflex: Secondary | ICD-10-CM

## 2015-10-01 DIAGNOSIS — M545 Low back pain: Secondary | ICD-10-CM | POA: Diagnosis not present

## 2015-10-01 DIAGNOSIS — D497 Neoplasm of unspecified behavior of endocrine glands and other parts of nervous system: Secondary | ICD-10-CM | POA: Diagnosis not present

## 2015-10-01 DIAGNOSIS — M5416 Radiculopathy, lumbar region: Secondary | ICD-10-CM | POA: Diagnosis not present

## 2015-10-01 DIAGNOSIS — M4806 Spinal stenosis, lumbar region: Secondary | ICD-10-CM | POA: Diagnosis not present

## 2015-10-01 DIAGNOSIS — Z6834 Body mass index (BMI) 34.0-34.9, adult: Secondary | ICD-10-CM | POA: Diagnosis not present

## 2015-10-09 ENCOUNTER — Other Ambulatory Visit: Payer: Self-pay | Admitting: Neurosurgery

## 2015-10-09 DIAGNOSIS — D497 Neoplasm of unspecified behavior of endocrine glands and other parts of nervous system: Secondary | ICD-10-CM

## 2015-10-18 ENCOUNTER — Ambulatory Visit
Admission: RE | Admit: 2015-10-18 | Discharge: 2015-10-18 | Disposition: A | Discharge status: Home / Self Care | Payer: Medicare Other | Source: Ambulatory Visit | Attending: Neurosurgery | Admitting: Neurosurgery

## 2015-10-18 DIAGNOSIS — G959 Disease of spinal cord, unspecified: Secondary | ICD-10-CM | POA: Diagnosis not present

## 2015-10-18 DIAGNOSIS — D497 Neoplasm of unspecified behavior of endocrine glands and other parts of nervous system: Secondary | ICD-10-CM

## 2015-10-18 MED ORDER — GADOPENTETATE DIMEGLUMINE 469.01 MG/ML IV SOLN: 20.0000 mL | Freq: Once | INTRAVENOUS | Status: DC | PRN

## 2015-10-29 ENCOUNTER — Other Ambulatory Visit: Payer: Self-pay | Admitting: Neurosurgery

## 2015-10-29 DIAGNOSIS — M5416 Radiculopathy, lumbar region: Secondary | ICD-10-CM | POA: Diagnosis not present

## 2015-10-29 DIAGNOSIS — D497 Neoplasm of unspecified behavior of endocrine glands and other parts of nervous system: Secondary | ICD-10-CM | POA: Diagnosis not present

## 2015-10-29 DIAGNOSIS — M545 Low back pain: Secondary | ICD-10-CM | POA: Diagnosis not present

## 2015-10-31 ENCOUNTER — Encounter (HOSPITAL_COMMUNITY): Payer: Self-pay | Admitting: *Deleted

## 2015-10-31 ENCOUNTER — Emergency Department (HOSPITAL_COMMUNITY)
Admission: EM | Admit: 2015-10-31 | Discharge: 2015-10-31 | Disposition: A | Discharge status: Home / Self Care | Payer: Medicare Other | Attending: Emergency Medicine | Admitting: Emergency Medicine

## 2015-10-31 ENCOUNTER — Emergency Department (HOSPITAL_COMMUNITY): Payer: Medicare Other

## 2015-10-31 DIAGNOSIS — R0602 Shortness of breath: Secondary | ICD-10-CM | POA: Diagnosis not present

## 2015-10-31 DIAGNOSIS — Z79899 Other long term (current) drug therapy: Secondary | ICD-10-CM | POA: Insufficient documentation

## 2015-10-31 DIAGNOSIS — J40 Bronchitis, not specified as acute or chronic: Secondary | ICD-10-CM | POA: Insufficient documentation

## 2015-10-31 DIAGNOSIS — I10 Essential (primary) hypertension: Secondary | ICD-10-CM | POA: Insufficient documentation

## 2015-10-31 DIAGNOSIS — R05 Cough: Secondary | ICD-10-CM | POA: Diagnosis not present

## 2015-10-31 MED ORDER — AEROCHAMBER PLUS W/MASK MISC
1.0000 | Freq: Once | Status: AC
  Administered 2015-10-31: 1

## 2015-10-31 NOTE — ED Notes (Signed)
Declined W/C at D/C and was escorted to lobby by RN. 

## 2015-10-31 NOTE — Discharge Instructions (Signed)
Use your pro-air inhaler with spacer 2 puffs every 4 hours needed for cough or shortness of breath. Call Dr. Sheryn Bison to arrange to be seen in the office if not feeling better in a week. Return if your condition worsens for any reason. Drink at least six 8 ounce glasses of water or Gatorade each day in order to stay well-hydrated

## 2015-10-31 NOTE — ED Triage Notes (Signed)
Pt reports that she has had productive cough with yellow sputum, chills and generalized weakness for 2 days.

## 2015-10-31 NOTE — ED Provider Notes (Addendum)
[facility] DEPT Provider Note   CSN: [id number] Arrival date & time: 10/31/15  1145     History   Chief Complaint Chief Complaint  Patient presents with  . Cough    HPI [name] [name] is a 75 y.o. male.  HPI Complains of cough productive of yellowish sputum onset 3 days ago with mild shortness of breath. Other associated symptoms include feeling hot and chills intermittently. Other associated symptoms include mild mild lightheadedness. denies fever. No treatment prior to coming here. No sore throat or headache. No nausea or vomiting No other associated symptoms.  Past Medical History:  Diagnosis Date  . Asthma   . Hypertension   . Seasonal allergies     Patient Active Problem List   Diagnosis Date Noted  . 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 Moderate intermittent asthma without complication 123456  . SOB (shortness of breath)   . Asthma exacerbation 01/09/2014    History reviewed. No pertinent surgical history.     Home Medications    Prior to Admission medications   Medication Sig Start Date End Date Taking? Authorizing Provider  albuterol (PROAIR HFA) 108 (90 BASE) MCG/ACT inhaler Inhale 1-2 puffs into the lungs every 4 (four) hours as needed for wheezing or shortness of breath.     Historical Provider, MD  amLODipine-benazepril (LOTREL) 10-20 MG per capsule Take 1 capsule by mouth daily.    Historical Provider, MD  Chlorphen-Pseudoephed-APAP (CORICIDIN D PO) Take 2 tablets by mouth daily as needed (cold).    Historical Provider, MD  fluticasone (FLOVENT HFA) 44 MCG/ACT inhaler Inhale 1 puff into the lungs 2 (two) times daily. 01/10/14   Corey Harold, NP  guaiFENesin-dextromethorphan (ROBITUSSIN DM) 100-10 MG/5ML syrup Take 10 mLs by mouth every 4 (four) hours as needed for cough.    Historical Provider, MD  ipratropium-albuterol (DUONEB) 0.5-2.5 (3) MG/3ML SOLN Take 3 mLs by nebulization 2 (two) times daily as needed (tightness).    Historical Provider, MD    predniSONE (DELTASONE) 10 MG tablet Take 2 tablets (20 mg total) by mouth daily. 05/04/15   Milton Ferguson, MD  Patient not currently taking prednisone  Family History No family history on file.  Social History Social History  Substance Use Topics  . Smoking status: Never Smoker  . Smokeless tobacco: Never Used  . Alcohol use No     Allergies   Ace inhibitors   Review of Systems Review of Systems  Constitutional: Positive for chills.  HENT: Negative.   Respiratory: Positive for cough and shortness of breath.   Cardiovascular: Positive for chest pain.       Syncope  Gastrointestinal: Negative.   Musculoskeletal: Negative.   Skin: Negative.   Allergic/Immunologic: Negative.   Neurological: Positive for light-headedness.  Psychiatric/Behavioral: Negative.   All other systems reviewed and are negative.    Physical Exam Updated Vital Signs BP 132/77   Pulse 84   Temp 98.6 F (37 C) (Oral)   Resp 16   Ht 6' (1.829 m)   Wt 242 lb (109.8 kg)   SpO2 98%   BMI 32.82 kg/m   Physical Exam  Constitutional: He appears well-developed and well-nourished.  HENT:  Head: Normocephalic and atraumatic.  Eyes: Conjunctivae are normal. Pupils are equal, round, and reactive to light.  Neck: Neck supple. No tracheal deviation present. No thyromegaly present.  Cardiovascular: Normal rate and regular rhythm.   No murmur heard. Pulmonary/Chest: Effort normal and breath sounds normal.  Abdominal: Soft. Bowel sounds are normal. He  exhibits no distension. There is no tenderness.  Musculoskeletal: Normal range of motion. He exhibits no edema or tenderness.  Neurological: He is alert. Coordination normal.  Skin: Skin is warm and dry. No rash noted.  Psychiatric: He has a normal mood and affect.  Nursing note and vitals reviewed.    ED Treatments / Results  Labs (all labs ordered are listed, but only abnormal results are displayed) Labs Reviewed - No data to display  EKG  EKG  Interpretation None       Radiology Dg Chest 2 View  Result Date: 10/31/2015 CLINICAL DATA:  Cough, fever, and shortness of breath. EXAM: CHEST  2 VIEW COMPARISON:  01/10/2014 and 11/14/2013 FINDINGS: The heart size and mediastinal contours are within normal limits. Both lungs are clear. The visualized skeletal structures are unremarkable. IMPRESSION: No active cardiopulmonary disease. Electronically Signed   By: Lorriane Shire M.D.   On: 10/31/2015 12:41    Procedures Procedures (including critical care time)  Medications Ordered in ED Medications  aerochamber plus with mask device 1 each (not administered)     Initial Impression / Assessment and Plan / ED Course  I have reviewed the triage vital signs and the nursing notes.  Pertinent labs & imaging results that were available during my care of the patient were reviewed by me and considered in my medical decision making (see chart for details).  Clinical Course    Chest x-ray viewed by me.  Results for orders placed or performed during the hospital encounter of 05/04/15  CBC with Differential/Platelet  Result Value Ref Range   WBC 5.4 4.0 - 10.5 K/uL   RBC 4.60 4.22 - 5.81 MIL/uL   Hemoglobin 12.3 (L) 13.0 - 17.0 g/dL   HCT 38.1 (L) 39.0 - 52.0 %   MCV 82.8 78.0 - 100.0 fL   MCH 26.7 26.0 - 34.0 pg   MCHC 32.3 30.0 - 36.0 g/dL   RDW 15.1 11.5 - 15.5 %   Platelets 208 150 - 400 K/uL   Neutrophils Relative % 48 %   Lymphocytes Relative 32 %   Monocytes Relative 9 %   Eosinophils Relative 10 %   Basophils Relative 1 %   Neutro Abs 2.6 1.7 - 7.7 K/uL   Lymphs Abs 1.7 0.7 - 4.0 K/uL   Monocytes Absolute 0.5 0.1 - 1.0 K/uL   Eosinophils Absolute 0.5 0.0 - 0.7 K/uL   Basophils Absolute 0.1 0.0 - 0.1 K/uL   Smear Review MORPHOLOGY UNREMARKABLE   Comprehensive metabolic panel  Result Value Ref Range   Sodium 141 135 - 145 mmol/L   Potassium 3.9 3.5 - 5.1 mmol/L   Chloride 107 101 - 111 mmol/L   CO2 24 22 - 32  mmol/L   Glucose, Bld 97 65 - 99 mg/dL   BUN 12 6 - 20 mg/dL   Creatinine, Ser 1.01 0.61 - 1.24 mg/dL   Calcium 9.1 8.9 - 10.3 mg/dL   Total Protein 7.3 6.5 - 8.1 g/dL   Albumin 3.8 3.5 - 5.0 g/dL   AST 19 15 - 41 U/L   ALT 17 17 - 63 U/L   Alkaline Phosphatase 151 (H) 38 - 126 U/L   Total Bilirubin 0.9 0.3 - 1.2 mg/dL   GFR calc non Af Amer >60 >60 mL/min   GFR calc Af Amer >60 >60 mL/min   Anion gap 10 5 - 15   Dg Chest 2 View  Result Date: 10/31/2015 CLINICAL DATA:  Cough, fever, and  shortness of breath. EXAM: CHEST  2 VIEW COMPARISON:  01/10/2014 and 11/14/2013 FINDINGS: The heart size and mediastinal contours are within normal limits. Both lungs are clear. The visualized skeletal structures are unremarkable. IMPRESSION: No active cardiopulmonary disease. Electronically Signed   By: Lorriane Shire M.D.   On: 10/31/2015 12:41   Mr Lumbar Spine W Contrast  Result Date: 10/18/2015 CLINICAL DATA:  Spinal canal mass seen on prior MRI EXAM: MRI LUMBAR SPINE WITH CONTRAST TECHNIQUE: Pre and postcontrast axial T1 weighted imaging of the lumbar spine was obtained, as well as postcontrast sagittal fat suppressed T1 weighted imaging. CONTRAST:  20 mL MultiHance IV COMPARISON:  Lumbar spine MRI 09/22/2015 FINDINGS: There is a diffusely contrast-enhancing intradural mass arising from the right aspect of the distal conus medullaris/proximal right cauda equina nerve roots. The mass measures 1.0 cm AP by 0.6 cm transverse by 0.8 cm craniocaudal. No other contrast-enhancing lesions are identified. 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 Drop metastasis from an unknown primary is considered less likely. MRI of the brain and cervicothoracic spine may be  helpful to determine if there is a potential source of CSF-disseminated metastatic disease. Electronically Signed   By: Ulyses Jarred M.D.   On: 10/18/2015 22:27    Plan albuterol HFA with spacer to use 2 puffs every 4 hours as needed for cough or shortness of breath. Follow up with Dr. Sheryn Bison if not better in a week. Encourage oral hydration Final Clinical Impressions(s) / ED Diagnoses  Diagnosis acute bronchitis Final diagnoses:  None    New Prescriptions New Prescriptions   No medications on file     Orlie Dakin, MD 10/31/15 [address], MD 10/31/15 1432

## 2015-11-07 ENCOUNTER — Ambulatory Visit
Admission: RE | Admit: 2015-11-07 | Discharge: 2015-11-07 | Disposition: A | Discharge status: Home / Self Care | Payer: Medicare Other | Source: Ambulatory Visit | Attending: Neurosurgery | Admitting: Neurosurgery

## 2015-11-07 DIAGNOSIS — C720 Malignant neoplasm of spinal cord: Secondary | ICD-10-CM | POA: Diagnosis not present

## 2015-11-07 DIAGNOSIS — D497 Neoplasm of unspecified behavior of endocrine glands and other parts of nervous system: Secondary | ICD-10-CM

## 2015-11-07 MED ORDER — GADOBENATE DIMEGLUMINE 529 MG/ML IV SOLN
20.0000 mL | Freq: Once | INTRAVENOUS | Status: AC | PRN
  Administered 2015-11-07: 20 mL via INTRAVENOUS

## 2015-11-09 ENCOUNTER — Other Ambulatory Visit: Payer: Medicare Other

## 2015-11-09 DIAGNOSIS — I10 Essential (primary) hypertension: Secondary | ICD-10-CM | POA: Diagnosis not present

## 2015-11-09 DIAGNOSIS — G959 Disease of spinal cord, unspecified: Secondary | ICD-10-CM | POA: Diagnosis not present

## 2015-11-09 DIAGNOSIS — J0100 Acute maxillary sinusitis, unspecified: Secondary | ICD-10-CM | POA: Diagnosis not present

## 2015-11-10 ENCOUNTER — Ambulatory Visit
Admission: RE | Admit: 2015-11-10 | Discharge: 2015-11-10 | Disposition: A | Discharge status: Home / Self Care | Payer: Medicare Other | Source: Ambulatory Visit | Attending: Neurosurgery | Admitting: Neurosurgery

## 2015-11-10 DIAGNOSIS — D497 Neoplasm of unspecified behavior of endocrine glands and other parts of nervous system: Secondary | ICD-10-CM

## 2015-11-10 DIAGNOSIS — M5124 Other intervertebral disc displacement, thoracic region: Secondary | ICD-10-CM | POA: Diagnosis not present

## 2015-11-10 DIAGNOSIS — M4802 Spinal stenosis, cervical region: Secondary | ICD-10-CM | POA: Diagnosis not present

## 2015-11-10 MED ORDER — GADOBENATE DIMEGLUMINE 529 MG/ML IV SOLN
20.0000 mL | Freq: Once | INTRAVENOUS | Status: AC | PRN
  Administered 2015-11-10: 20 mL via INTRAVENOUS

## 2015-11-12 ENCOUNTER — Emergency Department (HOSPITAL_COMMUNITY): Payer: Medicare Other

## 2015-11-12 ENCOUNTER — Encounter (HOSPITAL_COMMUNITY): Payer: Self-pay

## 2015-11-12 ENCOUNTER — Emergency Department (HOSPITAL_COMMUNITY)
Admission: EM | Admit: 2015-11-12 | Discharge: 2015-11-13 | Disposition: A | Discharge status: Home / Self Care | Payer: Medicare Other | Attending: Emergency Medicine | Admitting: Emergency Medicine

## 2015-11-12 DIAGNOSIS — J40 Bronchitis, not specified as acute or chronic: Secondary | ICD-10-CM | POA: Diagnosis not present

## 2015-11-12 DIAGNOSIS — I10 Essential (primary) hypertension: Secondary | ICD-10-CM | POA: Insufficient documentation

## 2015-11-12 DIAGNOSIS — R0602 Shortness of breath: Secondary | ICD-10-CM | POA: Diagnosis not present

## 2015-11-12 DIAGNOSIS — R079 Chest pain, unspecified: Secondary | ICD-10-CM | POA: Diagnosis not present

## 2015-11-12 DIAGNOSIS — Z79899 Other long term (current) drug therapy: Secondary | ICD-10-CM | POA: Diagnosis not present

## 2015-11-12 DIAGNOSIS — R0789 Other chest pain: Secondary | ICD-10-CM | POA: Diagnosis present

## 2015-11-12 LAB — BASIC METABOLIC PANEL
Anion gap: 11 (ref 5–15)
BUN: 9 mg/dL (ref 6–20)
CALCIUM: 9.4 mg/dL (ref 8.9–10.3)
CHLORIDE: 102 mmol/L (ref 101–111)
CO2: 25 mmol/L (ref 22–32)
CREATININE: 1.07 mg/dL (ref 0.61–1.24)
Glucose, Bld: 112 mg/dL — ABNORMAL HIGH (ref 65–99)
Potassium: 3.4 mmol/L — ABNORMAL LOW (ref 3.5–5.1)
SODIUM: 138 mmol/L (ref 135–145)

## 2015-11-12 LAB — CBC
HCT: 40.3 % (ref 39.0–52.0)
Hemoglobin: 13.3 g/dL (ref 13.0–17.0)
MCH: 27.3 pg (ref 26.0–34.0)
MCHC: 33 g/dL (ref 30.0–36.0)
MCV: 82.6 fL (ref 78.0–100.0)
PLATELETS: 191 10*3/uL (ref 150–400)
RBC: 4.88 MIL/uL (ref 4.22–5.81)
RDW: 15 % (ref 11.5–15.5)
WBC: 6.2 10*3/uL (ref 4.0–10.5)

## 2015-11-12 LAB — I-STAT TROPONIN, ED: TROPONIN I, POC: 0 ng/mL (ref 0.00–0.08)

## 2015-11-12 LAB — CBG MONITORING, ED: GLUCOSE-CAPILLARY: 82 mg/dL (ref 65–99)

## 2015-11-12 MED ORDER — PREDNISONE 20 MG PO TABS
60.0000 mg | ORAL_TABLET | Freq: Once | ORAL | Status: AC
  Administered 2015-11-12: 60 mg via ORAL
  Filled 2015-11-12: qty 3

## 2015-11-12 NOTE — ED Provider Notes (Signed)
[facility] DEPT Provider Note   CSN: [id number] Arrival date & time: 11/12/15  1550   By signing my name below, I, Avnee Patel, attest that this documentation has been prepared under the direction and in the presence of Merryl Hacker, MD  Electronically Signed: Delton Prairie, ED Scribe. 11/12/15. 11:36 PM.   History   Chief Complaint Chief Complaint  Patient presents with  . Chills  . Chest Pain    The history is provided by the patient. No language interpreter was used.   HPI Comments:  [name] [name] is a 75 y.o. male, with a hx of asthma, who presents to the Emergency Department complaining of chills and chest tightness x 1 week. No exacerbating factors noted. He states he was just walking when today's symptoms began. Pt notes he used his inhaler with relief. He states he "feels great right now".  He was seen for similar symptoms last week. Pt notes associated sneezing and rhinorrhea. He denies taking allergy medications, current SOB, current chest tightness, nausea, vomiting, abdominal pain, leg swelling, fevers and coughs. Pt states he has not used his inhaler since a week for asthma. He also notes an abrasion to his R cheek from a bump he noticed x 4 days. He states he rubbed alcohol on the bump which burned his skin. Pt denies any other symptoms or complaints at this time.   Past Medical History:  Diagnosis Date  . Asthma   . Hypertension   . Seasonal allergies     Patient Active Problem List   Diagnosis Date Noted  . 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 Moderate intermittent asthma without complication 123456  . SOB (shortness of breath)   . Asthma exacerbation 01/09/2014    History reviewed. No pertinent surgical history.     Home Medications    Prior to Admission medications   Medication Sig Start Date End Date Taking? Authorizing Provider  albuterol (PROAIR HFA) 108 (90 BASE) MCG/ACT inhaler Inhale 1-2 puffs into the lungs every 4 (four) hours as needed  for wheezing or shortness of breath.    Yes Historical Provider, MD  amLODipine (NORVASC) 10 MG tablet Take 10 mg by mouth daily.   Yes Historical Provider, MD  Chlorphen-Pseudoephed-APAP (CORICIDIN D PO) Take 2 tablets by mouth daily as needed (cold).   Yes Historical Provider, MD  fluticasone (FLOVENT HFA) 44 MCG/ACT inhaler Inhale 1 puff into the lungs 2 (two) times daily. 01/10/14  Yes Corey Harold, NP  guaiFENesin-dextromethorphan (ROBITUSSIN DM) 100-10 MG/5ML syrup Take 10 mLs by mouth every 4 (four) hours as needed for cough.   Yes Historical Provider, MD  hydrochlorothiazide (HYDRODIURIL) 25 MG tablet Take 12.5 mg by mouth daily.   Yes Historical Provider, MD  ipratropium-albuterol (DUONEB) 0.5-2.5 (3) MG/3ML SOLN Take 3 mLs by nebulization 2 (two) times daily as needed (tightness).   Yes Historical Provider, MD  fluticasone (FLONASE) 50 MCG/ACT nasal spray Place 2 sprays into both nostrils daily. 11/13/15   Merryl Hacker, MD  predniSONE (DELTASONE) 20 MG tablet Take 2 tablets (40 mg total) by mouth daily. 11/13/15   Merryl Hacker, MD    Family History No family history on file.  Social History Social History  Substance Use Topics  . Smoking status: Never Smoker  . Smokeless tobacco: Never Used  . Alcohol use No     Allergies   Ace inhibitors   Review of Systems Review of Systems  Constitutional: Positive for chills. Negative for fever.  HENT:  Positive for rhinorrhea and sneezing.   Respiratory: Positive for chest tightness and shortness of breath. Negative for cough.   Cardiovascular: Negative for leg swelling.  Gastrointestinal: Negative for abdominal pain, nausea and vomiting.  Skin: Positive for wound.  All other systems reviewed and are negative.    Physical Exam Updated Vital Signs BP 125/77   Pulse 82   Temp 97.8 F (36.6 C)   Resp 20   Ht 5\' 11"  (1.803 m)   Wt 242 lb (109.8 kg)   SpO2 99%   BMI 33.75 kg/m   Physical Exam  Constitutional:  He is oriented to person, place, and time.  elderly  HENT:  Head: Normocephalic and atraumatic.  Cardiovascular: Normal rate, regular rhythm and normal heart sounds.   No murmur heard. Pulmonary/Chest: Effort normal and breath sounds normal. No respiratory distress.  Fair movement, no acute distress, occasional wheeze  Abdominal: Soft. Bowel sounds are normal. There is no tenderness. There is no rebound.  Musculoskeletal: He exhibits no edema.  Neurological: He is alert and oriented to person, place, and time.  Skin: Skin is warm and dry.  Less than 1 cm area of missing skin just over the preauricular space, no tenderness, no fluctuance, slightly hard and nodular  Psychiatric: He has a normal mood and affect.  Nursing note and vitals reviewed.    ED Treatments / Results  DIAGNOSTIC STUDIES:  Oxygen Saturation is 99% on RA, normal by my interpretation.    COORDINATION OF CARE:  11:32 PM Discussed treatment plan with pt at bedside and pt agreed to plan.  Labs (all labs ordered are listed, but only abnormal results are displayed) Labs Reviewed  BASIC METABOLIC PANEL - Abnormal; Notable for the following:       Result Value   Potassium 3.4 (*)    Glucose, Bld 112 (*)    All other components within normal limits  CBC  I-STAT TROPOININ, ED  CBG MONITORING, ED  I-STAT TROPOININ, ED    EKG  EKG Interpretation  Date/Time:  Monday November 12 2015 15:58:08 EDT Ventricular Rate:  101 PR Interval:  206 QRS Duration: 92 QT Interval:  344 QTC Calculation: 446 R Axis:   61 Text Interpretation:  Sinus tachycardia Nonspecific T wave abnormality Abnormal ECG Confirmed by Dina Rich  MD, COURTNEY ([id number]) on 11/12/2015 11:10:18 PM       Radiology Dg Chest 2 View  Result Date: 11/12/2015 CLINICAL DATA:  Shortness of breath and chest pain for 2 weeks EXAM: CHEST  2 VIEW COMPARISON:  October 31, 2015 FINDINGS: There is no edema or consolidation. Heart size and pulmonary vascularity  are normal. No adenopathy. No pneumothorax. There is degenerative change in the thoracic spine. IMPRESSION: No edema or consolidation. Electronically Signed   By: Lowella Grip III M.D.   On: 11/12/2015 17:28    Procedures Procedures (including critical care time)  Medications Ordered in ED Medications  predniSONE (DELTASONE) tablet 60 mg (60 mg Oral Given 11/12/15 2344)     Initial Impression / Assessment and Plan / ED Course  I have reviewed the triage vital signs and the nursing notes.  Pertinent labs & imaging results that were available during my care of the patient were reviewed by me and considered in my medical decision making (see chart for details).  Clinical Course    Patient presents with episode of chest discomfort that improved with inhaler. Reports upper respiratory symptoms. Currently nontoxic in a symptomatic. Vital signs reassuring. Scant wheezing. Also reports  some allergic symptoms. Patient given prednisone. He was not previously placed on prednisone.  Delta troponin negative. Suspect ongoing upper respiratory infection versus seasonal allergies. Will discharge with a course of prednisone and inhaler. Flonase as needed.  After history, exam, and medical workup I feel the patient has been appropriately medically screened and is safe for discharge home. Pertinent diagnoses were discussed with the patient. Patient was given return precautions.   Final Clinical Impressions(s) / ED Diagnoses   Final diagnoses:  Bronchitis    New Prescriptions New Prescriptions   FLUTICASONE (FLONASE) 50 MCG/ACT NASAL SPRAY    Place 2 sprays into both nostrils daily.   PREDNISONE (DELTASONE) 20 MG TABLET    Take 2 tablets (40 mg total) by mouth daily.   I personally performed the services described in this documentation, which was scribed in my presence. The recorded information has been reviewed and is accurate.    Merryl Hacker, MD 11/13/15 [phone]

## 2015-11-12 NOTE — ED Triage Notes (Signed)
Per Pt, Pt reports having some chest tightness and some chills for the past week. Pt reports being diagnosed with Bronchitis one week and "has not felt better, but have not felt worse." Pt had MRI on Wednesday and Saturday due to an abnormality found on the exam in his lungs. Some yellow sputum noticed with cough.

## 2015-11-13 DIAGNOSIS — J40 Bronchitis, not specified as acute or chronic: Secondary | ICD-10-CM | POA: Diagnosis not present

## 2015-11-13 LAB — I-STAT TROPONIN, ED: TROPONIN I, POC: 0.01 ng/mL (ref 0.00–0.08)

## 2015-11-13 MED ORDER — FLUTICASONE PROPIONATE 50 MCG/ACT NA SUSP: 2.0000 | Freq: Every day | NASAL | 2 refills | Status: DC

## 2015-11-13 MED ORDER — PREDNISONE 20 MG PO TABS: 40.0000 mg | ORAL_TABLET | Freq: Every day | ORAL | 0 refills | Status: DC

## 2015-11-13 NOTE — Discharge Instructions (Signed)
You were seen today for chest pain and upper respiratory symptoms. You likely have a virus and some component of allergies. Continue inhaler. He'll be started on prednisone. Follow-up with her primary physician if symptoms do not improve.

## 2015-11-15 DIAGNOSIS — M5416 Radiculopathy, lumbar region: Secondary | ICD-10-CM | POA: Diagnosis not present

## 2015-11-15 DIAGNOSIS — M545 Low back pain: Secondary | ICD-10-CM | POA: Diagnosis not present

## 2015-11-15 DIAGNOSIS — Z6833 Body mass index (BMI) 33.0-33.9, adult: Secondary | ICD-10-CM | POA: Diagnosis not present

## 2015-11-15 DIAGNOSIS — D497 Neoplasm of unspecified behavior of endocrine glands and other parts of nervous system: Secondary | ICD-10-CM | POA: Diagnosis not present

## 2015-11-20 ENCOUNTER — Emergency Department (HOSPITAL_COMMUNITY): Payer: Medicare Other

## 2015-11-20 ENCOUNTER — Encounter (HOSPITAL_COMMUNITY): Payer: Self-pay | Admitting: Radiology

## 2015-11-20 ENCOUNTER — Emergency Department (HOSPITAL_COMMUNITY)
Admission: EM | Admit: 2015-11-20 | Discharge: 2015-11-20 | Disposition: A | Discharge status: Home / Self Care | Payer: Medicare Other | Attending: Emergency Medicine | Admitting: Emergency Medicine

## 2015-11-20 DIAGNOSIS — R0602 Shortness of breath: Secondary | ICD-10-CM | POA: Diagnosis not present

## 2015-11-20 DIAGNOSIS — Z79899 Other long term (current) drug therapy: Secondary | ICD-10-CM | POA: Insufficient documentation

## 2015-11-20 DIAGNOSIS — I10 Essential (primary) hypertension: Secondary | ICD-10-CM | POA: Insufficient documentation

## 2015-11-20 DIAGNOSIS — J45909 Unspecified asthma, uncomplicated: Secondary | ICD-10-CM | POA: Insufficient documentation

## 2015-11-20 DIAGNOSIS — K5900 Constipation, unspecified: Secondary | ICD-10-CM | POA: Diagnosis not present

## 2015-11-20 LAB — BASIC METABOLIC PANEL
ANION GAP: 9 (ref 5–15)
BUN: 14 mg/dL (ref 6–20)
CALCIUM: 9 mg/dL (ref 8.9–10.3)
CO2: 25 mmol/L (ref 22–32)
Chloride: 104 mmol/L (ref 101–111)
Creatinine, Ser: 0.98 mg/dL (ref 0.61–1.24)
GFR calc Af Amer: >60 mL/min (ref >60)
GLUCOSE: 112 mg/dL — AB (ref 65–99)
Potassium: 3.1 mmol/L — ABNORMAL LOW (ref 3.5–5.1)
Sodium: 138 mmol/L (ref 135–145)

## 2015-11-20 LAB — CBC WITH DIFFERENTIAL/PLATELET
Basophils Absolute: 0 10*3/uL (ref 0.0–0.1)
Basophils Relative: 0 %
EOS PCT: 4 %
Eosinophils Absolute: 0.2 10*3/uL (ref 0.0–0.7)
HEMATOCRIT: 37.2 % — AB (ref 39.0–52.0)
Hemoglobin: 12.3 g/dL — ABNORMAL LOW (ref 13.0–17.0)
LYMPHS PCT: 22 %
Lymphs Abs: 1.4 10*3/uL (ref 0.7–4.0)
MCH: 27.5 pg (ref 26.0–34.0)
MCHC: 33.1 g/dL (ref 30.0–36.0)
MCV: 83.2 fL (ref 78.0–100.0)
MONO ABS: 0.6 10*3/uL (ref 0.1–1.0)
MONOS PCT: 10 %
NEUTROS ABS: 4 10*3/uL (ref 1.7–7.7)
Neutrophils Relative %: 64 %
PLATELETS: 183 10*3/uL (ref 150–400)
RBC: 4.47 MIL/uL (ref 4.22–5.81)
RDW: 14.8 % (ref 11.5–15.5)
WBC: 6.3 10*3/uL (ref 4.0–10.5)

## 2015-11-20 LAB — I-STAT TROPONIN, ED: Troponin i, poc: 0 ng/mL (ref 0.00–0.08)

## 2015-11-20 LAB — BRAIN NATRIURETIC PEPTIDE: B Natriuretic Peptide: 13.4 pg/mL (ref 0.0–100.0)

## 2015-11-20 MED ORDER — IOPAMIDOL (ISOVUE-370) INJECTION 76%
INTRAVENOUS | Status: AC
  Administered 2015-11-20: 80 mL
  Filled 2015-11-20: qty 100

## 2015-11-20 MED ORDER — POTASSIUM CHLORIDE CRYS ER 20 MEQ PO TBCR
40.0000 meq | EXTENDED_RELEASE_TABLET | Freq: Once | ORAL | Status: AC
  Administered 2015-11-20: 40 meq via ORAL
  Filled 2015-11-20: qty 2

## 2015-11-20 NOTE — ED Provider Notes (Signed)
[facility] DEPT Provider Note   CSN: [id number] Arrival date & time: 11/20/15  [id number]     History   Chief Complaint Chief Complaint  Patient presents with  . Chest Pain  . Shortness of Breath    HPI [name] [name] is a 75 y.o. male.  75yo M w/ PMH including HTN and asthma who p/w shortness of breath. The patient has been to the ED twice this month, most recently on 10/30, complaining of shortness of breath and chest tightness. Recently he was started on prednisone course which he states improved nasal congestion but has not improved his shortness of breath. His shortness of breath is worse with exertion and he reports that he has not slept in multiple days because of it. Wife reports that he does sleep on 2 pillows. No lower extremity edema. He denies any associated cough. He has some chest tightness but the chest tightness is not associated with exertion. He reports associated intermittent dizziness, feeling unsteady on his feet, as well as nausea. He has had constipation for the last 5 days but has not taken any medications for it. He reports decreased appetite but does drink a good amount of fluids and has normal urination. No fevers.   The history is provided by the patient.  Chest Pain   Associated symptoms include shortness of breath.  Shortness of Breath  Associated symptoms include chest pain.    Past Medical History:  Diagnosis Date  . Asthma   . Hypertension   . Seasonal allergies     Patient Active Problem List   Diagnosis Date Noted  . 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 Moderate intermittent asthma without complication [id number]  . SOB (shortness of breath)   . Asthma exacerbation 01/09/2014    No past surgical history on file.     Home Medications    Prior to Admission medications   Medication Sig Start Date End Date Taking? Authorizing Provider  albuterol (PROAIR HFA) 108 (90 BASE) MCG/ACT inhaler Inhale 1-2 puffs into the lungs every 4 (four) hours as  needed for wheezing or shortness of breath.    Yes Historical Provider, MD  amLODipine (NORVASC) 10 MG tablet Take 10 mg by mouth daily.   Yes Historical Provider, MD  Chlorphen-Pseudoephed-APAP (CORICIDIN D PO) Take 2 tablets by mouth daily as needed (cold).   Yes Historical Provider, MD  FLOVENT HFA 220 MCG/ACT inhaler Inhale 1 puff into the lungs 2 (two) times daily. 10/15/15  Yes Historical Provider, MD  fluticasone (FLONASE) 50 MCG/ACT nasal spray Place 2 sprays into both nostrils daily. 11/13/15  Yes Merryl Hacker, MD  fluticasone (FLOVENT HFA) 44 MCG/ACT inhaler Inhale 1 puff into the lungs 2 (two) times daily. 01/10/14  Yes Corey Harold, NP  guaiFENesin-dextromethorphan (ROBITUSSIN DM) 100-10 MG/5ML syrup Take 10 mLs by mouth every 4 (four) hours as needed for cough.   Yes Historical Provider, MD  hydrochlorothiazide (HYDRODIURIL) 12.5 MG tablet Take 12.5 mg by mouth daily. 11/06/15  Yes Historical Provider, MD  ipratropium-albuterol (DUONEB) 0.5-2.5 (3) MG/3ML SOLN Take 3 mLs by nebulization 2 (two) times daily as needed (tightness).   Yes Historical Provider, MD  predniSONE (DELTASONE) 20 MG tablet Take 2 tablets (40 mg total) by mouth daily. 11/13/15  Yes Merryl Hacker, MD    Family History No family history on file.  Social History Social History  Substance Use Topics  . Smoking status: Never Smoker  . Smokeless tobacco: Never Used  . Alcohol use No  Allergies   Ace inhibitors   Review of Systems Review of Systems  Respiratory: Positive for shortness of breath.   Cardiovascular: Positive for chest pain.   10 Systems reviewed and are negative for acute change except as noted in the HPI.   Physical Exam Updated Vital Signs BP 124/72 (BP Location: Right Arm)   Pulse 84   Resp 20   Ht 5\' 11"  (1.803 m)   Wt 242 lb (109.8 kg)   SpO2 98%   BMI 33.75 kg/m   Physical Exam  Constitutional: He is oriented to person, place, and time. He appears  well-developed and well-nourished. No distress.  HENT:  Head: Normocephalic and atraumatic.  Moist mucous membranes  Eyes: Conjunctivae are normal. Pupils are equal, round, and reactive to light.  Neck: Neck supple.  Cardiovascular: Normal rate, regular rhythm and normal heart sounds.   No murmur heard. Pulmonary/Chest: Effort normal. He has no wheezes.  Abdominal: Soft. Bowel sounds are normal. He exhibits no distension. There is no tenderness.  Musculoskeletal: He exhibits no edema.  Neurological: He is alert and oriented to person, place, and time.  Fluent speech  Skin: Skin is warm and dry.  Psychiatric: He has a normal mood and affect. Judgment normal.  Nursing note and vitals reviewed.    ED Treatments / Results  Labs (all labs ordered are listed, but only abnormal results are displayed) Labs Reviewed  BASIC METABOLIC PANEL - Abnormal; Notable for the following:       Result Value   Potassium 3.1 (*)    Glucose, Bld 112 (*)    All other components within normal limits  CBC WITH DIFFERENTIAL/PLATELET - Abnormal; Notable for the following:    Hemoglobin 12.3 (*)    HCT 37.2 (*)    All other components within normal limits  BRAIN NATRIURETIC PEPTIDE  I-STAT TROPOININ, ED    EKG  EKG Interpretation  Date/Time:  Tuesday November 20 2015 07:26:03 EST Ventricular Rate:  86 PR Interval:    QRS Duration: 96 QT Interval:  356 QTC Calculation: 426 R Axis:   48 Text Interpretation:  Sinus rhythm since previous tracing, rate slower Otherwise no significant change Confirmed by LITTLE MD, RACHEL [phone]) on 11/20/2015 7:52:23 AM       Radiology Dg Chest 2 View  Result Date: 11/20/2015 CLINICAL DATA:  Shortness of breath and chest tightness EXAM: CHEST  2 VIEW COMPARISON:  November 12, 2015 FINDINGS: There is no edema or consolidation. The heart size and pulmonary vascularity are normal. No adenopathy. There is degenerative change in the thoracic spine with diffuse idiopathic  skeletal hyperostosis. There is atherosclerotic calcification in the aorta. IMPRESSION: No edema or consolidation. Diffuse idiopathic skeletal hyperostosis. Aortic atherosclerosis. Electronically Signed   By: Lowella Grip III M.D.   On: 11/20/2015 08:05   Ct Angio Chest Pe W/cm &/or Wo Cm  Result Date: 11/20/2015 CLINICAL DATA:  Chest tightness and shortness of breath for 3 days. EXAM: CT ANGIOGRAPHY CHEST WITH CONTRAST TECHNIQUE: Multidetector CT imaging of the chest was performed using the standard protocol during bolus administration of intravenous contrast. Multiplanar CT image reconstructions and MIPs were obtained to evaluate the vascular anatomy. CONTRAST:  80 cc Isovue 370 intravenous COMPARISON:  12/29/2012 FINDINGS: Cardiovascular: Satisfactory opacification of the pulmonary arteries but limited by excessive respiratory motion at the bases. When accounting for motion artifact (left upper lobe vessel marked on 6:136 has an apparent filling defect which appears artifactual on reformats), no identified pulmonary embolism. Normal  heart size. No pericardial effusion. Negative thoracic aorta. Mediastinum/Nodes: No lymphadenopathy or mass.  Negative esophagus. Lungs/Pleura: Motion degraded evaluation. Minimal atelectasis at the bases. There is no edema, consolidation, effusion, or pneumothorax. Upper Abdomen: No acute finding Musculoskeletal: Confluent osteophytes with multilevel thoracic ankylosis. Adjacent segment T10-11 disc degeneration and facet arthropathy is advanced with bilateral foraminal stenosis. No acute or aggressive finding. Review of the MIP images confirms the above findings. IMPRESSION: Respiratory motion obscures multiple segmental vessels at the bases. No evidence of pulmonary embolism. No acute finding to explain shortness of breath. Electronically Signed   By: Monte Fantasia M.D.   On: 11/20/2015 11:06    Procedures Procedures (including critical care time)  Medications  Ordered in ED Medications  potassium chloride SA (K-DUR,KLOR-CON) CR tablet 40 mEq (40 mEq Oral Given 11/20/15 0954)  iopamidol (ISOVUE-370) 76 % injection (80 mLs  Contrast Given 11/20/15 1046)     Initial Impression / Assessment and Plan / ED Course  I have reviewed the triage vital signs and the nursing notes.  Pertinent labs & imaging results that were available during my care of the patient were reviewed by me and considered in my medical decision making (see chart for details).  Clinical Course    PT p/w several weeks of SOB worse w/ exertion and poor sleep because of it, no other infectious symptoms. Pt well appearing, normal VS and normal WOB. No wheezing. EKG without ischemic changes. No acute findings on CXR. Labs including BNP and troponin unlikely. Obtained CTA chest given persistence of symptoms and age, to r/o PE, occult pneumonia, or mass. CTA was negative for acute findings. I reviewed the patient's chart which shows that he has seen a pulmonologist, Dr. Annamaria Boots, in the clinic a few years ago. I have recommended that he follow up again with pulmonology for further workup of his shortness of breath. He denies any chest tightness, chest pain, or other symptoms with exertion to make me think that his symptoms are cardiac. His cardiac workup here as well as the last 2 visits has been reassuring. Given that his symptoms are worse at night, I suspect possible asthma or other pulmonary process which is why I recommended pulmonology follow-up. His vital signs and breathing are normal here. Regarding constipation, counseled patient on supportive care including Colace and MiraLAX. Return precautions reviewed and patient discharged in satisfactory condition.  Final Clinical Impressions(s) / ED Diagnoses   Final diagnoses:  Shortness of breath  Constipation, unspecified constipation type    New Prescriptions New Prescriptions   No medications on file     Sharlett Iles, MD 11/20/15  1138

## 2015-11-20 NOTE — ED Triage Notes (Signed)
Patient comes in with c/o central chest tightness and SOB that has occurred for over a week. Patient states he was here last week with the same complaints and sent home with prednisone. Patient states he took the medication and it helped with his congestion. Patient denies cough currently. Patient also c/o dizziness, nausea, and constipation. Hx of asthma and HTN. He had an episode of diarrhea 5 days ago after drinking prune juice and hasn't had a BM since. Patient has not tried a laxative or stool softer.

## 2015-11-20 NOTE — ED Notes (Signed)
Patient transported to CT 

## 2015-11-20 NOTE — Discharge Instructions (Signed)
1. Start taking colace, 1 capsule daily. 2. Take miralax, 1 capful mixed in drink, one to three times daily as needed for constipation. 3. Take zyrtec or claritin once daily for allergies.

## 2015-11-20 NOTE — ED Notes (Signed)
Patient d/c'd from IV, monitor, continuous pulse oximetry and blood pressure cuff; patient getting dressed to be discharged home; visitors at bedside

## 2015-12-03 ENCOUNTER — Encounter: Payer: Self-pay | Admitting: Adult Health

## 2015-12-03 ENCOUNTER — Ambulatory Visit (INDEPENDENT_AMBULATORY_CARE_PROVIDER_SITE_OTHER): Payer: Medicare Other | Admitting: Adult Health

## 2015-12-03 VITALS — BP 110/70 | HR 105 | Temp 97.8°F | Ht 71.0 in | Wt 239.2 lb

## 2015-12-03 DIAGNOSIS — J339 Nasal polyp, unspecified: Secondary | ICD-10-CM

## 2015-12-03 DIAGNOSIS — IMO0001 Reserved for inherently not codable concepts without codable children: Secondary | ICD-10-CM

## 2015-12-03 DIAGNOSIS — R0602 Shortness of breath: Secondary | ICD-10-CM

## 2015-12-03 DIAGNOSIS — J4520 Mild intermittent asthma, uncomplicated: Secondary | ICD-10-CM

## 2015-12-03 MED ORDER — BUDESONIDE-FORMOTEROL FUMARATE 80-4.5 MCG/ACT IN AERO: 2.0000 | INHALATION_SPRAY | Freq: Two times a day (BID) | RESPIRATORY_TRACT | 0 refills | Status: DC

## 2015-12-03 MED ORDER — AMOXICILLIN-POT CLAVULANATE 875-125 MG PO TABS: 1.0000 | ORAL_TABLET | Freq: Two times a day (BID) | ORAL | 0 refills | Status: DC

## 2015-12-03 MED ORDER — BUDESONIDE-FORMOTEROL FUMARATE 80-4.5 MCG/ACT IN AERO: 2.0000 | INHALATION_SPRAY | Freq: Two times a day (BID) | RESPIRATORY_TRACT | 4 refills | Status: DC

## 2015-12-03 MED ORDER — FLUTICASONE PROPIONATE 50 MCG/ACT NA SUSP: 2.0000 | Freq: Every day | NASAL | 2 refills | Status: DC

## 2015-12-03 MED ORDER — IPRATROPIUM-ALBUTEROL 0.5-2.5 (3) MG/3ML IN SOLN: 3.0000 mL | Freq: Two times a day (BID) | RESPIRATORY_TRACT | 2 refills | Status: DC | PRN

## 2015-12-03 NOTE — Assessment & Plan Note (Signed)
Sinusitis ,  Discussed steroid tx , he declines more prednisone .  Will try nasal steroids   Plan  Patient Instructions  Augmentin 875mg  Twice daily  For 10 days  Restart Flonase 2 puffs Twice daily  .  Stop Flovent  Begin Symbicort 80 2 puffs .Twice daily  , rinse after use.  Follow up Dr. Annamaria Boots  In 6 weeks and As needed  Please contact office for sooner follow up if symptoms do not improve or worsen or seek emergency care

## 2015-12-03 NOTE — Assessment & Plan Note (Signed)
Recurrent flare with AR/Sinusitis trigger  Plan   Patient Instructions  Augmentin 875mg  Twice daily  For 10 days  Restart Flonase 2 puffs Twice daily  .  Stop Flovent  Begin Symbicort 80 2 puffs .Twice daily  , rinse after use.  Follow up Dr. Annamaria Boots  In 6 weeks and As needed  Please contact office for sooner follow up if symptoms do not improve or worsen or seek emergency care

## 2015-12-03 NOTE — Patient Instructions (Addendum)
Augmentin 875mg  Twice daily  For 10 days  Restart Flonase 2 puffs Twice daily  .  Stop Flovent  Begin Symbicort 80 2 puffs .Twice daily  , rinse after use.  Follow up Dr. Annamaria Boots  In 6 weeks and As needed  Please contact office for sooner follow up if symptoms do not improve or worsen or seek emergency care

## 2015-12-03 NOTE — Addendum Note (Signed)
Addended by: Doroteo Glassman D on: 12/03/2015 12:28 PM   Modules accepted: Orders

## 2015-12-03 NOTE — Progress Notes (Signed)
Subjective:    Patient ID: [name] [name], male    DOB: 1940-10-19, 75 y.o.   MRN: [id number]  HPI 75 yo male never smoker with Asthma  And AR     12/03/2015 ER follow up :  Pt presents for an ER follow up . Was seen on 11/20/15 in ER for dyspnea, tx for asthma flare.  Multiple ER visits for bronchitis , dyspnea and chest tightness. He has been treated with prednisone and nebs Which do help but sx come back. Denies significant cough or wheezing . More dspnea. Has a lot of sinus congesiton and nasal drainage. Hard to breathe out of nose.  CT chest 11/20/15 was neg for PE, showed minimal atx in bases . No acute process.  Troponin neg. BNP nml. CBC unrevealing .  Unable to get FeNO testing today despite several attempts.  He is on Flovent for asthma .  Spirometry today is similar to past with FEV1 87%, ratio 68.   Following with Neurosurgeon, Dr. Vertell Limber following spinal lesion . Has balance issues.  Recent MRI C and T spine  . 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 Asthma   . Hypertension   . Seasonal allergies    Current Outpatient Prescriptions on File Prior to Visit  Medication Sig Dispense Refill  . albuterol (PROAIR HFA) 108 (90 BASE) MCG/ACT inhaler Inhale 1-2 puffs into the lungs every 4 (four) hours as needed for wheezing or shortness of breath.     Marland Kitchen amLODipine (NORVASC) 10 MG tablet Take 10 mg by mouth daily.    Marland Kitchen FLOVENT HFA 220 MCG/ACT inhaler Inhale 1 puff into the lungs 2 (two) times daily.    . hydrochlorothiazide (HYDRODIURIL) 12.5 MG tablet Take 12.5 mg by mouth daily.    Marland Kitchen ipratropium-albuterol (DUONEB) 0.5-2.5 (3) MG/3ML SOLN Take 3 mLs by nebulization 2 (two) times daily as needed (tightness).    . Chlorphen-Pseudoephed-APAP (CORICIDIN D PO) Take 2 tablets by mouth daily as needed (cold).    . fluticasone (FLONASE) 50 MCG/ACT nasal spray Place 2 sprays into both nostrils  daily. (Patient not taking: Reported on 12/03/2015) 16 g 2  . guaiFENesin-dextromethorphan (ROBITUSSIN DM) 100-10 MG/5ML syrup Take 10 mLs by mouth every 4 (four) hours as needed for cough.     No current facility-administered medications on file prior to visit.      Review of Systems Constitutional:   No  weight loss, night sweats,  Fevers, chills, fatigue, or  lassitude.  HEENT:   No headaches,  Difficulty swallowing,  Tooth/dental problems, or  Sore throat,                No sneezing, itching, ear ache,  +nasal congestion, post nasal drip,   CV:  No chest pain,  Orthopnea, PND, swelling in lower extremities, anasarca, dizziness, palpitations, syncope.   GI  No heartburn, indigestion, abdominal pain, nausea, vomiting, diarrhea, change in bowel habits, loss of appetite, bloody stools.   Resp:   No wheezing.  No chest wall deformity  Skin: no rash or lesions.  GU: no dysuria, change in color of urine, no urgency or frequency.  No flank pain, no hematuria   MS:  No joint pain or swelling.  No decreased range of motion.  No back pain.  Psych:  No change in mood or affect. No depression or anxiety.  No memory loss.  Objective:   Physical Exam Vitals:   12/03/15 1138  BP: 110/70  Pulse: (!) 105  Temp: 97.8 F (36.6 C)  TempSrc: Oral  SpO2: 98%  Weight: 239 lb 3.2 oz (108.5 kg)  Height: 5\' 11"  (1.803 m)   GEN: A/Ox3; pleasant , NAD, elderly    HEENT:  [place]/AT,  EACs-clear, TMs-wnl, NOSE-clear drainage , turbinate edema., THROAT-clear, no lesions, no postnasal drip or exudate noted.   NECK:  Supple w/ fair ROM; no JVD; normal carotid impulses w/o bruits; no thyromegaly or nodules palpated; no lymphadenopathy.    RESP  Clear  P & A; w/o, wheezes/ rales/ or rhonchi. no accessory muscle use, no dullness to percussion  CARD:  RRR, no m/r/g  , no peripheral edema, pulses intact, no cyanosis or clubbing.  GI:   Soft & nt; nml bowel sounds; no organomegaly or masses  detected.   Musco: Warm bil, no deformities or joint swelling noted.   Neuro: alert, no focal deficits noted.    Skin: Warm, no lesions or rashes  Caidence Higashi NP-C  [facility]  12/03/2015

## 2015-12-04 ENCOUNTER — Encounter (HOSPITAL_COMMUNITY): Payer: Self-pay

## 2015-12-04 ENCOUNTER — Emergency Department (HOSPITAL_COMMUNITY)
Admission: EM | Admit: 2015-12-04 | Discharge: 2015-12-04 | Disposition: A | Discharge status: Home / Self Care | Payer: Medicare Other | Attending: Emergency Medicine | Admitting: Emergency Medicine

## 2015-12-04 ENCOUNTER — Emergency Department (HOSPITAL_COMMUNITY): Payer: Medicare Other

## 2015-12-04 DIAGNOSIS — R0789 Other chest pain: Secondary | ICD-10-CM | POA: Diagnosis not present

## 2015-12-04 DIAGNOSIS — J4541 Moderate persistent asthma with (acute) exacerbation: Secondary | ICD-10-CM

## 2015-12-04 DIAGNOSIS — I10 Essential (primary) hypertension: Secondary | ICD-10-CM | POA: Diagnosis not present

## 2015-12-04 DIAGNOSIS — R0602 Shortness of breath: Secondary | ICD-10-CM | POA: Diagnosis not present

## 2015-12-04 DIAGNOSIS — Z79899 Other long term (current) drug therapy: Secondary | ICD-10-CM | POA: Diagnosis not present

## 2015-12-04 DIAGNOSIS — R079 Chest pain, unspecified: Secondary | ICD-10-CM | POA: Diagnosis not present

## 2015-12-04 DIAGNOSIS — R072 Precordial pain: Secondary | ICD-10-CM | POA: Diagnosis present

## 2015-12-04 LAB — COMPREHENSIVE METABOLIC PANEL
ALT: 15 U/L — AB (ref 17–63)
AST: 19 U/L (ref 15–41)
Albumin: 3.5 g/dL (ref 3.5–5.0)
Alkaline Phosphatase: 106 U/L (ref 38–126)
Anion gap: 10 (ref 5–15)
BUN: 11 mg/dL (ref 6–20)
CHLORIDE: 101 mmol/L (ref 101–111)
CO2: 25 mmol/L (ref 22–32)
CREATININE: 0.96 mg/dL (ref 0.61–1.24)
Calcium: 9.2 mg/dL (ref 8.9–10.3)
GFR calc non Af Amer: >60 mL/min (ref >60)
Glucose, Bld: 141 mg/dL — ABNORMAL HIGH (ref 65–99)
Potassium: 3.2 mmol/L — ABNORMAL LOW (ref 3.5–5.1)
SODIUM: 136 mmol/L (ref 135–145)
Total Bilirubin: 0.5 mg/dL (ref 0.3–1.2)
Total Protein: 6.6 g/dL (ref 6.5–8.1)

## 2015-12-04 LAB — CBC
HCT: 36.2 % — ABNORMAL LOW (ref 39.0–52.0)
Hemoglobin: 11.8 g/dL — ABNORMAL LOW (ref 13.0–17.0)
MCH: 26.8 pg (ref 26.0–34.0)
MCHC: 32.6 g/dL (ref 30.0–36.0)
MCV: 82.1 fL (ref 78.0–100.0)
PLATELETS: 175 10*3/uL (ref 150–400)
RBC: 4.41 MIL/uL (ref 4.22–5.81)
RDW: 14.4 % (ref 11.5–15.5)
WBC: 4.6 10*3/uL (ref 4.0–10.5)

## 2015-12-04 LAB — I-STAT TROPONIN, ED
Troponin i, poc: 0 ng/mL (ref 0.00–0.08)
Troponin i, poc: 0 ng/mL (ref 0.00–0.08)

## 2015-12-04 MED ORDER — ALBUTEROL SULFATE HFA 108 (90 BASE) MCG/ACT IN AERS
2.0000 | INHALATION_SPRAY | Freq: Once | RESPIRATORY_TRACT | Status: AC
  Administered 2015-12-04: 2 via RESPIRATORY_TRACT
  Filled 2015-12-04: qty 6.7

## 2015-12-04 MED ORDER — IPRATROPIUM-ALBUTEROL 0.5-2.5 (3) MG/3ML IN SOLN: 3.0000 mL | Freq: Once | RESPIRATORY_TRACT | Status: DC

## 2015-12-04 MED ORDER — IPRATROPIUM-ALBUTEROL 0.5-2.5 (3) MG/3ML IN SOLN
3.0000 mL | RESPIRATORY_TRACT | Status: AC
  Administered 2015-12-04 (×3): 3 mL via RESPIRATORY_TRACT
  Filled 2015-12-04: qty 6
  Filled 2015-12-04 (×2): qty 3

## 2015-12-04 MED ORDER — ALBUTEROL SULFATE (2.5 MG/3ML) 0.083% IN NEBU
2.5000 mg | INHALATION_SOLUTION | Freq: Once | RESPIRATORY_TRACT | Status: AC
  Administered 2015-12-04: 2.5 mg via RESPIRATORY_TRACT
  Filled 2015-12-04: qty 3

## 2015-12-04 NOTE — Progress Notes (Signed)
Pt took home Symbicort that he had at bedside.

## 2015-12-04 NOTE — ED Triage Notes (Signed)
Pt arrives EMS with c/o chest pain since last night. Pt took albuterol neb last night and this am. Pt given aspiriin 324mg  and nitro x 2 sl by EMS. Pt states chest pain decreased with nitro. Denies chest pain on arrival.

## 2015-12-04 NOTE — ED Provider Notes (Signed)
[facility] DEPT Provider Note   CSN: [id number] Arrival date & time: 12/04/15  [id number]     History   Chief Complaint Chief Complaint  Patient presents with  . Chest Pain    HPI [name] [name] is a 75 y.o. male.  The history is provided by the patient.  Chest Pain   This is a chronic (pt feels this is related to GI issues as it resolves with Tums. no acute changes.) problem. The problem occurs every several days. The problem has been resolved (pain free since yesterday). The pain is present in the substernal region. The pain is mild. Quality: tightness. The pain does not radiate. Associated symptoms include shortness of breath. Pertinent negatives include no cough and no sputum production.  His past medical history is significant for hypertension.  Pertinent negatives for past medical history include no COPD, no CHF, no diabetes, no DVT, no hyperlipidemia, no MI and no PE.  Shortness of Breath  This is a recurrent problem. Duration: several weeks. The problem occurs frequently.The current episode started yesterday (this episode). The problem has been gradually worsening. Associated symptoms include rhinorrhea and chest pain. Pertinent negatives include no cough and no sputum production. He has tried beta-agonist inhalers for the symptoms. The treatment provided mild relief. Associated medical issues include asthma. Associated medical issues do not include PE, past MI or DVT.   Saw Pulmonary yesterday and did "breathing test." reports feeling SOB following that. Had new inhaled medication change (placed on Symbicort).   Past Medical History:  Diagnosis Date  . Asthma   . Hypertension   . Seasonal allergies     Patient Active Problem List   Diagnosis Date Noted  . 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 Moderate intermittent asthma without complication 123456  . SOB (shortness of breath)   . Asthma exacerbation 01/09/2014    History reviewed. No pertinent surgical  history.     Home Medications    Prior to Admission medications   Medication Sig Start Date End Date Taking? Authorizing Provider  albuterol (PROAIR HFA) 108 (90 BASE) MCG/ACT inhaler Inhale 1-2 puffs into the lungs every 4 (four) hours as needed for wheezing or shortness of breath.    Yes Historical Provider, MD  amLODipine (NORVASC) 10 MG tablet Take 10 mg by mouth daily.   Yes Historical Provider, MD  amoxicillin-clavulanate (AUGMENTIN) 875-125 MG tablet Take 1 tablet by mouth 2 (two) times daily. 12/03/15  Yes Tammy S Parrett, NP  budesonide-formoterol (SYMBICORT) 80-4.5 MCG/ACT inhaler Inhale 2 puffs into the lungs 2 (two) times daily. 12/03/15  Yes Tammy S Parrett, NP  hydrochlorothiazide (HYDRODIURIL) 12.5 MG tablet Take 12.5 mg by mouth daily. 11/06/15  Yes Historical Provider, MD  ipratropium-albuterol (DUONEB) 0.5-2.5 (3) MG/3ML SOLN Take 3 mLs by nebulization 2 (two) times daily as needed (tightness). 12/03/15  Yes Tammy S Parrett, NP  fluticasone (FLONASE) 50 MCG/ACT nasal spray Place 2 sprays into both nostrils daily. Patient not taking: Reported on 12/04/2015 12/03/15   Melvenia Needles, NP    Family History No family history on file.  Social History Social History  Substance Use Topics  . Smoking status: Never Smoker  . Smokeless tobacco: Never Used  . Alcohol use No     Allergies   Ace inhibitors   Review of Systems Review of Systems  HENT: Positive for congestion and rhinorrhea.   Respiratory: Positive for shortness of breath. Negative for cough and sputum production.   Cardiovascular: Positive for chest pain.  Ten  systems are reviewed and are negative for acute change except as noted in the HPI    Physical Exam Updated Vital Signs BP 117/75 (BP Location: Right Arm)   Pulse 106   Temp 98.2 F (36.8 C) (Oral)   Resp 19   Ht 5\' 11"  (1.803 m)   Wt 239 lb (108.4 kg)   SpO2 100%   BMI 33.33 kg/m   Physical Exam  Constitutional: He is oriented to  person, place, and time. He appears well-developed and well-nourished. No distress.  HENT:  Head: Normocephalic and atraumatic.  Nose: Nose normal.  Eyes: Conjunctivae and EOM are normal. Pupils are equal, round, and reactive to light. Right eye exhibits no discharge. Left eye exhibits no discharge. No scleral icterus.  Neck: Normal range of motion. Neck supple.  Cardiovascular: Normal rate and regular rhythm.  Exam reveals no gallop and no friction rub.   No murmur heard. Pulmonary/Chest: Effort normal. No stridor. No respiratory distress. He has decreased breath sounds (poor air entry) in the right lower field and the left lower field. He has no wheezes. He has no rales.  Abdominal: Soft. He exhibits no distension. There is no tenderness.  Musculoskeletal: He exhibits no edema or tenderness.  Neurological: He is alert and oriented to person, place, and time.  Skin: Skin is warm and dry. No rash noted. He is not diaphoretic. No erythema.  Psychiatric: He has a normal mood and affect.  Vitals reviewed.    ED Treatments / Results  Labs (all labs ordered are listed, but only abnormal results are displayed) Labs Reviewed  COMPREHENSIVE METABOLIC PANEL - Abnormal; Notable for the following:       Result Value   Potassium 3.2 (*)    Glucose, Bld 141 (*)    ALT 15 (*)    All other components within normal limits  CBC - Abnormal; Notable for the following:    Hemoglobin 11.8 (*)    HCT 36.2 (*)    All other components within normal limits  I-STAT TROPOININ, ED  I-STAT TROPOININ, ED    EKG  EKG Interpretation  Date/Time:  Tuesday December 04 2015 07:41:04 EST Ventricular Rate:  107 PR Interval:    QRS Duration: 93 QT Interval:  330 QTC Calculation: 441 R Axis:   45 Text Interpretation:  Sinus tachycardia Prolonged PR interval Borderline T wave abnormalities No significant change since last tracing Confirmed by [facility] MD, Angelissa Supan ([id number]) on 12/04/2015 7:48:56 AM        Radiology Dg Chest 2 View  Result Date: 12/04/2015 CLINICAL DATA:  Shortness of Breath EXAM: CHEST  2 VIEW COMPARISON:  11/20/2015 FINDINGS: Cardiac shadow is within normal limits. The lungs are well aerated bilaterally. No focal infiltrate or sizable effusion is seen. Degenerative changes of the thoracic spine are again noted. IMPRESSION: No acute abnormality noted.  No change from the previous exam. Electronically Signed   By: Inez Catalina M.D.   On: 12/04/2015 08:20    Procedures Procedures (including critical care time)  Medications Ordered in ED Medications  albuterol (PROVENTIL HFA;VENTOLIN HFA) 108 (90 Base) MCG/ACT inhaler 2 puff (not administered)  ipratropium-albuterol (DUONEB) 0.5-2.5 (3) MG/3ML nebulizer solution 3 mL (3 mLs Nebulization Given 12/04/15 0856)  albuterol (PROVENTIL) (2.5 MG/3ML) 0.083% nebulizer solution 2.5 mg (2.5 mg Nebulization Given 12/04/15 0947)     Initial Impression / Assessment and Plan / ED Course  I have reviewed the triage vital signs and the nursing notes.  Pertinent labs & imaging  results that were available during my care of the patient were reviewed by me and considered in my medical decision making (see chart for details).  Clinical Course as of Dec 04 1198  Tue Dec 04, 2015  1136 Likely secondary to albuterol administration Potassium: (!) 3.2 [PC]    Clinical Course User Index [PC] Fatima Blank, MD    Asthma exacerbation with significant improvement in his symptomatology following breathing treatments. Patient has had several recurrences recently and has been prescribed prednisone. He reports that he has been having trouble sleeping on the prednisone and would like to try to refrain from using steroids for now.  Regarding the patient's chest pain, patient has been chest pain-free for over 24 hours per the patient. EKG without acute ischemic changes or evidence of pericarditis. Chest x-ray without evidence suggestive of  pneumonia, pneumothorax, pneumomediastinum.  No abnormal contour of the mediastinum to suggest dissection. No evidence of acute injuries. Troponins negative 2 at 3 hours apart. Feel this is appropriate to rule out ACS. Low suspicion for pulmonary embolism. Presentation not classic for aortic dissection or esophageal perforation.  Patient was instructed to follow-up with his primary care physician within 30 days for stress testing.  The patient is safe for discharge with strict return precautions.   Final Clinical Impressions(s) / ED Diagnoses   Final diagnoses:  Nonspecific chest pain  Moderate persistent asthma with acute exacerbation   Disposition: Discharge  Condition: Good  I have discussed the results, Dx and Tx plan with the patient who expressed understanding and agree(s) with the plan. Discharge instructions discussed at great length. The patient was given strict return precautions who verbalized understanding of the instructions. No further questions at time of discharge.    Current Discharge Medication List      Follow Up: Aura Dials, MD [phone] [address], [address] [place] [address] [phone]  Schedule an appointment as soon as possible for a visit  Within 30 days for stress testing.      Fatima Blank, MD 12/04/15 [phone]

## 2015-12-12 DIAGNOSIS — D649 Anemia, unspecified: Secondary | ICD-10-CM | POA: Diagnosis not present

## 2015-12-12 DIAGNOSIS — E876 Hypokalemia: Secondary | ICD-10-CM | POA: Diagnosis not present

## 2015-12-12 DIAGNOSIS — R739 Hyperglycemia, unspecified: Secondary | ICD-10-CM | POA: Diagnosis not present

## 2015-12-12 DIAGNOSIS — J4521 Mild intermittent asthma with (acute) exacerbation: Secondary | ICD-10-CM | POA: Diagnosis not present

## 2015-12-20 ENCOUNTER — Other Ambulatory Visit: Payer: Self-pay | Admitting: Neurosurgery

## 2015-12-20 DIAGNOSIS — D497 Neoplasm of unspecified behavior of endocrine glands and other parts of nervous system: Secondary | ICD-10-CM

## 2016-01-23 DIAGNOSIS — I10 Essential (primary) hypertension: Secondary | ICD-10-CM | POA: Diagnosis not present

## 2016-01-23 DIAGNOSIS — R739 Hyperglycemia, unspecified: Secondary | ICD-10-CM | POA: Diagnosis not present

## 2016-01-23 DIAGNOSIS — J4520 Mild intermittent asthma, uncomplicated: Secondary | ICD-10-CM | POA: Diagnosis not present

## 2016-01-23 DIAGNOSIS — J0110 Acute frontal sinusitis, unspecified: Secondary | ICD-10-CM | POA: Diagnosis not present

## 2016-01-23 DIAGNOSIS — G959 Disease of spinal cord, unspecified: Secondary | ICD-10-CM | POA: Diagnosis not present

## 2016-01-28 ENCOUNTER — Ambulatory Visit
Admission: RE | Admit: 2016-01-28 | Discharge: 2016-01-28 | Disposition: A | Discharge status: Home / Self Care | Payer: Medicare Other | Source: Ambulatory Visit | Attending: Neurosurgery | Admitting: Neurosurgery

## 2016-01-28 DIAGNOSIS — D497 Neoplasm of unspecified behavior of endocrine glands and other parts of nervous system: Secondary | ICD-10-CM

## 2016-01-28 DIAGNOSIS — C720 Malignant neoplasm of spinal cord: Secondary | ICD-10-CM | POA: Diagnosis not present

## 2016-01-28 MED ORDER — GADOBENATE DIMEGLUMINE 529 MG/ML IV SOLN
20.0000 mL | Freq: Once | INTRAVENOUS | Status: AC | PRN
  Administered 2016-01-28: 20 mL via INTRAVENOUS

## 2016-01-30 ENCOUNTER — Ambulatory Visit: Payer: Medicare Other | Admitting: Internal Medicine

## 2016-02-04 ENCOUNTER — Emergency Department (HOSPITAL_COMMUNITY): Payer: Medicare Other

## 2016-02-04 ENCOUNTER — Emergency Department (HOSPITAL_COMMUNITY)
Admission: EM | Admit: 2016-02-04 | Discharge: 2016-02-04 | Disposition: A | Discharge status: Home / Self Care | Payer: Medicare Other | Attending: Emergency Medicine | Admitting: Emergency Medicine

## 2016-02-04 ENCOUNTER — Encounter (HOSPITAL_COMMUNITY): Payer: Self-pay

## 2016-02-04 DIAGNOSIS — Z79899 Other long term (current) drug therapy: Secondary | ICD-10-CM | POA: Insufficient documentation

## 2016-02-04 DIAGNOSIS — R6889 Other general symptoms and signs: Secondary | ICD-10-CM | POA: Diagnosis not present

## 2016-02-04 DIAGNOSIS — I10 Essential (primary) hypertension: Secondary | ICD-10-CM | POA: Diagnosis not present

## 2016-02-04 DIAGNOSIS — R232 Flushing: Secondary | ICD-10-CM

## 2016-02-04 DIAGNOSIS — R2689 Other abnormalities of gait and mobility: Secondary | ICD-10-CM | POA: Diagnosis not present

## 2016-02-04 DIAGNOSIS — J069 Acute upper respiratory infection, unspecified: Secondary | ICD-10-CM | POA: Diagnosis not present

## 2016-02-04 DIAGNOSIS — T50905A Adverse effect of unspecified drugs, medicaments and biological substances, initial encounter: Secondary | ICD-10-CM | POA: Diagnosis not present

## 2016-02-04 DIAGNOSIS — R0602 Shortness of breath: Secondary | ICD-10-CM | POA: Diagnosis not present

## 2016-02-04 DIAGNOSIS — J45909 Unspecified asthma, uncomplicated: Secondary | ICD-10-CM | POA: Insufficient documentation

## 2016-02-04 LAB — CBC WITH DIFFERENTIAL/PLATELET
BASOS ABS: 0 10*3/uL (ref 0.0–0.1)
BASOS PCT: 0 %
EOS ABS: 0.1 10*3/uL (ref 0.0–0.7)
EOS PCT: 2 %
HCT: 37.8 % — ABNORMAL LOW (ref 39.0–52.0)
Hemoglobin: 12.3 g/dL — ABNORMAL LOW (ref 13.0–17.0)
LYMPHS PCT: 30 %
Lymphs Abs: 1.6 10*3/uL (ref 0.7–4.0)
MCH: 26.8 pg (ref 26.0–34.0)
MCHC: 32.5 g/dL (ref 30.0–36.0)
MCV: 82.4 fL (ref 78.0–100.0)
Monocytes Absolute: 0.4 10*3/uL (ref 0.1–1.0)
Monocytes Relative: 7 %
Neutro Abs: 3.4 10*3/uL (ref 1.7–7.7)
Neutrophils Relative %: 61 %
PLATELETS: 204 10*3/uL (ref 150–400)
RBC: 4.59 MIL/uL (ref 4.22–5.81)
RDW: 15.3 % (ref 11.5–15.5)
WBC: 5.5 10*3/uL (ref 4.0–10.5)

## 2016-02-04 LAB — COMPREHENSIVE METABOLIC PANEL
ALBUMIN: 3.9 g/dL (ref 3.5–5.0)
ALT: 32 U/L (ref 17–63)
AST: 34 U/L (ref 15–41)
Alkaline Phosphatase: 118 U/L (ref 38–126)
Anion gap: 10 (ref 5–15)
BUN: 12 mg/dL (ref 6–20)
CHLORIDE: 101 mmol/L (ref 101–111)
CO2: 27 mmol/L (ref 22–32)
CREATININE: 0.98 mg/dL (ref 0.61–1.24)
Calcium: 9.6 mg/dL (ref 8.9–10.3)
GFR calc Af Amer: >60 mL/min (ref >60)
GFR calc non Af Amer: >60 mL/min (ref >60)
GLUCOSE: 108 mg/dL — AB (ref 65–99)
Potassium: 3.4 mmol/L — ABNORMAL LOW (ref 3.5–5.1)
SODIUM: 138 mmol/L (ref 135–145)
Total Bilirubin: 0.9 mg/dL (ref 0.3–1.2)
Total Protein: 7.1 g/dL (ref 6.5–8.1)

## 2016-02-04 LAB — I-STAT TROPONIN, ED: Troponin i, poc: 0.02 ng/mL (ref 0.00–0.08)

## 2016-02-04 NOTE — ED Provider Notes (Signed)
[facility] DEPT Provider Note   CSN: [id number] Arrival date & time: 02/04/16  [id number]     History   Chief Complaint Chief Complaint  Patient presents with  . Shortness of Breath  . Gait Problem    HPI [name] [name] is a 76 y.o. male.  HPI   Dry throat, forehead feels hot. When walk feel like balance is off. Doctor had rx sinus medication last week and it's not helping.  Mouth dry and when stand up feeling off balance but will walk a few steps and it will improve.  Has felt off balance over last 4 days, began slowly present when going from sitting to standing.  Stool hard and having to strain a lot, taking miralax without help. Feeling like feverish and balance biggest concerns. No numbness/weakness/no other coordination problems, no new tremor, no change in vision. Did have dyspnea but improved with nebulizer this morning No chest pain, no dyspnea at this time. No diaphoresis. Started tylenol-coricidin and amoxicillin   Past Medical History:  Diagnosis Date  . Asthma   . Hypertension   . Seasonal allergies     Patient Active Problem List   Diagnosis Date Noted  . 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 Moderate intermittent asthma without complication 123456  . SOB (shortness of breath)   . Asthma exacerbation 01/09/2014    History reviewed. No pertinent surgical history.     Home Medications    Prior to Admission medications   Medication Sig Start Date End Date Taking? Authorizing Provider  albuterol (PROAIR HFA) 108 (90 BASE) MCG/ACT inhaler Inhale 1-2 puffs into the lungs every 4 (four) hours as needed for wheezing or shortness of breath.    Yes Historical Provider, MD  amLODipine (NORVASC) 10 MG tablet Take 10 mg by mouth daily.   Yes Historical Provider, MD  hydrochlorothiazide (HYDRODIURIL) 12.5 MG tablet Take 12.5 mg by mouth daily. 11/06/15  Yes Historical Provider, MD  ipratropium-albuterol (DUONEB) 0.5-2.5 (3) MG/3ML SOLN Take 3 mLs by nebulization 2 (two)  times daily as needed (tightness). 12/03/15  Yes Tammy S Parrett, NP  budesonide-formoterol (SYMBICORT) 80-4.5 MCG/ACT inhaler Inhale 2 puffs into the lungs 2 (two) times daily. Patient not taking: Reported on 02/04/2016 12/03/15   Tammy S Parrett, NP  fluticasone (FLONASE) 50 MCG/ACT nasal spray Place 2 sprays into both nostrils daily. Patient not taking: Reported on 12/04/2015 12/03/15   Melvenia Needles, NP    Family History No family history on file.  Social History Social History  Substance Use Topics  . Smoking status: Never Smoker  . Smokeless tobacco: Never Used  . Alcohol use No     Allergies   Ace inhibitors   Review of Systems Review of Systems  Constitutional: Positive for fever (feverish).  HENT: Positive for rhinorrhea. Negative for sore throat (not sore but dry).   Eyes: Negative for visual disturbance.  Respiratory: Negative for cough and shortness of breath.   Cardiovascular: Negative for chest pain.  Gastrointestinal: Positive for constipation. Negative for abdominal pain, diarrhea, nausea and vomiting.  Genitourinary: Negative for difficulty urinating.  Musculoskeletal: Negative for back pain and neck stiffness.  Skin: Negative for rash.  Neurological: Positive for light-headedness. Negative for dizziness, syncope, facial asymmetry, speech difficulty, weakness, numbness and headaches.     Physical Exam Updated Vital Signs BP 118/75 (BP Location: Right Arm)   Pulse 82   Temp 97.3 F (36.3 C) (Oral) Comment: pt was chewing gum  Resp 16   Ht 5\' 11"  (  1.803 m)   Wt 240 lb (108.9 kg)   SpO2 96%   BMI 33.47 kg/m   Physical Exam  Constitutional: He is oriented to person, place, and time. He appears well-developed and well-nourished. No distress.  HENT:  Head: Normocephalic and atraumatic.  Eyes: Conjunctivae and EOM are normal.  Neck: Normal range of motion.  Cardiovascular: Normal rate, regular rhythm, normal heart sounds and intact distal pulses.   Exam reveals no gallop and no friction rub.   No murmur heard. Pulmonary/Chest: Effort normal and breath sounds normal. No respiratory distress. He has no wheezes. He has no rales.  Abdominal: Soft. He exhibits no distension. There is no tenderness. There is no guarding.  Musculoskeletal: He exhibits no edema.  Neurological: He is alert and oriented to person, place, and time. He displays tremor (chronic per pt and wife). No cranial nerve deficit. He displays a negative Romberg sign. Coordination (normal finger to nose and heel to chin) and gait normal.  Skin: Skin is warm and dry. He is not diaphoretic.  Nursing note and vitals reviewed.    ED Treatments / Results  Labs (all labs ordered are listed, but only abnormal results are displayed) Labs Reviewed  CBC WITH DIFFERENTIAL/PLATELET - Abnormal; Notable for the following:       Result Value   Hemoglobin 12.3 (*)    HCT 37.8 (*)    All other components within normal limits  COMPREHENSIVE METABOLIC PANEL - Abnormal; Notable for the following:    Potassium 3.4 (*)    Glucose, Bld 108 (*)    All other components within normal limits  I-STAT TROPOININ, ED    EKG  EKG Interpretation  Date/Time:  Monday February 04 2016 06:47:22 EST Ventricular Rate:  115 PR Interval:  216 QRS Duration: 90 QT Interval:  304 QTC Calculation: 420 R Axis:   70 Text Interpretation:  Sinus tachycardia with 1st degree A-V block Otherwise normal ECG When compared with ECG of 12/04/2015, No significant change was found Confirmed by [facility]  MD, DAVID ([id number]) on 02/04/2016 6:58:14 AM       Radiology Dg Chest 2 View  Result Date: 02/04/2016 CLINICAL DATA:  Shortness of breath. EXAM: CHEST  2 VIEW COMPARISON:  12/04/2015.  11/12/2015.  01/09/2014 . FINDINGS: Mediastinum and hilar structures are normal. Heart size stable. Mild left base subsegmental atelectasis and/or scarring again noted. Stable tiny nodular opacity noted over the right upper lobe consistent  with small granuloma or tiny focus of pleural-parenchymal scarring. No pleural effusion or pneumothorax. IMPRESSION: 1. No acute cardiopulmonary disease. 2. Stable mild left base subsegmental atelectasis and/or scarring. Stable nodular opacity over the right apex, unchanged from 01/09/2014. Findings most consistent with small granuloma or tiny focus of pleuroparenchymal scarring. Electronically Signed   By: Marcello Moores  Register   On: 02/04/2016 07:14    Procedures Procedures (including critical care time)  Medications Ordered in ED Medications - No data to display   Initial Impression / Assessment and Plan / ED Course  I have reviewed the triage vital signs and the nursing notes.  Pertinent labs & imaging results that were available during my care of the patient were reviewed by me and considered in my medical decision making (see chart for details).     76 year old male presents with concern for sensation that his head feels hot, feeling off balance when he goes from sitting to standing.  Regarding patient's feeling of being off balance, he has no signs of neurologic abnormality on exam,  has a normal gait, normal finger to nose, no change in chronic tremor, no visual changes, negative Romberg, and have low suspicion for CVA. He denies any chest pain and overall low suspicion for ACS.  EKG without significant abnormalities. CXR without acute findings.  Orthostatics checked and normal. Patient reports feeling warm, as a feeling of fever, however is afebrile in the emergency department, denies infectious symptoms other than congestion, doubt influenza.  Labs done to screen for cardiac or other electrolyte abnormalities given nonspecific feeling of warmth and show no acute abnormalities.  Symptoms may be side effect of coricidin and viral infection. Recommend discontinuing coricidin, PCP follow up, return to ED for development of CP, dyspnea, other concerns.  Final Clinical Impressions(s) / ED Diagnoses     Final diagnoses:  Head feels warm  Balance problem, feeling off balance when starting to walk  Upper respiratory tract infection, unspecified type  Adverse effect of drug, initial encounter    New Prescriptions Discharge Medication List as of 02/04/2016 12:11 PM       Gareth Morgan, MD 02/04/16 1827

## 2016-02-04 NOTE — ED Notes (Signed)
EDP at bedside  

## 2016-02-04 NOTE — ED Triage Notes (Signed)
Pt states he started having sweats about 4 days ago and and tonight he started getting a HA which lead to SOB; pt states he has unsteady gait which started tonight; Pt a&ox 4 on arrival; Pt speaking in full sentences at triage

## 2016-02-04 NOTE — ED Triage Notes (Signed)
Pt states he has no pain but just feels like his head is warm

## 2016-02-04 NOTE — ED Notes (Signed)
Attempted to collect blood, able to obtain one tube. Will call phlebotomy.

## 2016-02-04 NOTE — ED Notes (Signed)
Phlebotomy at bedside.

## 2016-02-13 DIAGNOSIS — M545 Low back pain: Secondary | ICD-10-CM | POA: Diagnosis not present

## 2016-02-13 DIAGNOSIS — M5416 Radiculopathy, lumbar region: Secondary | ICD-10-CM | POA: Diagnosis not present

## 2016-02-13 DIAGNOSIS — M48061 Spinal stenosis, lumbar region without neurogenic claudication: Secondary | ICD-10-CM | POA: Diagnosis not present

## 2016-02-13 DIAGNOSIS — D497 Neoplasm of unspecified behavior of endocrine glands and other parts of nervous system: Secondary | ICD-10-CM | POA: Diagnosis not present

## 2016-03-05 ENCOUNTER — Emergency Department (HOSPITAL_COMMUNITY)
Admission: EM | Admit: 2016-03-05 | Discharge: 2016-03-05 | Disposition: A | Discharge status: Home / Self Care | Payer: Medicare Other | Attending: Emergency Medicine | Admitting: Emergency Medicine

## 2016-03-05 ENCOUNTER — Encounter (HOSPITAL_COMMUNITY): Payer: Self-pay

## 2016-03-05 DIAGNOSIS — R066 Hiccough: Secondary | ICD-10-CM | POA: Insufficient documentation

## 2016-03-05 DIAGNOSIS — Z79899 Other long term (current) drug therapy: Secondary | ICD-10-CM | POA: Insufficient documentation

## 2016-03-05 DIAGNOSIS — I10 Essential (primary) hypertension: Secondary | ICD-10-CM | POA: Insufficient documentation

## 2016-03-05 DIAGNOSIS — J45909 Unspecified asthma, uncomplicated: Secondary | ICD-10-CM | POA: Diagnosis not present

## 2016-03-05 MED ORDER — CHLORPROMAZINE HCL 25 MG PO TABS
25.0000 mg | ORAL_TABLET | Freq: Once | ORAL | Status: AC
  Administered 2016-03-05: 25 mg via ORAL
  Filled 2016-03-05: qty 1

## 2016-03-05 MED ORDER — CHLORPROMAZINE HCL 25 MG PO TABS: 25.0000 mg | ORAL_TABLET | Freq: Three times a day (TID) | ORAL | 1 refills | Status: DC

## 2016-03-05 NOTE — ED Triage Notes (Signed)
Pt endorses hiccups that began yesterday. Pt seen here in the past for the same and was told to eat peanut butter which resolved but it is not working this time. VSS. Denies shob, airway intact.

## 2016-03-05 NOTE — ED Provider Notes (Signed)
[facility] DEPT Provider Note   CSN: [id number] Arrival date & time: 03/05/16  1615     History   Chief Complaint Chief Complaint  Patient presents with  . Hiccups    HPI [name] [name] is a 76 y.o. male.  HPI Patient reports he's had hiccups for 2 days. They just started randomly. He can't associate with anything particular that he either did or ate. He reports he has tried multiple things make them stop. He reports about 2 years ago something similar happened and the doctor had immediate peanut butter. He reports it worked at that time but he has gotten no relief trying at this time. She has been trying chewing gum and using his inhaler. He denies any fever or any pain or any other positives on review systems. Past Medical History:  Diagnosis Date  . Asthma   . Hypertension   . Seasonal allergies     Patient Active Problem List   Diagnosis Date Noted  . 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 Moderate intermittent asthma without complication 123456  . SOB (shortness of breath)   . Asthma exacerbation 01/09/2014    History reviewed. No pertinent surgical history.     Home Medications    Prior to Admission medications   Medication Sig Start Date End Date Taking? Authorizing Provider  albuterol (PROAIR HFA) 108 (90 BASE) MCG/ACT inhaler Inhale 1-2 puffs into the lungs every 4 (four) hours as needed for wheezing or shortness of breath.     Historical Provider, MD  amLODipine (NORVASC) 10 MG tablet Take 10 mg by mouth daily.    Historical Provider, MD  budesonide-formoterol (SYMBICORT) 80-4.5 MCG/ACT inhaler Inhale 2 puffs into the lungs 2 (two) times daily. Patient not taking: Reported on 02/04/2016 12/03/15   Fonnie Mu Parrett, NP  chlorproMAZINE (THORAZINE) 25 MG tablet Take 1 tablet (25 mg total) by mouth 3 (three) times daily. 03/05/16   Charlesetta Shanks, MD  fluticasone (FLONASE) 50 MCG/ACT nasal spray Place 2 sprays into both nostrils daily. Patient not taking: Reported on  12/04/2015 12/03/15   Tammy S Parrett, NP  hydrochlorothiazide (HYDRODIURIL) 12.5 MG tablet Take 12.5 mg by mouth daily. 11/06/15   Historical Provider, MD  ipratropium-albuterol (DUONEB) 0.5-2.5 (3) MG/3ML SOLN Take 3 mLs by nebulization 2 (two) times daily as needed (tightness). 12/03/15   Melvenia Needles, NP    Family History History reviewed. No pertinent family history.  Social History Social History  Substance Use Topics  . Smoking status: Never Smoker  . Smokeless tobacco: Never Used  . Alcohol use No     Allergies   Ace inhibitors   Review of Systems Review of Systems 10 Systems reviewed and are negative for acute change except as noted in the HPI.   Physical Exam Updated Vital Signs BP 135/82 (BP Location: Left Arm)   Pulse 104   Temp 98.4 F (36.9 C) (Oral)   Resp 18   Ht 5\' 11"  (1.803 m)   Wt 240 lb (108.9 kg)   SpO2 99%   BMI 33.47 kg/m   Physical Exam  Constitutional: He is oriented to person, place, and time.  Patient is alert and nontoxic. He is having recurrent hiccup. No respiratory distress.   HENT:  Head: Normocephalic and atraumatic.  Nose: Nose normal.  Posterior oropharynx is widely patent. Uvula is midline. No appreciable mass or tumor.  Eyes: EOM are normal.  Neck: Neck supple. No tracheal deviation present.  Cardiovascular: Normal rate, regular rhythm, normal heart  sounds and intact distal pulses.   Pulmonary/Chest: Effort normal and breath sounds normal.  Abdominal: Soft. He exhibits no distension. There is no tenderness.  Musculoskeletal: Normal range of motion.  Lymphadenopathy:    He has no cervical adenopathy.  Neurological: He is alert and oriented to person, place, and time. He exhibits normal muscle tone. Coordination normal.  Skin: Skin is warm and dry.  Psychiatric: He has a normal mood and affect.     ED Treatments / Results  Labs (all labs ordered are listed, but only abnormal results are displayed) Labs Reviewed - No  data to display  EKG  EKG Interpretation None       Radiology No results found.  Procedures Procedures (including critical care time)  Medications Ordered in ED Medications  chlorproMAZINE (THORAZINE) tablet 25 mg (25 mg Oral Given 03/05/16 1817)     Initial Impression / Assessment and Plan / ED Course  I have reviewed the triage vital signs and the nursing notes.  Pertinent labs & imaging results that were available during my care of the patient were reviewed by me and considered in my medical decision making (see chart for details).       Final Clinical Impressions(s) / ED Diagnoses   Final diagnoses:  Hiccups   Patient does not have identifiable cause for hiccups at this point time. He has had CT of the chest done within less than a year. No mass or tumor was identified there. Review systems otherwise negative. Patient got temporary relief of approximately 30 minutes from a teaspoon of sugar and ice water gargle. The hiccups However Returned. They did not resolve with valsalva maneuver. Patient will be treated empirically with low-dose chlorpromazine. He is instructed to follow-up with his family doctor within the next 2 days. New Prescriptions New Prescriptions   CHLORPROMAZINE (THORAZINE) 25 MG TABLET    Take 1 tablet (25 mg total) by mouth 3 (three) times daily.     Charlesetta Shanks, MD 03/05/16 [phone]

## 2016-03-05 NOTE — ED Notes (Signed)
See edp assessment 

## 2016-03-05 NOTE — ED Notes (Signed)
Pt gargled ice water and ate a teaspoon of sugar with no relief, edp notified

## 2016-03-06 ENCOUNTER — Other Ambulatory Visit: Payer: Self-pay

## 2016-03-06 NOTE — Patient Outreach (Signed)
[facility]) Care Management  03/06/2016  GUTHRIE CRISP Nov 25, 1940 [id number]   Telephone call to patient for high ED utilization referral.  No answer.  HIPAA compliant voice message left.  Plan: RN Health Coach will attempt patient again in the next ten business days.   [name], RN, MSN [place] [phone]

## 2016-03-10 ENCOUNTER — Other Ambulatory Visit: Payer: Self-pay

## 2016-03-10 NOTE — Patient Outreach (Signed)
[facility]) Care Management  03/10/2016  [name] [name] 09-12-1940 [id number]   Telephone call to patient for ED utilization referral.  No answer.  HIPAA compliant voice message left.  Plan: RN Health Coach will attempt patient again in the next 10 business days.    [name], RN, MSN [place] [phone]

## 2016-03-12 ENCOUNTER — Other Ambulatory Visit: Payer: Self-pay

## 2016-03-12 ENCOUNTER — Ambulatory Visit: Payer: Self-pay

## 2016-03-12 NOTE — Patient Outreach (Signed)
[facility]) Care Management  03/12/2016  [name] [name] 01-19-1940 [id number]  3rd telephone call to patient for high ED utilization referral.  No answer.  HIPAA compliant voice message left.  Plan: RN Health Coach will send letter to attempt outreach.    [name], RN, MSN [place] [phone]

## 2016-03-19 ENCOUNTER — Telehealth: Payer: Self-pay

## 2016-03-19 NOTE — Telephone Encounter (Signed)
Received PA request from [facility] for duoneb, that was prescribed on 12/03/15 by TP. I have spoken with Gaspar Bidding with [facility], who states pt picked Rx up when originally prescribed without need for PA. Due to new calender year PA is needed. PA has been initiated through [facility]. Will await determination.  [id number]

## 2016-03-26 NOTE — Telephone Encounter (Signed)
Ipratropium-albuterol 0.5-2.5 (3)MG/3ML solution prior authorization was denied on 03/19/16. TP please advise if you would like to appeal denial or change medication. Thank you!   PA Key: Deatra Canter

## 2016-03-26 NOTE — Telephone Encounter (Signed)
That is weird , never seen that before .  Yes can sent in PA or see if you separate them if they go in .  According to my records this is As needed  Medication.

## 2016-03-27 NOTE — Telephone Encounter (Signed)
Pt is 75 on Medicare Ideally neb meds should be sent to a DME company  LMOM TCB x1 to inform pt of this before sending rx

## 2016-03-28 NOTE — Telephone Encounter (Signed)
Called patient and left message to contact office to get update on medication.

## 2016-03-31 ENCOUNTER — Other Ambulatory Visit: Payer: Self-pay

## 2016-03-31 NOTE — Patient Outreach (Signed)
[facility]) Care Management  03/31/2016  [name] [name] Sep 07, 1940 [id number]   Patient has not responded after call and letter.  Will proceed with case closure.  Will notify care management assistant of case closure.  [name], RN, MSN [place] [phone]

## 2016-04-14 NOTE — Telephone Encounter (Signed)
Contacted patient to see who his DME company was. Pt did not answer. Left message for patient to contact office.

## 2016-04-17 NOTE — Telephone Encounter (Signed)
Contacted patient and wife stated he is unavailable, but will be in an hour or so. Will try again at later time.

## 2016-04-21 NOTE — Telephone Encounter (Signed)
Left message for patient to contact office regarding medication.

## 2016-04-29 NOTE — Telephone Encounter (Signed)
Letter is being sent to address on file for patient to contact office at earliest convenience.

## 2016-04-29 NOTE — Telephone Encounter (Signed)
Contacted patient and received busy dial tone. Will try again at later date.

## 2016-05-21 NOTE — Telephone Encounter (Signed)
Spoke with patient who stated he will no longer be coming to the practice. Will shred PA. Nothing further is needed.

## 2016-07-29 DIAGNOSIS — I10 Essential (primary) hypertension: Secondary | ICD-10-CM | POA: Diagnosis not present

## 2016-07-29 DIAGNOSIS — G959 Disease of spinal cord, unspecified: Secondary | ICD-10-CM | POA: Diagnosis not present

## 2016-07-29 DIAGNOSIS — R739 Hyperglycemia, unspecified: Secondary | ICD-10-CM | POA: Diagnosis not present

## 2016-07-29 DIAGNOSIS — Z9109 Other allergy status, other than to drugs and biological substances: Secondary | ICD-10-CM | POA: Diagnosis not present

## 2016-09-29 DIAGNOSIS — J4530 Mild persistent asthma, uncomplicated: Secondary | ICD-10-CM | POA: Diagnosis not present

## 2016-09-29 DIAGNOSIS — I10 Essential (primary) hypertension: Secondary | ICD-10-CM | POA: Diagnosis not present

## 2016-09-29 DIAGNOSIS — J309 Allergic rhinitis, unspecified: Secondary | ICD-10-CM | POA: Diagnosis not present

## 2016-09-29 DIAGNOSIS — E7800 Pure hypercholesterolemia, unspecified: Secondary | ICD-10-CM | POA: Diagnosis not present

## 2016-09-29 DIAGNOSIS — E669 Obesity, unspecified: Secondary | ICD-10-CM | POA: Diagnosis not present

## 2016-11-10 DIAGNOSIS — Z0000 Encounter for general adult medical examination without abnormal findings: Secondary | ICD-10-CM | POA: Diagnosis not present

## 2016-11-10 DIAGNOSIS — I10 Essential (primary) hypertension: Secondary | ICD-10-CM | POA: Diagnosis not present

## 2016-11-10 DIAGNOSIS — Z7189 Other specified counseling: Secondary | ICD-10-CM | POA: Diagnosis not present

## 2016-11-10 DIAGNOSIS — E669 Obesity, unspecified: Secondary | ICD-10-CM | POA: Diagnosis not present

## 2016-11-10 DIAGNOSIS — J4530 Mild persistent asthma, uncomplicated: Secondary | ICD-10-CM | POA: Diagnosis not present

## 2016-11-10 DIAGNOSIS — Z1389 Encounter for screening for other disorder: Secondary | ICD-10-CM | POA: Diagnosis not present

## 2016-11-10 DIAGNOSIS — R739 Hyperglycemia, unspecified: Secondary | ICD-10-CM | POA: Diagnosis not present

## 2016-11-10 DIAGNOSIS — Z6834 Body mass index (BMI) 34.0-34.9, adult: Secondary | ICD-10-CM | POA: Diagnosis not present

## 2017-03-25 IMAGING — CT CT ANGIO CHEST
2 of 6 series · 18 of 36 positions shown · IV contrast (isovue)
Comparison: 12/29/2012

CLINICAL DATA: Chest tightness and shortness of breath for 3 days.

EXAM:
CT ANGIOGRAPHY CHEST WITH CONTRAST
TECHNIQUE: Multidetector CT imaging of the chest was performed using the
standard protocol during bolus administration of intravenous
contrast. Multiplanar CT image reconstructions and MIPs were
obtained to evaluate the vascular anatomy.
CONTRAST:  80 cc Isovue 370 intravenous

[Series 6: pe thins · axial · 0.75mm/px · z∈[-555,-293]mm · 17 of 296 slices shown]
[im 17/296  lung]
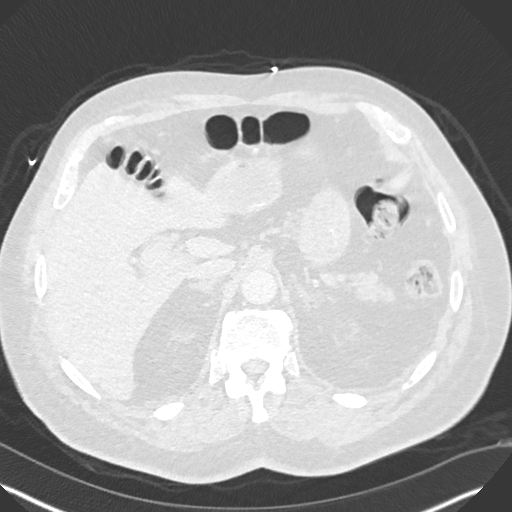
[im 33/296  mediastinal]
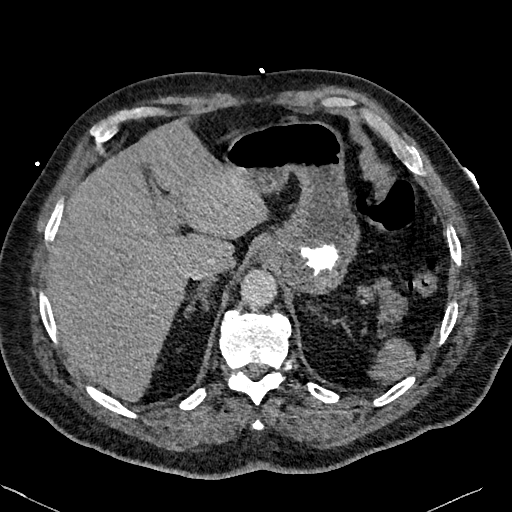
[im 50/296  lung]
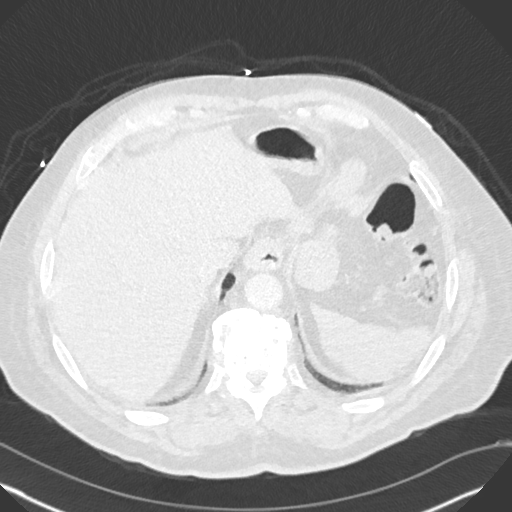
[im 66/296  mediastinal]
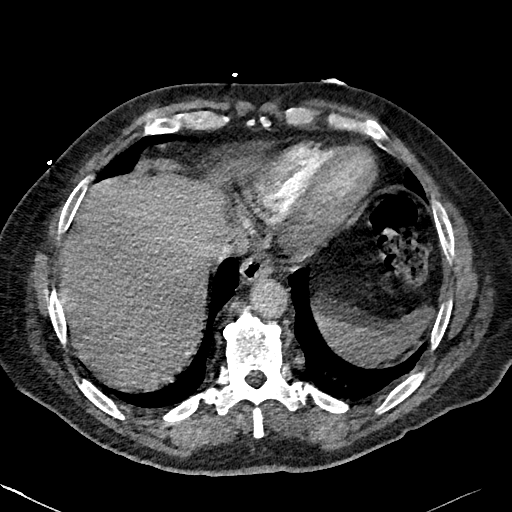
[im 82/296  lung]
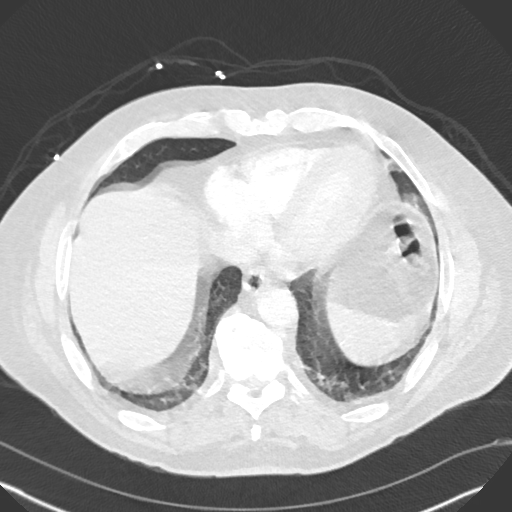
[im 99/296  mediastinal]
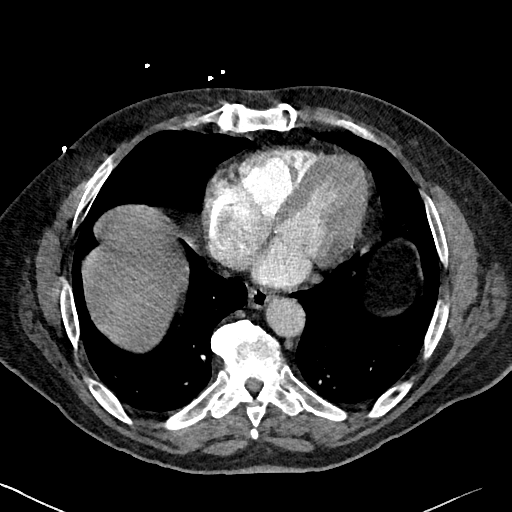
[im 115/296  lung]
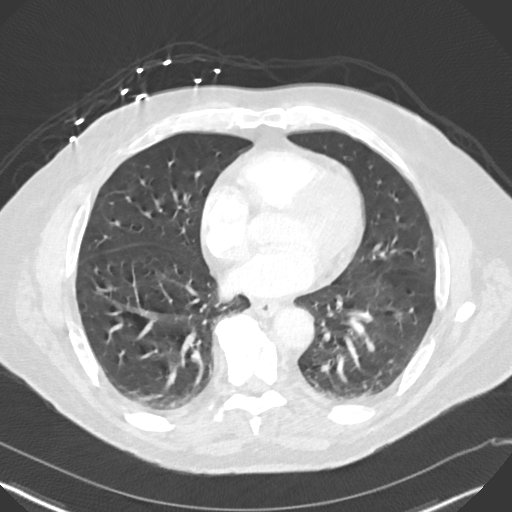
[im 132/296  mediastinal]
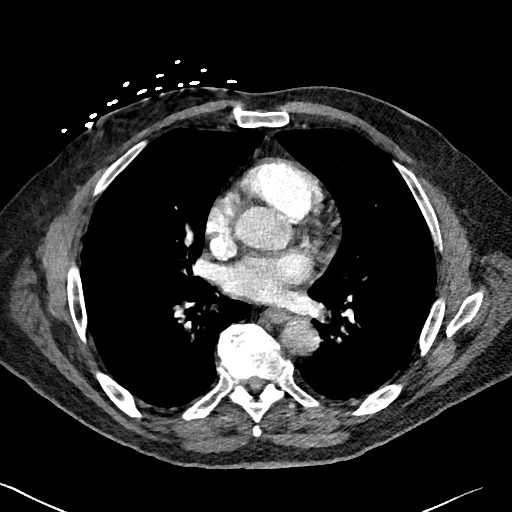
[im 148/296  lung]
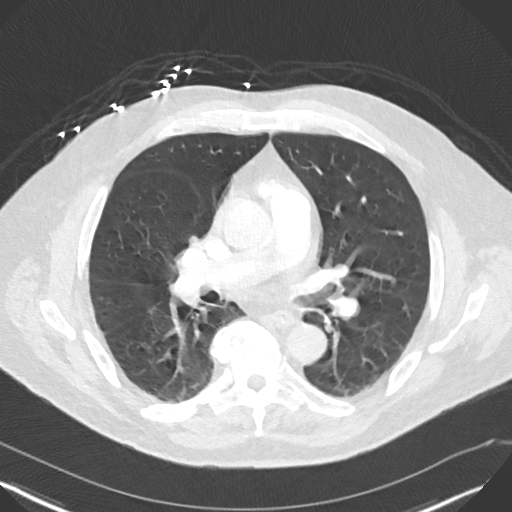
[im 164/296  mediastinal]
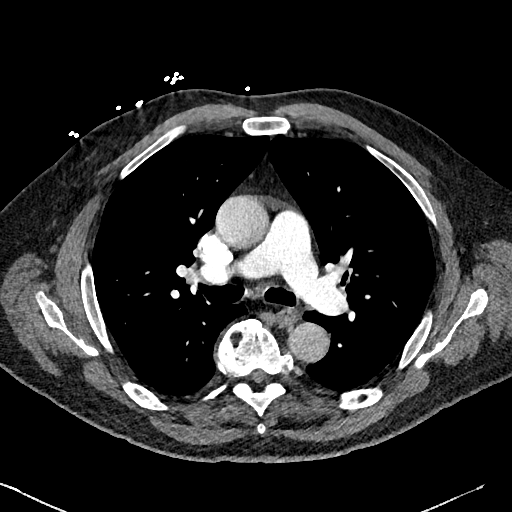
[im 181/296  lung]
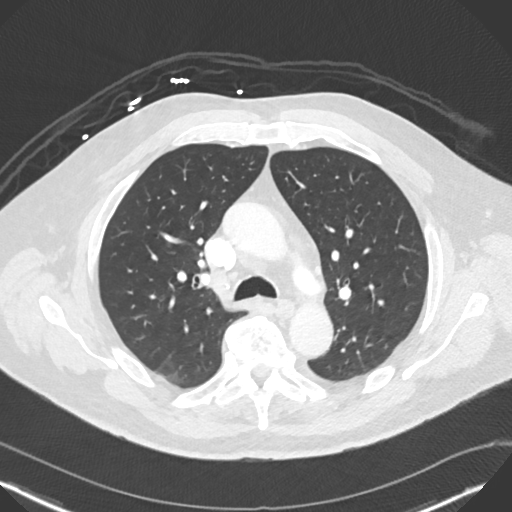
[im 197/296  mediastinal]
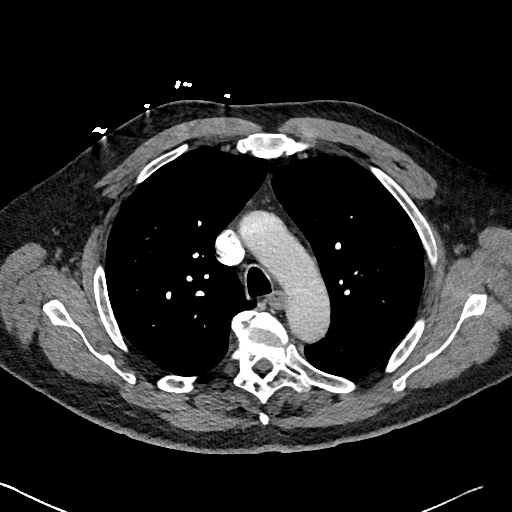
[im 214/296  lung]
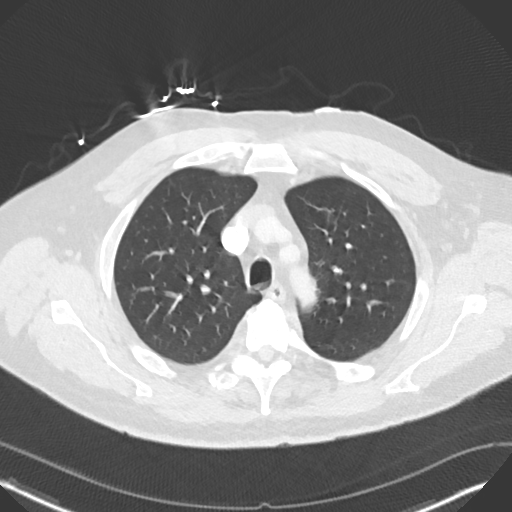
[im 230/296  mediastinal]
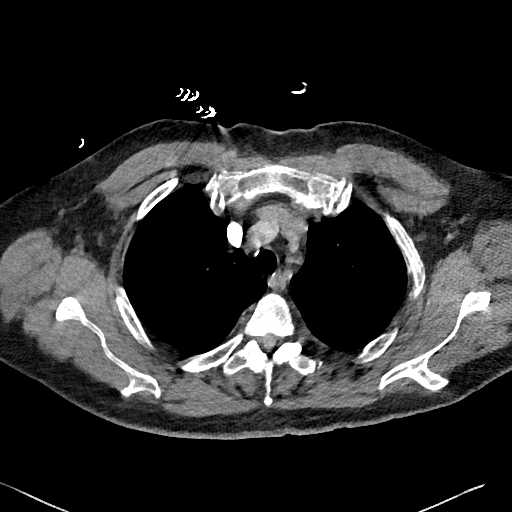
[im 246/296  lung]
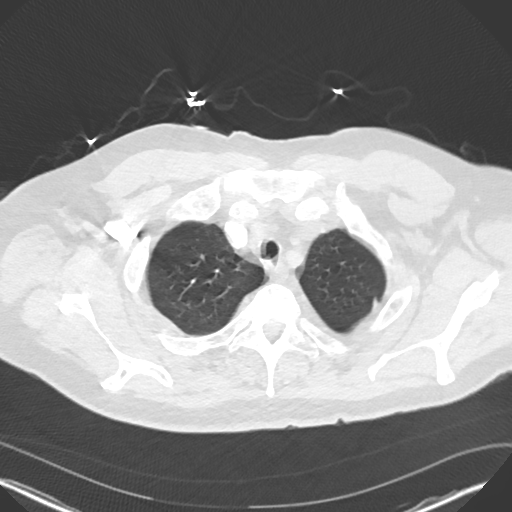
[im 263/296  mediastinal]
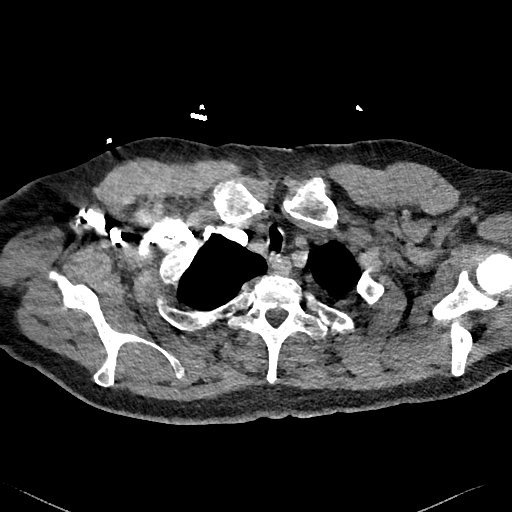
[im 279/296  lung]
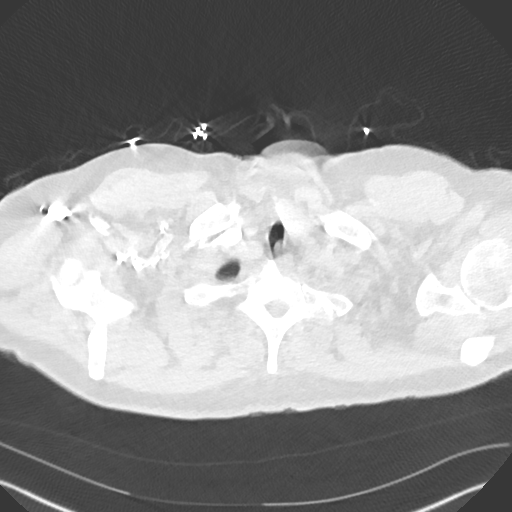

[Series 7: pe 2mm cor · coronal · 0.61mm/px · 1 of 127 slices shown]
[im 64/127  mediastinal]
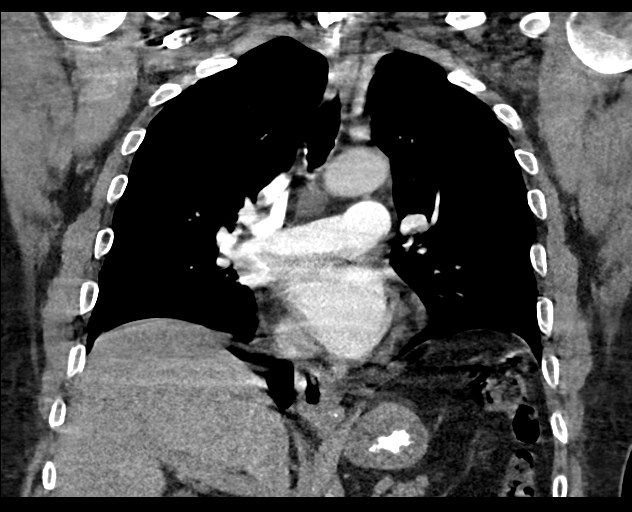

[18 of 36 positions shown; findings below may reference images not displayed]

FINDINGS: Cardiovascular: Satisfactory opacification of the pulmonary arteries
but limited by excessive respiratory motion at the bases. When
accounting for motion artifact (left upper lobe vessel marked on
[DATE] has an apparent filling defect which appears artifactual on
reformats), no identified pulmonary embolism. Normal heart size. No
pericardial effusion. Negative thoracic aorta.

Mediastinum/Nodes: No lymphadenopathy or mass.  Negative esophagus.

Lungs/Pleura: Motion degraded evaluation. Minimal atelectasis at the
bases. There is no edema, consolidation, effusion, or pneumothorax.

Upper Abdomen: No acute finding

Musculoskeletal: Confluent osteophytes with multilevel thoracic
ankylosis. Adjacent segment T10-11 disc degeneration and facet
arthropathy is advanced with bilateral foraminal stenosis. No acute
or aggressive finding.

Review of the MIP images confirms the above findings.
IMPRESSION: Respiratory motion obscures multiple segmental vessels at the bases.
No evidence of pulmonary embolism. No acute finding to explain
shortness of breath.

## 2017-03-25 IMAGING — DX DG CHEST 2V
2 series · 2 of 2 positions shown · non-contrast
Comparison: November 12, 2015

CLINICAL DATA: Shortness of breath and chest tightness

EXAM:
CHEST  2 VIEW

[w chest pa]
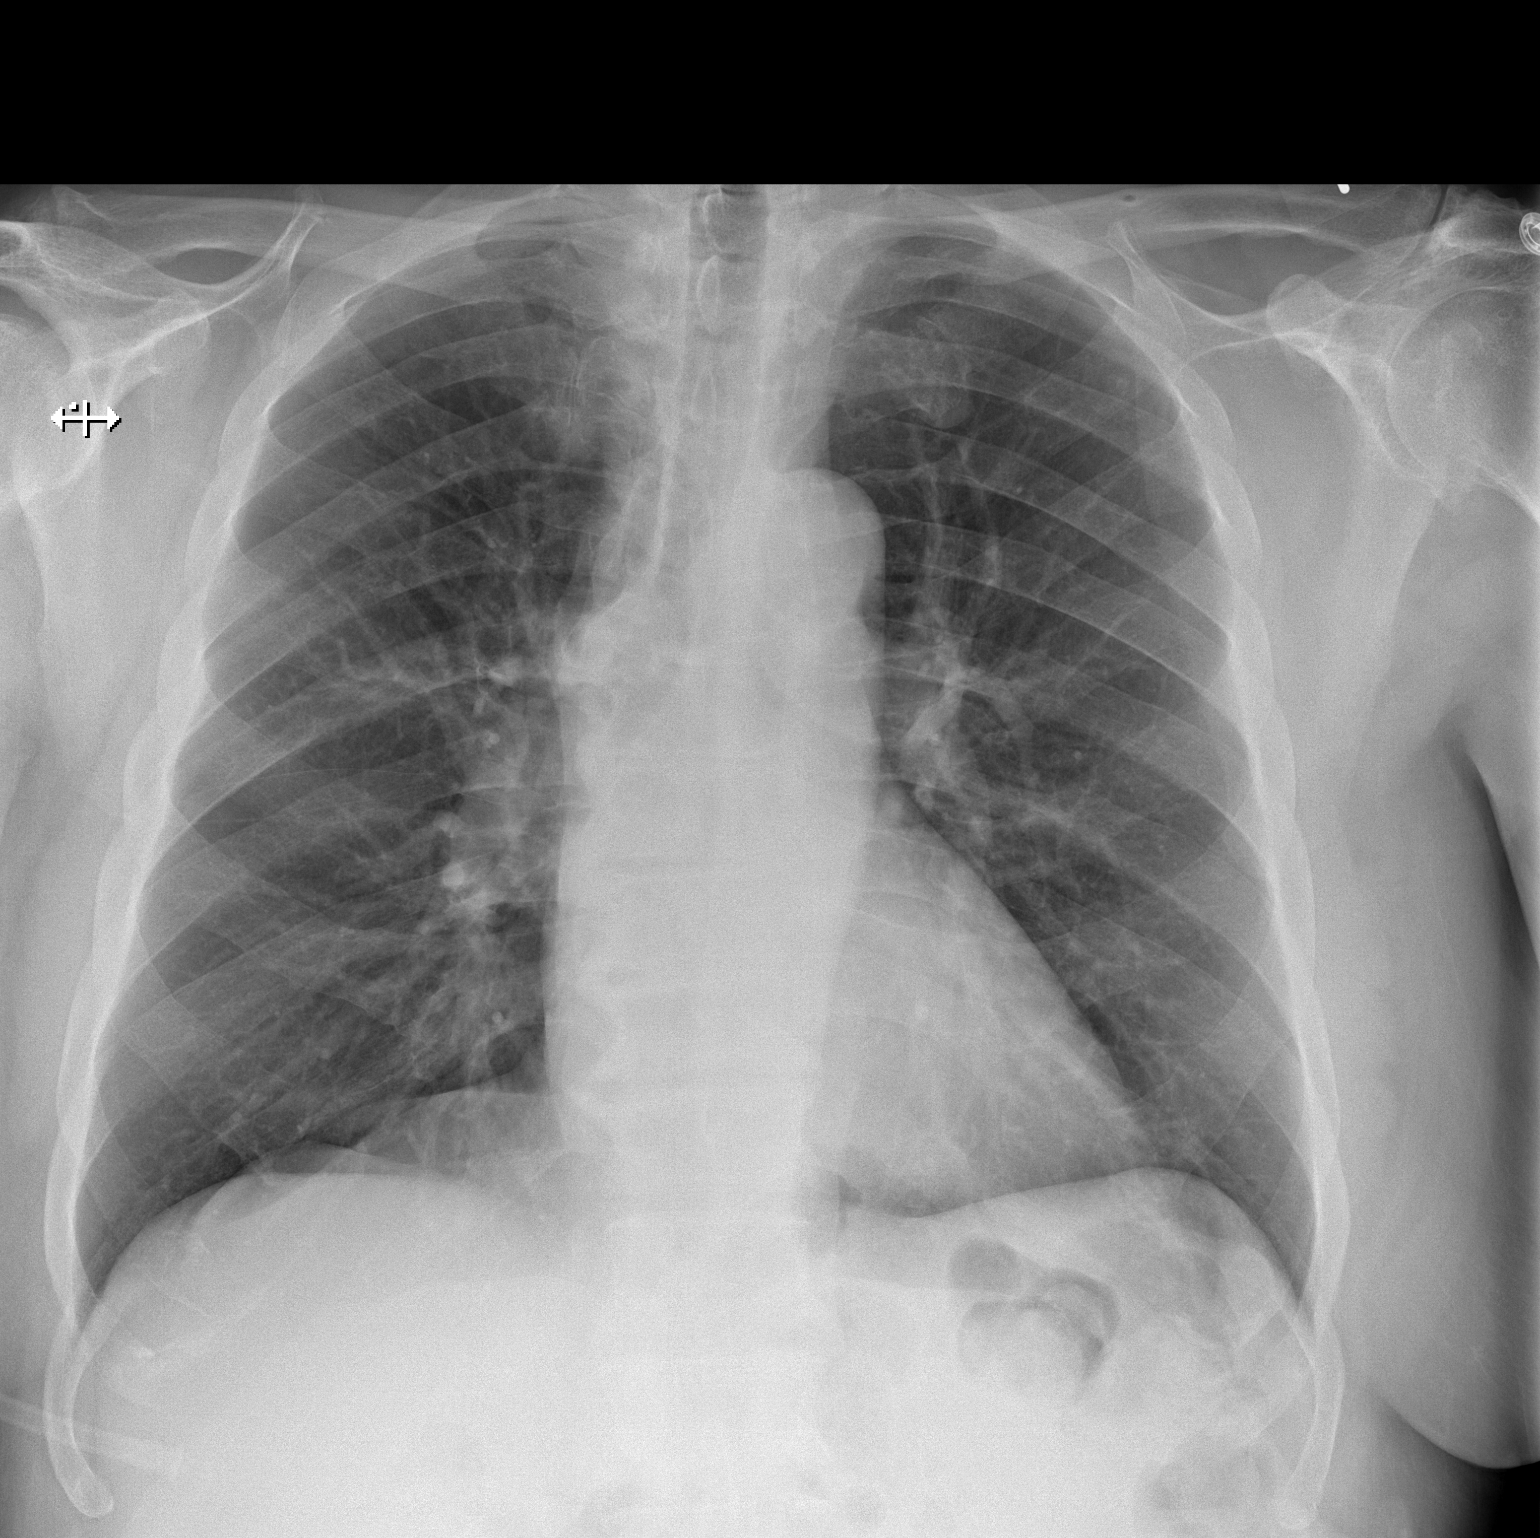

[w chest lat]
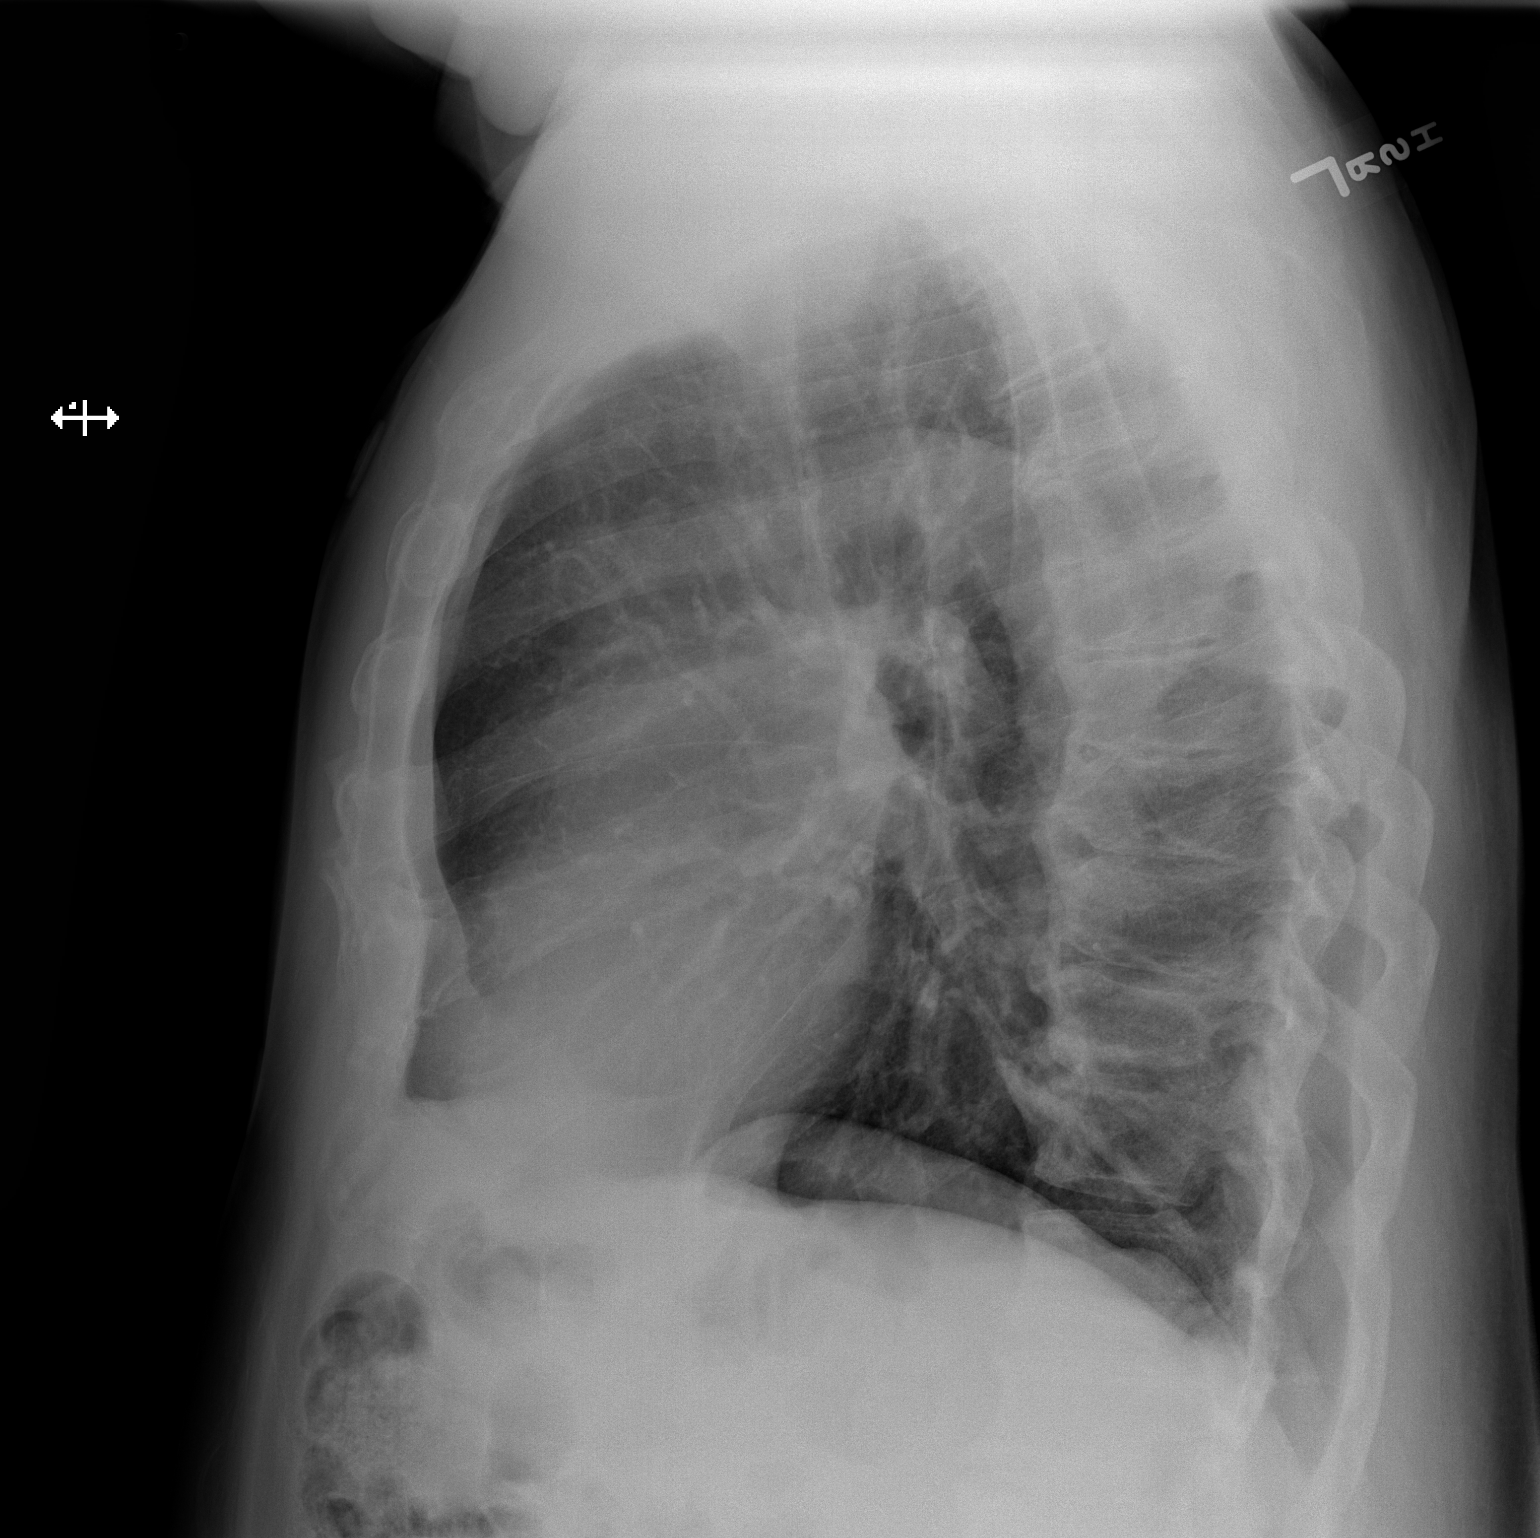

[2 of 2 positions shown; findings below may reference images not displayed]

FINDINGS: There is no edema or consolidation. The heart size and pulmonary
vascularity are normal. No adenopathy. There is degenerative change
in the thoracic spine with diffuse idiopathic skeletal hyperostosis.
There is atherosclerotic calcification in the aorta.
IMPRESSION: No edema or consolidation. Diffuse idiopathic skeletal hyperostosis.
Aortic atherosclerosis.

## 2017-05-12 DIAGNOSIS — Z6834 Body mass index (BMI) 34.0-34.9, adult: Secondary | ICD-10-CM | POA: Diagnosis not present

## 2017-05-12 DIAGNOSIS — J4530 Mild persistent asthma, uncomplicated: Secondary | ICD-10-CM | POA: Diagnosis not present

## 2017-05-12 DIAGNOSIS — I10 Essential (primary) hypertension: Secondary | ICD-10-CM | POA: Diagnosis not present

## 2017-05-12 DIAGNOSIS — E7800 Pure hypercholesterolemia, unspecified: Secondary | ICD-10-CM | POA: Diagnosis not present

## 2017-05-12 DIAGNOSIS — J309 Allergic rhinitis, unspecified: Secondary | ICD-10-CM | POA: Diagnosis not present

## 2017-05-12 DIAGNOSIS — R7303 Prediabetes: Secondary | ICD-10-CM | POA: Diagnosis not present

## 2017-11-16 DIAGNOSIS — J4530 Mild persistent asthma, uncomplicated: Secondary | ICD-10-CM | POA: Diagnosis not present

## 2017-11-16 DIAGNOSIS — Z1389 Encounter for screening for other disorder: Secondary | ICD-10-CM | POA: Diagnosis not present

## 2017-11-16 DIAGNOSIS — E7800 Pure hypercholesterolemia, unspecified: Secondary | ICD-10-CM | POA: Diagnosis not present

## 2017-11-16 DIAGNOSIS — Z6832 Body mass index (BMI) 32.0-32.9, adult: Secondary | ICD-10-CM | POA: Diagnosis not present

## 2017-11-16 DIAGNOSIS — L309 Dermatitis, unspecified: Secondary | ICD-10-CM | POA: Diagnosis not present

## 2017-11-16 DIAGNOSIS — I10 Essential (primary) hypertension: Secondary | ICD-10-CM | POA: Diagnosis not present

## 2017-11-16 DIAGNOSIS — Z0000 Encounter for general adult medical examination without abnormal findings: Secondary | ICD-10-CM | POA: Diagnosis not present

## 2018-01-14 ENCOUNTER — Encounter (HOSPITAL_COMMUNITY): Payer: Self-pay

## 2018-01-14 ENCOUNTER — Emergency Department (HOSPITAL_COMMUNITY)
Admission: EM | Admit: 2018-01-14 | Discharge: 2018-01-15 | Disposition: A | Discharge status: Home / Self Care | Payer: Medicare Other | Attending: Emergency Medicine | Admitting: Emergency Medicine

## 2018-01-14 DIAGNOSIS — K222 Esophageal obstruction: Secondary | ICD-10-CM | POA: Diagnosis not present

## 2018-01-14 DIAGNOSIS — I10 Essential (primary) hypertension: Secondary | ICD-10-CM | POA: Insufficient documentation

## 2018-01-14 DIAGNOSIS — Z79899 Other long term (current) drug therapy: Secondary | ICD-10-CM | POA: Diagnosis not present

## 2018-01-14 DIAGNOSIS — R079 Chest pain, unspecified: Secondary | ICD-10-CM | POA: Diagnosis not present

## 2018-01-14 DIAGNOSIS — J45901 Unspecified asthma with (acute) exacerbation: Secondary | ICD-10-CM | POA: Insufficient documentation

## 2018-01-14 DIAGNOSIS — R748 Abnormal levels of other serum enzymes: Secondary | ICD-10-CM

## 2018-01-14 DIAGNOSIS — T18128A Food in esophagus causing other injury, initial encounter: Secondary | ICD-10-CM | POA: Diagnosis not present

## 2018-01-14 LAB — URINALYSIS, ROUTINE W REFLEX MICROSCOPIC
BILIRUBIN URINE: NEGATIVE
Bacteria, UA: NONE SEEN
Glucose, UA: NEGATIVE mg/dL
Hgb urine dipstick: NEGATIVE
Ketones, ur: NEGATIVE mg/dL
Nitrite: NEGATIVE
Protein, ur: NEGATIVE mg/dL
SPECIFIC GRAVITY, URINE: 1.017 (ref 1.005–1.030)
pH: 6 (ref 5.0–8.0)

## 2018-01-14 LAB — COMPREHENSIVE METABOLIC PANEL
ALT: 20 U/L (ref 0–44)
AST: 22 U/L (ref 15–41)
Albumin: 4.2 g/dL (ref 3.5–5.0)
Alkaline Phosphatase: 144 U/L — ABNORMAL HIGH (ref 38–126)
Anion gap: 11 (ref 5–15)
BUN: 17 mg/dL (ref 8–23)
CALCIUM: 9.6 mg/dL (ref 8.9–10.3)
CO2: 24 mmol/L (ref 22–32)
CREATININE: 1.21 mg/dL (ref 0.61–1.24)
Chloride: 105 mmol/L (ref 98–111)
GFR calc non Af Amer: 57 mL/min — ABNORMAL LOW (ref >60)
Glucose, Bld: 93 mg/dL (ref 70–99)
Potassium: 3.5 mmol/L (ref 3.5–5.1)
Sodium: 140 mmol/L (ref 135–145)
Total Bilirubin: 0.8 mg/dL (ref 0.3–1.2)
Total Protein: 7.8 g/dL (ref 6.5–8.1)

## 2018-01-14 LAB — CBC
HCT: 41.9 % (ref 39.0–52.0)
Hemoglobin: 13.2 g/dL (ref 13.0–17.0)
MCH: 26.7 pg (ref 26.0–34.0)
MCHC: 31.5 g/dL (ref 30.0–36.0)
MCV: 84.6 fL (ref 80.0–100.0)
PLATELETS: 221 10*3/uL (ref 150–400)
RBC: 4.95 MIL/uL (ref 4.22–5.81)
RDW: 14.7 % (ref 11.5–15.5)
WBC: 7.3 10*3/uL (ref 4.0–10.5)
nRBC: 0 % (ref 0.0–0.2)

## 2018-01-14 LAB — I-STAT TROPONIN, ED: Troponin i, poc: 0.01 ng/mL (ref 0.00–0.08)

## 2018-01-14 LAB — LIPASE, BLOOD: Lipase: 24 U/L (ref 11–51)

## 2018-01-14 NOTE — ED Triage Notes (Signed)
Pt reports chest pressure today along with vomiting after eating a meal around 3pm today. Pt states he feels like something is stuck in his esophagus. Pt unable to swallow anything without immediately vomiting it back up

## 2018-01-15 MED ORDER — PANTOPRAZOLE SODIUM 40 MG PO TBEC: 40.0000 mg | DELAYED_RELEASE_TABLET | Freq: Every day | ORAL | 0 refills | Status: DC

## 2018-01-15 NOTE — Discharge Instructions (Addendum)
When you eat, take small bites and chew them thoroughly before swallowing.  Follow up with the gastroenterologist for endoscopy and probable dilatation of your esophagus.

## 2018-01-15 NOTE — ED Notes (Signed)
Patient verbalizes understanding of medications and discharge instructions. No further questions at this time. VSS and patient ambulatory at discharge.   

## 2018-01-15 NOTE — ED Notes (Signed)
Patient reports chest congestion for a few days. States he was eating earlier today and felt like something get stuck in his throat and he couldn't eat or drink after that without vomiting.   States "since I've been waiting so long I drank some water and I was able to keep it down". Denies chest pain at this time.

## 2018-01-15 NOTE — ED Provider Notes (Signed)
[facility] EMERGENCY DEPARTMENT Provider Note   CSN: [id number] Arrival date & time: 01/14/18  2044     History   Chief Complaint Chief Complaint  Patient presents with  . Chest Pain  . Emesis    HPI [name] [name] is a 78 y.o. male.  The history is provided by the patient.  He has history of asthma and hypertension, and comes in because of food being stuck in his esophagus.  He states that about 2 PM, he was eating some hamburger and chicken when it got stuck in his midesophagus.  Following that, he was not able to swallow anything, even his own saliva.  He vomited multiple times.  This has happened in the past, but usually was relieved by vomiting.  He does admit to frequent heartburn.  About 30 minutes before I saw him, he states it felt like the food passed and he has been able to swallow water since then.  Past Medical History:  Diagnosis Date  . Asthma   . Hypertension   . Seasonal allergies     Patient Active Problem List   Diagnosis Date Noted  . 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 Moderate intermittent asthma without complication 71/06/2692  . SOB (shortness of breath)   . Asthma exacerbation 01/09/2014    History reviewed. No pertinent surgical history.      Home Medications    Prior to Admission medications   Medication Sig Start Date End Date Taking? Authorizing Provider  albuterol (PROAIR HFA) 108 (90 BASE) MCG/ACT inhaler Inhale 1-2 puffs into the lungs every 4 (four) hours as needed for wheezing or shortness of breath.     [provider]  amLODipine (NORVASC) 10 MG tablet Take 10 mg by mouth daily.    [provider]  chlorproMAZINE (THORAZINE) 25 MG tablet Take 1 tablet (25 mg total) by mouth 3 (three) times daily. 03/05/16   Charlesetta Shanks, MD  hydrochlorothiazide (HYDRODIURIL) 12.5 MG tablet Take 12.5 mg by mouth daily. 11/06/15   [provider]  ipratropium-albuterol (DUONEB) 0.5-2.5 (3) MG/3ML SOLN Take 3  mLs by nebulization 2 (two) times daily as needed (tightness). 12/03/15   Parrett, Fonnie Mu, NP  pantoprazole (PROTONIX) 40 MG tablet Take 1 tablet (40 mg total) by mouth daily. 08/17/44   Delora Fuel, MD    Family History No family history on file.  Social History Social History   Tobacco Use  . Smoking status: Never Smoker  . Smokeless tobacco: Never Used  Substance Use Topics  . Alcohol use: No  . Drug use: No     Allergies   Ace inhibitors   Review of Systems Review of Systems  All other systems reviewed and are negative.    Physical Exam Updated Vital Signs BP (!) 157/90   Pulse 74   Temp 97.6 F (36.4 C) (Oral)   Resp 16   SpO2 100%   Physical Exam Vitals signs and nursing note reviewed.    78 year old male, resting comfortably and in no acute distress. Vital signs are significant for elevated blood pressure. Oxygen saturation is 100%, which is normal. Head is normocephalic and atraumatic. PERRLA, EOMI. Oropharynx is clear. Neck is nontender and supple without adenopathy or JVD. Back is nontender and there is no CVA tenderness. Lungs are clear without rales, wheezes, or rhonchi. Chest is nontender. Heart has regular rate and rhythm without murmur. Abdomen is soft, flat, nontender without masses or hepatosplenomegaly and peristalsis is normoactive. Extremities  have no cyanosis or edema, full range of motion is present. Skin is warm and dry without rash. Neurologic: Mental status is normal, cranial nerves are intact, there are no motor or sensory deficits.  ED Treatments / Results  Labs (all labs ordered are listed, but only abnormal results are displayed) Labs Reviewed  COMPREHENSIVE METABOLIC PANEL - Abnormal; Notable for the following components:      Result Value   Alkaline Phosphatase 144 (*)    GFR calc non Af Amer 57 (*)    All other components within normal limits  URINALYSIS, ROUTINE W REFLEX MICROSCOPIC - Abnormal; Notable for the following  components:   Leukocytes, UA MODERATE (*)    All other components within normal limits  LIPASE, BLOOD  CBC  I-STAT TROPONIN, ED    EKG Interpretation  Date/Time:  Friday January 15 2018 00:52:20 EST Ventricular Rate:  68 PR Interval:    QRS Duration: 94 QT Interval:  416 QTC Calculation: 443 R Axis:   51 Text Interpretation:  Sinus rhythm Prolonged PR interval When compared with ECG of 02/03/2017, HEART RATE has decreased Confirmed by Delora Fuel ([id number]) on 01/15/2018 1:20:51 AM      Procedures Procedures   Medications Ordered in ED Medications - No data to display   Initial Impression / Assessment and Plan / ED Course  I have reviewed the triage vital signs and the nursing notes.  Pertinent lab results that were available during my care of the patient were reviewed by me and considered in my medical decision making (see chart for details).  Esophageal food impaction which has resolved spontaneously.  Probable GERD based on history.  Old records are reviewed, and he has no relevant past visits.  Screening labs do show mild elevation of alkaline phosphatase.  This will need to be followed as an outpatient.  He is discharged with prescription for pantoprazole and referred to gastroenterology for outpatient endoscopy and probable esophageal dilatation.  Final Clinical Impressions(s) / ED Diagnoses   Final diagnoses:  Esophageal obstruction due to food impaction  Alkaline phosphatase elevation    ED Discharge Orders         Ordered    pantoprazole (PROTONIX) 40 MG tablet  Daily     01/15/18 3532           Delora Fuel, MD 99/24/26 0121

## 2018-05-18 DIAGNOSIS — I10 Essential (primary) hypertension: Secondary | ICD-10-CM | POA: Diagnosis not present

## 2018-05-18 DIAGNOSIS — J4530 Mild persistent asthma, uncomplicated: Secondary | ICD-10-CM | POA: Diagnosis not present

## 2018-11-30 ENCOUNTER — Ambulatory Visit
Admission: RE | Admit: 2018-11-30 | Discharge: 2018-11-30 | Disposition: A | Discharge status: Home / Self Care | Payer: Medicare Other | Source: Ambulatory Visit | Attending: Internal Medicine | Admitting: Internal Medicine

## 2018-11-30 ENCOUNTER — Other Ambulatory Visit: Payer: Self-pay | Admitting: Internal Medicine

## 2018-11-30 DIAGNOSIS — M47816 Spondylosis without myelopathy or radiculopathy, lumbar region: Secondary | ICD-10-CM

## 2018-11-30 DIAGNOSIS — M545 Low back pain: Secondary | ICD-10-CM | POA: Diagnosis not present

## 2018-11-30 DIAGNOSIS — I10 Essential (primary) hypertension: Secondary | ICD-10-CM | POA: Diagnosis not present

## 2018-11-30 DIAGNOSIS — E7800 Pure hypercholesterolemia, unspecified: Secondary | ICD-10-CM | POA: Diagnosis not present

## 2018-11-30 DIAGNOSIS — E669 Obesity, unspecified: Secondary | ICD-10-CM | POA: Diagnosis not present

## 2018-11-30 DIAGNOSIS — J4530 Mild persistent asthma, uncomplicated: Secondary | ICD-10-CM | POA: Diagnosis not present

## 2018-11-30 DIAGNOSIS — R2681 Unsteadiness on feet: Secondary | ICD-10-CM | POA: Diagnosis not present

## 2018-11-30 DIAGNOSIS — Z0000 Encounter for general adult medical examination without abnormal findings: Secondary | ICD-10-CM | POA: Diagnosis not present

## 2018-12-21 DIAGNOSIS — M545 Low back pain: Secondary | ICD-10-CM | POA: Diagnosis not present

## 2019-03-10 DIAGNOSIS — Z23 Encounter for immunization: Secondary | ICD-10-CM | POA: Diagnosis not present

## 2019-04-08 DIAGNOSIS — Z23 Encounter for immunization: Secondary | ICD-10-CM | POA: Diagnosis not present

## 2019-05-30 DIAGNOSIS — M47816 Spondylosis without myelopathy or radiculopathy, lumbar region: Secondary | ICD-10-CM | POA: Diagnosis not present

## 2019-05-30 DIAGNOSIS — I10 Essential (primary) hypertension: Secondary | ICD-10-CM | POA: Diagnosis not present

## 2019-05-30 DIAGNOSIS — J4530 Mild persistent asthma, uncomplicated: Secondary | ICD-10-CM | POA: Diagnosis not present

## 2019-05-30 DIAGNOSIS — R7303 Prediabetes: Secondary | ICD-10-CM | POA: Diagnosis not present

## 2019-10-11 DIAGNOSIS — Z23 Encounter for immunization: Secondary | ICD-10-CM | POA: Diagnosis not present

## 2019-11-17 DIAGNOSIS — Z23 Encounter for immunization: Secondary | ICD-10-CM | POA: Diagnosis not present

## 2019-12-05 DIAGNOSIS — I10 Essential (primary) hypertension: Secondary | ICD-10-CM | POA: Diagnosis not present

## 2019-12-05 DIAGNOSIS — M47816 Spondylosis without myelopathy or radiculopathy, lumbar region: Secondary | ICD-10-CM | POA: Diagnosis not present

## 2019-12-05 DIAGNOSIS — E7800 Pure hypercholesterolemia, unspecified: Secondary | ICD-10-CM | POA: Diagnosis not present

## 2019-12-05 DIAGNOSIS — J4530 Mild persistent asthma, uncomplicated: Secondary | ICD-10-CM | POA: Diagnosis not present

## 2020-06-15 DIAGNOSIS — J4530 Mild persistent asthma, uncomplicated: Secondary | ICD-10-CM | POA: Diagnosis not present

## 2020-06-15 DIAGNOSIS — M47816 Spondylosis without myelopathy or radiculopathy, lumbar region: Secondary | ICD-10-CM | POA: Diagnosis not present

## 2020-06-15 DIAGNOSIS — E7800 Pure hypercholesterolemia, unspecified: Secondary | ICD-10-CM | POA: Diagnosis not present

## 2020-06-15 DIAGNOSIS — I10 Essential (primary) hypertension: Secondary | ICD-10-CM | POA: Diagnosis not present

## 2020-11-01 DIAGNOSIS — Z23 Encounter for immunization: Secondary | ICD-10-CM | POA: Diagnosis not present

## 2020-11-01 DIAGNOSIS — R634 Abnormal weight loss: Secondary | ICD-10-CM | POA: Diagnosis not present

## 2020-11-01 DIAGNOSIS — I10 Essential (primary) hypertension: Secondary | ICD-10-CM | POA: Diagnosis not present

## 2020-11-01 DIAGNOSIS — J4530 Mild persistent asthma, uncomplicated: Secondary | ICD-10-CM | POA: Diagnosis not present

## 2021-01-18 DIAGNOSIS — R7309 Other abnormal glucose: Secondary | ICD-10-CM | POA: Diagnosis not present

## 2021-01-18 DIAGNOSIS — I10 Essential (primary) hypertension: Secondary | ICD-10-CM | POA: Diagnosis not present

## 2021-01-18 DIAGNOSIS — Z0000 Encounter for general adult medical examination without abnormal findings: Secondary | ICD-10-CM | POA: Diagnosis not present

## 2021-01-18 DIAGNOSIS — J0110 Acute frontal sinusitis, unspecified: Secondary | ICD-10-CM | POA: Diagnosis not present

## 2021-01-18 DIAGNOSIS — J4530 Mild persistent asthma, uncomplicated: Secondary | ICD-10-CM | POA: Diagnosis not present

## 2021-01-18 DIAGNOSIS — R7303 Prediabetes: Secondary | ICD-10-CM | POA: Diagnosis not present

## 2021-07-17 ENCOUNTER — Ambulatory Visit
Admission: RE | Admit: 2021-07-17 | Discharge: 2021-07-17 | Disposition: A | Discharge status: Home / Self Care | Payer: Medicare Other | Source: Ambulatory Visit | Attending: Internal Medicine | Admitting: Internal Medicine

## 2021-07-17 ENCOUNTER — Other Ambulatory Visit: Payer: Self-pay | Admitting: Internal Medicine

## 2021-07-17 DIAGNOSIS — M545 Low back pain, unspecified: Secondary | ICD-10-CM | POA: Diagnosis not present

## 2021-07-17 DIAGNOSIS — R5383 Other fatigue: Secondary | ICD-10-CM | POA: Diagnosis not present

## 2021-07-17 DIAGNOSIS — I10 Essential (primary) hypertension: Secondary | ICD-10-CM | POA: Diagnosis not present

## 2021-07-17 DIAGNOSIS — E440 Moderate protein-calorie malnutrition: Secondary | ICD-10-CM | POA: Diagnosis not present

## 2021-07-17 DIAGNOSIS — J4530 Mild persistent asthma, uncomplicated: Secondary | ICD-10-CM | POA: Diagnosis not present

## 2021-07-17 DIAGNOSIS — M47816 Spondylosis without myelopathy or radiculopathy, lumbar region: Secondary | ICD-10-CM | POA: Diagnosis not present

## 2021-07-31 ENCOUNTER — Ambulatory Visit (INDEPENDENT_AMBULATORY_CARE_PROVIDER_SITE_OTHER): Payer: Medicare Other | Admitting: Podiatry

## 2021-07-31 DIAGNOSIS — M79674 Pain in right toe(s): Secondary | ICD-10-CM | POA: Diagnosis not present

## 2021-07-31 DIAGNOSIS — B351 Tinea unguium: Secondary | ICD-10-CM | POA: Diagnosis not present

## 2021-07-31 DIAGNOSIS — M79675 Pain in left toe(s): Secondary | ICD-10-CM | POA: Diagnosis not present

## 2021-07-31 NOTE — Progress Notes (Signed)
  Subjective:  Patient ID: [name] [name], male    DOB: [birth year],  MRN: [id number]  Chief Complaint  Patient presents with   Numbness     Numbness is intermittent, Patient request nail trim   81 y.o. male returns for the above complaint.  Patient presents with thickened elongated dystrophic toenails x10 mild pain on palpation.  Patient would like to have it debrided down his not able to do it himself.  He denies any other acute complaints.  Objective:  There were no vitals filed for this visit. Podiatric Exam: Vascular: dorsalis pedis and posterior tibial pulses are palpable bilateral. Capillary return is immediate. Temperature gradient is WNL. Skin turgor WNL  Sensorium: Normal Semmes Weinstein monofilament test. Normal tactile sensation bilaterally. Nail Exam: Pt has thick disfigured discolored nails with subungual debris noted bilateral entire nail hallux through fifth toenails.  Pain on palpation to the nails. Ulcer Exam: There is no evidence of ulcer or pre-ulcerative changes or infection. Orthopedic Exam: Muscle tone and strength are WNL. No limitations in general ROM. No crepitus or effusions noted.  Skin: No Porokeratosis. No infection or ulcers    Assessment & Plan:   1. Pain due to onychomycosis of toenails of both feet     Patient was evaluated and treated and all questions answered.  Onychomycosis with pain  -Nails palliatively debrided as below. -Educated on self-care  Procedure: Nail Debridement Rationale: pain  Type of Debridement: manual, sharp debridement. Instrumentation: Nail nipper, rotary burr. Number of Nails: 10  Procedures and Treatment: Consent by patient was obtained for treatment procedures. The patient understood the discussion of treatment and procedures well. All questions were answered thoroughly reviewed. Debridement of mycotic and hypertrophic toenails, 1 through 5 bilateral and clearing of subungual debris. No ulceration, no infection noted.   Return Visit-Office Procedure: Patient instructed to return to the office for a follow up visit 3 months for continued evaluation and treatment.  Boneta Lucks, DPM    Return in about 3 months (around 10/31/2021) for Routine Foot Care.

## 2021-10-18 DIAGNOSIS — Z23 Encounter for immunization: Secondary | ICD-10-CM | POA: Diagnosis not present

## 2021-11-04 ENCOUNTER — Ambulatory Visit: Payer: Medicare Other | Admitting: Podiatry

## 2021-11-10 ENCOUNTER — Ambulatory Visit (HOSPITAL_COMMUNITY)
Admission: EM | Admit: 2021-11-10 | Discharge: 2021-11-10 | Disposition: A | Discharge status: Home / Self Care | Payer: Medicare Other | Attending: Physician Assistant | Admitting: Physician Assistant

## 2021-11-10 ENCOUNTER — Encounter (HOSPITAL_COMMUNITY): Payer: Self-pay

## 2021-11-10 DIAGNOSIS — R0602 Shortness of breath: Secondary | ICD-10-CM | POA: Insufficient documentation

## 2021-11-10 DIAGNOSIS — J069 Acute upper respiratory infection, unspecified: Secondary | ICD-10-CM | POA: Insufficient documentation

## 2021-11-10 DIAGNOSIS — Z7952 Long term (current) use of systemic steroids: Secondary | ICD-10-CM | POA: Diagnosis not present

## 2021-11-10 DIAGNOSIS — Z1152 Encounter for screening for COVID-19: Secondary | ICD-10-CM | POA: Diagnosis not present

## 2021-11-10 LAB — RESP PANEL BY RT-PCR (RSV, FLU A&B, COVID)  RVPGX2
Influenza A by PCR: NEGATIVE
Influenza B by PCR: NEGATIVE
Resp Syncytial Virus by PCR: NEGATIVE
SARS Coronavirus 2 by RT PCR: NEGATIVE

## 2021-11-10 MED ORDER — PREDNISONE 50 MG PO TABS: ORAL_TABLET | ORAL | 0 refills | Status: DC

## 2021-11-10 NOTE — ED Triage Notes (Signed)
Patient having hoarseness, SOB (using nebulizer), dry cough. Patient was sick before his wife. No known sick exposure.  Symptoms onset 3 days ago.

## 2021-11-10 NOTE — ED Provider Notes (Signed)
[facility]    CSN: [id number] Arrival date & time: 11/10/21  1004      History   Chief Complaint Chief Complaint  Patient presents with   URI   Shortness of Breath    HPI ODES [name] [name] is a 81 y.o. male.   The history is provided by the patient. No language interpreter was used.  URI Presenting symptoms: cough   Cough:    Cough characteristics:  Non-productive   Onset quality:  Gradual   Duration:  3 days   Progression:  Worsening   Chronicity:  New Severity:  Moderate Progression:  Worsening Chronicity:  New Relieved by:  Nothing Worsened by:  Nothing Risk factors: sick contacts   Risk factors: no recent illness   Shortness of Breath Associated symptoms: cough     Past Medical History:  Diagnosis Date   Asthma    Hypertension    Seasonal allergies     Patient Active Problem List   Diagnosis Date Noted   Nasal polyps 03/07/2014   Moderate intermittent asthma without complication 62/22/9798   SOB (shortness of breath)    Asthma exacerbation 01/09/2014    History reviewed. No pertinent surgical history.     Home Medications    Prior to Admission medications   Medication Sig Start Date End Date Taking? Authorizing Provider  albuterol (PROAIR HFA) 108 (90 BASE) MCG/ACT inhaler Inhale 1-2 puffs into the lungs every 4 (four) hours as needed for wheezing or shortness of breath.    Yes [provider]  amLODipine (NORVASC) 10 MG tablet Take 10 mg by mouth daily.   Yes [provider]  amLODipine (NORVASC) 10 MG tablet take 1 tablet by mouth every day for blood pressure   Yes [provider]  chlorproMAZINE (THORAZINE) 25 MG tablet Take 1 tablet (25 mg total) by mouth 3 (three) times daily. 03/05/16  Yes Charlesetta Shanks, MD  fluticasone [facility] ALLERGY RELIEF) 50 MCG/ACT nasal spray 1 spray in each nostril   Yes [provider]  fluticasone (FLOVENT HFA) 220 MCG/ACT inhaler 1 puff   Yes [provider]  hydrochlorothiazide (HYDRODIURIL) 12.5 MG tablet Take 12.5 mg by mouth daily. 11/06/15  Yes [provider]  ipratropium-albuterol (DUONEB) 0.5-2.5 (3) MG/3ML SOLN Take 3 mLs by nebulization 2 (two) times daily as needed (tightness). 12/03/15  Yes Parrett, Tammy S, NP  pantoprazole (PROTONIX) 40 MG tablet Take 1 tablet (40 mg total) by mouth daily. 09/14/09  Yes Delora Fuel, MD    Family History History reviewed. No pertinent family history.  Social History Social History   Tobacco Use   Smoking status: Never   Smokeless tobacco: Never  Substance Use Topics   Alcohol use: No   Drug use: No     Allergies   Ace inhibitors and Azelastine hcl   Review of Systems Review of Systems  Respiratory:  Positive for cough and shortness of breath.   All other systems reviewed and are negative.    Physical Exam Triage Vital Signs ED Triage Vitals  Enc Vitals Group     BP 11/10/21 1038 120/64     Pulse Rate 11/10/21 1038 94     Resp 11/10/21 1038 16     Temp 11/10/21 1038 98.3 F (36.8 C)     Temp Source 11/10/21 1038 Oral     SpO2 11/10/21 1038 97 %     Weight --      Height --  Head Circumference --      Peak Flow --      Pain Score 11/10/21 1037 0     Pain Loc --      Pain Edu? --      Excl. in [place]? --    No data found.  Updated Vital Signs BP 120/64 (BP Location: Left Arm)   Pulse 94   Temp 98.3 F (36.8 C) (Oral)   Resp 16   SpO2 97%   Visual Acuity Right Eye Distance:   Left Eye Distance:   Bilateral Distance:    Right Eye Near:   Left Eye Near:    Bilateral Near:     Physical Exam Vitals reviewed.  Constitutional:      Appearance: He is well-developed.  Cardiovascular:     Rate and Rhythm: Regular rhythm.  Pulmonary:     Breath sounds: Normal breath sounds. No decreased breath sounds.  Chest:     Chest wall: No tenderness.  Musculoskeletal:     Cervical back: Normal range of motion.  Skin:    General: Skin is warm.   Neurological:     General: No focal deficit present.     Mental Status: He is alert.  Psychiatric:        Mood and Affect: Mood normal.      UC Treatments / Results  Labs (all labs ordered are listed, but only abnormal results are displayed) Labs Reviewed  RESP PANEL BY RT-PCR (RSV, FLU A&B, COVID)  [id number]    EKG   Radiology No results found.  Procedures Procedures (including critical care time)  Medications Ordered in UC Medications - No data to display  Initial Impression / Assessment and Plan / UC Course  I have reviewed the triage vital signs and the nursing notes.  Pertinent labs & imaging results that were available during my care of the patient were reviewed by me and considered in my medical decision making (see chart for details).      Final Clinical Impressions(s) / UC Diagnoses   Final diagnoses:  Upper respiratory tract infection, unspecified type     Discharge Instructions      Return if any problems.    ED Prescriptions     Medication Sig Dispense Auth. Provider   predniSONE (DELTASONE) 50 MG tablet One tablet a day 6 tablet Fransico Meadow, [place]      PDMP not reviewed this encounter. An After Visit Summary was printed and given to the patient.    Fransico Meadow, [place] 11/10/21 1110

## 2021-11-10 NOTE — Discharge Instructions (Signed)
Return if any problems.

## 2021-11-11 ENCOUNTER — Ambulatory Visit (INDEPENDENT_AMBULATORY_CARE_PROVIDER_SITE_OTHER): Payer: Medicare Other | Admitting: Podiatry

## 2021-11-11 ENCOUNTER — Encounter: Payer: Self-pay | Admitting: Podiatry

## 2021-11-11 DIAGNOSIS — B351 Tinea unguium: Secondary | ICD-10-CM

## 2021-11-11 DIAGNOSIS — M79674 Pain in right toe(s): Secondary | ICD-10-CM

## 2021-11-11 DIAGNOSIS — M79675 Pain in left toe(s): Secondary | ICD-10-CM

## 2021-11-11 DIAGNOSIS — M67472 Ganglion, left ankle and foot: Secondary | ICD-10-CM | POA: Diagnosis not present

## 2021-11-11 NOTE — Progress Notes (Unsigned)
  Subjective:  Patient ID: [name] [name], male    DOB: February 12, 1940,  MRN: [id number]  [name] [name] presents to clinic today for painful, elongated mycotic toenails. Chief Complaint  Patient presents with   Nail Problem    Nail Trim  Not Diabetic  PCP - Dr Vertis Kelch , last OV June 2023    Foot Problem    Pt states he has a small bump on 3rd toe on left foot , he says it has been there for a couple weeks , no pain , no bleeding or drainage    PCP is Leeroy Cha, MD.  Allergies  Allergen Reactions   Ace Inhibitors Swelling    In the face   Azelastine Hcl Swelling    Review of Systems: Negative except as noted in the HPI.  Objective: No changes noted in today's physical examination.  [name] [name] is a pleasant 81 y.o. male WD, WN in NAD. AAO x 3.  Vascular Examination: Capillary refill time immediate b/l.Vascular status intact b/l with palpable pedal pulses. Pedal hair sparse b/l. No edema. No pain with calf compression b/l. Skin temperature gradient WNL b/l.   Neurological Examination: Sensation grossly intact b/l with 10 gram monofilament. Vibratory sensation intact b/l.   Dermatological Examination: Pedal skin with normal turgor, texture and tone b/l. Toenails 1-5 b/l thick, discolored, elongated with subungual debris and pain on dorsal palpation.  Patient has small firm, fixed, STM clinically resembling ganglion cyst at PIPJ. Non-pulsatile and not painful. No erythema, no edema, no fluctuance.   Musculoskeletal Examination: Muscle strength 5/5 to all lower extremity muscle groups bilaterally. Hammertoe deformity noted 2-5 b/l. Utilizes cane for ambulation assistance.  Radiographs: None  Assessment/Plan: 1. Pain due to onychomycosis of toenails of both feet   2. Ganglion cyst of left foot     No orders of the defined types were placed in this encounter.   -Patient was evaluated and treated. All patient's and/or POA's questions/concerns answered on  today's visit. -Discussed ganglion cyst findings. Currently asymptomatic. He is to see Dr. Posey Pronto should lesion get larger or preset problems when wearing shoegear. -Patient to continue soft, supportive shoe gear daily. -Mycotic toenails 1-5 bilaterally were debrided in length and girth with sterile nail nippers and dremel without incident. -Shoe recommendations given for [place]. -Patient/POA to call should there be question/concern in the interim.   Return in about 5 months (around 04/12/2022) per patient request.  Marzetta Board, DPM

## 2021-12-18 ENCOUNTER — Emergency Department (HOSPITAL_COMMUNITY): Payer: Medicare Other

## 2021-12-18 ENCOUNTER — Other Ambulatory Visit: Payer: Self-pay

## 2021-12-18 ENCOUNTER — Encounter (HOSPITAL_COMMUNITY): Payer: Self-pay

## 2021-12-18 ENCOUNTER — Emergency Department (HOSPITAL_COMMUNITY)
Admission: EM | Admit: 2021-12-18 | Discharge: 2021-12-18 | Disposition: A | Discharge status: Home / Self Care | Payer: Medicare Other | Attending: Emergency Medicine | Admitting: Emergency Medicine

## 2021-12-18 DIAGNOSIS — E876 Hypokalemia: Secondary | ICD-10-CM | POA: Insufficient documentation

## 2021-12-18 DIAGNOSIS — J45909 Unspecified asthma, uncomplicated: Secondary | ICD-10-CM | POA: Insufficient documentation

## 2021-12-18 DIAGNOSIS — I10 Essential (primary) hypertension: Secondary | ICD-10-CM | POA: Diagnosis not present

## 2021-12-18 DIAGNOSIS — R7309 Other abnormal glucose: Secondary | ICD-10-CM

## 2021-12-18 DIAGNOSIS — Z79899 Other long term (current) drug therapy: Secondary | ICD-10-CM | POA: Diagnosis not present

## 2021-12-18 DIAGNOSIS — Z1152 Encounter for screening for COVID-19: Secondary | ICD-10-CM | POA: Insufficient documentation

## 2021-12-18 DIAGNOSIS — R059 Cough, unspecified: Secondary | ICD-10-CM | POA: Diagnosis not present

## 2021-12-18 DIAGNOSIS — D649 Anemia, unspecified: Secondary | ICD-10-CM | POA: Diagnosis not present

## 2021-12-18 DIAGNOSIS — J441 Chronic obstructive pulmonary disease with (acute) exacerbation: Secondary | ICD-10-CM | POA: Insufficient documentation

## 2021-12-18 DIAGNOSIS — R739 Hyperglycemia, unspecified: Secondary | ICD-10-CM | POA: Diagnosis not present

## 2021-12-18 DIAGNOSIS — Z7951 Long term (current) use of inhaled steroids: Secondary | ICD-10-CM | POA: Insufficient documentation

## 2021-12-18 DIAGNOSIS — R0602 Shortness of breath: Secondary | ICD-10-CM | POA: Diagnosis not present

## 2021-12-18 LAB — CBC WITH DIFFERENTIAL/PLATELET
Abs Immature Granulocytes: 0.02 10*3/uL (ref 0.00–0.07)
Basophils Absolute: 0.1 10*3/uL (ref 0.0–0.1)
Basophils Relative: 1 %
Eosinophils Absolute: 0.8 10*3/uL — ABNORMAL HIGH (ref 0.0–0.5)
Eosinophils Relative: 11 %
HCT: 37.2 % — ABNORMAL LOW (ref 39.0–52.0)
Hemoglobin: 11.8 g/dL — ABNORMAL LOW (ref 13.0–17.0)
Immature Granulocytes: 0 %
Lymphocytes Relative: 32 %
Lymphs Abs: 2.3 10*3/uL (ref 0.7–4.0)
MCH: 28 pg (ref 26.0–34.0)
MCHC: 31.7 g/dL (ref 30.0–36.0)
MCV: 88.4 fL (ref 80.0–100.0)
Monocytes Absolute: 0.6 10*3/uL (ref 0.1–1.0)
Monocytes Relative: 8 %
Neutro Abs: 3.6 10*3/uL (ref 1.7–7.7)
Neutrophils Relative %: 48 %
Platelets: 232 10*3/uL (ref 150–400)
RBC: 4.21 MIL/uL — ABNORMAL LOW (ref 4.22–5.81)
RDW: 14.9 % (ref 11.5–15.5)
WBC: 7.4 10*3/uL (ref 4.0–10.5)
nRBC: 0 % (ref 0.0–0.2)

## 2021-12-18 LAB — I-STAT ARTERIAL BLOOD GAS, ED
Acid-Base Excess: 2 mmol/L (ref 0.0–2.0)
Bicarbonate: 26.7 mmol/L (ref 20.0–28.0)
Calcium, Ion: 1.22 mmol/L (ref 1.15–1.40)
HCT: 36 % — ABNORMAL LOW (ref 39.0–52.0)
Hemoglobin: 12.2 g/dL — ABNORMAL LOW (ref 13.0–17.0)
O2 Saturation: 92 %
Patient temperature: 98.6
Potassium: 3 mmol/L — ABNORMAL LOW (ref 3.5–5.1)
Sodium: 140 mmol/L (ref 135–145)
TCO2: 28 mmol/L (ref 22–32)
pCO2 arterial: 43.2 mmHg (ref 32–48)
pH, Arterial: 7.399 (ref 7.35–7.45)
pO2, Arterial: 64 mmHg — ABNORMAL LOW (ref 83–108)

## 2021-12-18 LAB — BASIC METABOLIC PANEL
Anion gap: 15 (ref 5–15)
BUN: 18 mg/dL (ref 8–23)
CO2: 26 mmol/L (ref 22–32)
Calcium: 9.9 mg/dL (ref 8.9–10.3)
Chloride: 100 mmol/L (ref 98–111)
Creatinine, Ser: 1.02 mg/dL (ref 0.61–1.24)
GFR, Estimated: >60 mL/min (ref >60)
Glucose, Bld: 126 mg/dL — ABNORMAL HIGH (ref 70–99)
Potassium: 3.1 mmol/L — ABNORMAL LOW (ref 3.5–5.1)
Sodium: 141 mmol/L (ref 135–145)

## 2021-12-18 LAB — RESP PANEL BY RT-PCR (FLU A&B, COVID) ARPGX2
Influenza A by PCR: NEGATIVE
Influenza B by PCR: NEGATIVE
SARS Coronavirus 2 by RT PCR: NEGATIVE

## 2021-12-18 MED ORDER — IPRATROPIUM-ALBUTEROL 0.5-2.5 (3) MG/3ML IN SOLN
3.0000 mL | Freq: Once | RESPIRATORY_TRACT | Status: AC
  Administered 2021-12-18: 3 mL via RESPIRATORY_TRACT
  Filled 2021-12-18: qty 3

## 2021-12-18 MED ORDER — POTASSIUM CHLORIDE CRYS ER 20 MEQ PO TBCR: EXTENDED_RELEASE_TABLET | ORAL | 0 refills | Status: DC

## 2021-12-18 MED ORDER — PREDNISONE 50 MG PO TABS: 50.0000 mg | ORAL_TABLET | Freq: Every day | ORAL | 0 refills | Status: DC

## 2021-12-18 MED ORDER — POTASSIUM CHLORIDE CRYS ER 20 MEQ PO TBCR
40.0000 meq | EXTENDED_RELEASE_TABLET | Freq: Once | ORAL | Status: AC
  Administered 2021-12-18: 40 meq via ORAL
  Filled 2021-12-18: qty 2

## 2021-12-18 NOTE — ED Triage Notes (Signed)
Pt BIB EMS from home. Pt has hx of asthma and reports productive cough x3 days. Pt reports worsening SOB, used 3 albuterol tx at home with no relief. EMS arrived pt sats 88% room air. Initially pt with inspiratory and expiratory wheezes in all fields. EMS gave 2 duo nebs, 125 solumedrol, and 2g mag. Reports left upper lobe clear, all other lobes still with wheezing  Pt tachycardic in the 120s BP 130/66

## 2021-12-18 NOTE — ED Notes (Signed)
Patient verbalizes understanding of discharge instructions. Opportunity for questioning and answers were provided. Armband removed by staff, pt discharged from ED via wheelchair.  

## 2021-12-18 NOTE — Discharge Instructions (Addendum)
Continue using your home nebulizer as needed.  Return to the emergency department if you have difficulty breathing which is not responding to your home nebulizer.

## 2021-12-18 NOTE — ED Provider Notes (Signed)
[facility] EMERGENCY DEPARTMENT Provider Note   CSN: [id number] Arrival date & time: 12/18/21  0124     History  Chief Complaint  Patient presents with   Respiratory Distress    [name] [name] is a 81 y.o. male.  The history is provided by the patient and the EMS personnel.  He has history of hypertension, asthma and comes in because of shortness of breath which started tonight.  He denies fever or chills but has had sweats.  He denies arthralgias or myalgias.  He does describe some tightness in his chest.  He tried using his home nebulizer tonight without any benefit.  He actually told EMS that he had been coughing for 3 days.  Cough is productive of some sputum, but he has not looked at the color.  EMS noted initial oxygen saturation of 88% on room air with inspiratory and expiratory wheezes in all lung fields.  He received albuterol and ipratropium x 2, methylprednisolone 125 mg, and magnesium 2 g with significant improvement.   Home Medications Prior to Admission medications   Medication Sig Start Date End Date Taking? Authorizing Provider  albuterol (PROAIR HFA) 108 (90 BASE) MCG/ACT inhaler Inhale 1-2 puffs into the lungs every 4 (four) hours as needed for wheezing or shortness of breath.     [provider]  amLODipine (NORVASC) 10 MG tablet Take 10 mg by mouth daily.    [provider]  amLODipine (NORVASC) 10 MG tablet take 1 tablet by mouth every day for blood pressure    [provider]  chlorproMAZINE (THORAZINE) 25 MG tablet Take 1 tablet (25 mg total) by mouth 3 (three) times daily. 03/05/16   Charlesetta Shanks, MD  fluticasone [facility] ALLERGY RELIEF) 50 MCG/ACT nasal spray 1 spray in each nostril    [provider]  fluticasone (FLOVENT HFA) 220 MCG/ACT inhaler 1 puff    [provider]  hydrochlorothiazide (HYDRODIURIL) 12.5 MG tablet Take 12.5 mg by mouth daily. 11/06/15   [provider]   ipratropium-albuterol (DUONEB) 0.5-2.5 (3) MG/3ML SOLN Take 3 mLs by nebulization 2 (two) times daily as needed (tightness). 12/03/15   Parrett, Fonnie Mu, NP  pantoprazole (PROTONIX) 40 MG tablet Take 1 tablet (40 mg total) by mouth daily. 05/18/71   Delora Fuel, MD  predniSONE (DELTASONE) 50 MG tablet One tablet a day 11/10/21   Fransico Meadow, PA-C      Allergies    Ace inhibitors and Azelastine hcl    Review of Systems   Review of Systems  All other systems reviewed and are negative.   Physical Exam Updated Vital Signs BP (!) 141/75   Pulse (!) 125   Temp (!) 97.4 F (36.3 C) (Oral)   Resp 18   Ht '5\' 11"'$  (1.803 m)   Wt 90.7 kg   SpO2 99%   BMI 27.89 kg/m  Physical Exam Vitals and nursing note reviewed.   81 year old male in mild respiratory distress. Vital signs are significant for borderline elevated blood pressure and elevated heart rate. Oxygen saturation is 99%, which is normal.  There is slight use of accessory muscles of respiration. Head is normocephalic and atraumatic. PERRLA, EOMI. Oropharynx is clear. Neck is nontender and supple without adenopathy or JVD. Back is nontender and there is no CVA tenderness. Lungs have diminished airflow with prolonged exhalation phase and rather diffuse inspiratory and expiratory wheezes which are more prominent in the lower lung fields. Chest is nontender. Heart has  regular rate and rhythm without murmur. Abdomen is soft, flat, nontender. Extremities have no cyanosis or edema, full range of motion is present. Skin is warm and dry without rash. Neurologic: Mental status is normal, cranial nerves are intact, moves all extremities equally.  ED Results / Procedures / Treatments   Labs (all labs ordered are listed, but only abnormal results are displayed) Labs Reviewed  BASIC METABOLIC PANEL - Abnormal; Notable for the following components:      Result Value   Potassium 3.1 (*)    Glucose, Bld 126 (*)    All other components  within normal limits  CBC WITH DIFFERENTIAL/PLATELET - Abnormal; Notable for the following components:   RBC 4.21 (*)    Hemoglobin 11.8 (*)    HCT 37.2 (*)    Eosinophils Absolute 0.8 (*)    All other components within normal limits  I-STAT ARTERIAL BLOOD GAS, ED - Abnormal; Notable for the following components:   pO2, Arterial 64 (*)    Potassium 3.0 (*)    HCT 36.0 (*)    Hemoglobin 12.2 (*)    All other components within normal limits  RESP PANEL BY RT-PCR (FLU A&B, COVID) [id number]    EKG EKG Interpretation  Date/Time:  Wednesday December 18 2021 01:29:41 EST Ventricular Rate:  127 PR Interval:  204 QRS Duration: 98 QT Interval:  378 QTC Calculation: 548 R Axis:   62 Text Interpretation: Sinus tachycardia Minimal ST depression, inferior leads Prolonged QT interval When compared with ECG of 01/15/2018, HEART RATE has increased ST abnmormality is now present Confirmed by Delora Fuel ([id number]) on 12/18/2021 1:39:11 AM  Radiology DG Chest Port 1 View  Result Date: 12/18/2021 CLINICAL DATA:  Shortness of breath. EXAM: PORTABLE CHEST 1 VIEW COMPARISON:  02/04/2016. FINDINGS: The heart size and mediastinal contours are within normal limits. Pulmonary vasculature is mildly distended. No consolidation, effusion, or pneumothorax. Degenerative changes are present in the thoracic spine. IMPRESSION: Mildly distended pulmonary vasculature. Electronically Signed   By: Brett Fairy M.D.   On: 12/18/2021 02:18    Procedures Procedures  Cardiac monitor shows sinus tachycardia, per my interpretation.   Medications Ordered in ED Medications  ipratropium-albuterol (DUONEB) 0.5-2.5 (3) MG/3ML nebulizer solution 3 mL (3 mLs Nebulization Given 12/18/21 0209)  potassium chloride SA (KLOR-CON M) CR tablet 40 mEq (40 mEq Oral Given 12/18/21 9379)    ED Course/ Medical Decision Making/ A&P                           Medical Decision Making Amount and/or Complexity of Data Reviewed Labs:  ordered. Radiology: ordered.  Risk Prescription drug management.   Respiratory distress with evidence of bronchospasm, possible asthma, COPD exacerbation.  Consider pneumonia, viral infection such as influenza or COVID-19 or RSV.   I reviewed and interpreted his electrocardiogram, and my interpretation is sinus tachycardia with prolonged QT and minimal ST depression in the inferior leads which is new compared with prior and possibly rate related.  I have ordered chest x-ray, CBC, basic metabolic panel, arterial blood gases, respiratory pathogen panel.  I have reviewed his old records, and on 01/09/2014 he had respiratory acidosis with pCO2 of 77 and pH of 7.18.  I have ordered the blood gas to make sure that he is not having CO2 retention and respiratory acidosis today.  I have ordered an additional nebulizer treatment with albuterol and ipratropium.  Chest x-ray shows no evidence of pneumonia.  I have  independently viewed the image, and agree with radiologist's interpretation.  I have reviewed and interpreted laboratory tests, and my interpretation is no evidence of CO2 retention and respiratory acidosis, hypokalemia likely secondary to diuretic use, mild elevation of random glucose, mild anemia which is new compared with January 14, 2018 but had been present prior to that.  Following above-noted treatment, patient was reevaluated and he was no longer using accessory muscles of respiration and states that he felt that he was back at his baseline.  On reexam, lungs are clear with good airflow.  Respiratory pathogen panel shows no evidence of COVID-19 or influenza.  Since he is back at his baseline, I feel he is safe for discharge.  I am discharging him with a prescription for prednisone for 5 days.  Because of hypokalemia, I have ordered oral potassium in the emergency department and sending him home with a prescription for oral potassium.  I have recommended follow-up with primary care provider in 5 days  to decide whether he will need to have a steroid taper.  CRITICAL CARE Performed by: Delora Fuel Total critical care time: 40 minutes Critical care time was exclusive of separately billable procedures and treating other patients. Critical care was necessary to treat or prevent imminent or life-threatening deterioration. Critical care was time spent personally by me on the following activities: development of treatment plan with patient and/or surrogate as well as nursing, discussions with consultants, evaluation of patient's response to treatment, examination of patient, obtaining history from patient or surrogate, ordering and performing treatments and interventions, ordering and review of laboratory studies, ordering and review of radiographic studies, pulse oximetry and re-evaluation of patient's condition.  Final Clinical Impression(s) / ED Diagnoses Final diagnoses:  COPD exacerbation ([place])  Hypokalemia  Normochromic normocytic anemia  Elevated random blood glucose level    Rx / DC Orders ED Discharge Orders          Ordered    predniSONE (DELTASONE) 50 MG tablet  Daily        12/18/21 0442    potassium chloride SA (KLOR-CON M) 20 MEQ tablet        12/18/21 3716              Delora Fuel, MD 96/78/93 [phone]

## 2022-01-05 ENCOUNTER — Other Ambulatory Visit: Payer: Self-pay

## 2022-01-05 ENCOUNTER — Encounter (HOSPITAL_COMMUNITY): Payer: Self-pay | Admitting: Emergency Medicine

## 2022-01-05 ENCOUNTER — Observation Stay (HOSPITAL_COMMUNITY)
Admission: EM | Admit: 2022-01-05 | Discharge: 2022-01-06 | Disposition: A | Discharge status: Home / Self Care | Payer: Medicare Other | Attending: Osteopathic Medicine | Admitting: Osteopathic Medicine

## 2022-01-05 ENCOUNTER — Emergency Department (HOSPITAL_COMMUNITY): Payer: Medicare Other

## 2022-01-05 DIAGNOSIS — R062 Wheezing: Secondary | ICD-10-CM | POA: Diagnosis not present

## 2022-01-05 DIAGNOSIS — J45901 Unspecified asthma with (acute) exacerbation: Secondary | ICD-10-CM | POA: Diagnosis not present

## 2022-01-05 DIAGNOSIS — Z79899 Other long term (current) drug therapy: Secondary | ICD-10-CM | POA: Diagnosis not present

## 2022-01-05 DIAGNOSIS — I10 Essential (primary) hypertension: Secondary | ICD-10-CM | POA: Insufficient documentation

## 2022-01-05 DIAGNOSIS — R61 Generalized hyperhidrosis: Secondary | ICD-10-CM | POA: Diagnosis not present

## 2022-01-05 DIAGNOSIS — J4531 Mild persistent asthma with (acute) exacerbation: Secondary | ICD-10-CM

## 2022-01-05 DIAGNOSIS — Z1152 Encounter for screening for COVID-19: Secondary | ICD-10-CM | POA: Diagnosis not present

## 2022-01-05 DIAGNOSIS — J4541 Moderate persistent asthma with (acute) exacerbation: Secondary | ICD-10-CM | POA: Diagnosis not present

## 2022-01-05 DIAGNOSIS — R0602 Shortness of breath: Secondary | ICD-10-CM | POA: Diagnosis not present

## 2022-01-05 DIAGNOSIS — J4551 Severe persistent asthma with (acute) exacerbation: Secondary | ICD-10-CM

## 2022-01-05 LAB — CBC WITH DIFFERENTIAL/PLATELET
Abs Immature Granulocytes: 0.02 10*3/uL (ref 0.00–0.07)
Basophils Absolute: 0.1 10*3/uL (ref 0.0–0.1)
Basophils Relative: 1 %
Eosinophils Absolute: 0.8 10*3/uL — ABNORMAL HIGH (ref 0.0–0.5)
Eosinophils Relative: 11 %
HCT: 37.9 % — ABNORMAL LOW (ref 39.0–52.0)
Hemoglobin: 12.3 g/dL — ABNORMAL LOW (ref 13.0–17.0)
Immature Granulocytes: 0 %
Lymphocytes Relative: 37 %
Lymphs Abs: 2.7 10*3/uL (ref 0.7–4.0)
MCH: 28.7 pg (ref 26.0–34.0)
MCHC: 32.5 g/dL (ref 30.0–36.0)
MCV: 88.6 fL (ref 80.0–100.0)
Monocytes Absolute: 0.6 10*3/uL (ref 0.1–1.0)
Monocytes Relative: 8 %
Neutro Abs: 3.2 10*3/uL (ref 1.7–7.7)
Neutrophils Relative %: 43 %
Platelets: 199 10*3/uL (ref 150–400)
RBC: 4.28 MIL/uL (ref 4.22–5.81)
RDW: 15.5 % (ref 11.5–15.5)
WBC: 7.4 10*3/uL (ref 4.0–10.5)
nRBC: 0 % (ref 0.0–0.2)

## 2022-01-05 LAB — TROPONIN I (HIGH SENSITIVITY)
Troponin I (High Sensitivity): 5 ng/L (ref <18)
Troponin I (High Sensitivity): 6 ng/L (ref <18)

## 2022-01-05 LAB — BASIC METABOLIC PANEL
Anion gap: 7 (ref 5–15)
BUN: 12 mg/dL (ref 8–23)
CO2: 23 mmol/L (ref 22–32)
Calcium: 9.1 mg/dL (ref 8.9–10.3)
Chloride: 106 mmol/L (ref 98–111)
Creatinine, Ser: 1 mg/dL (ref 0.61–1.24)
GFR, Estimated: >60 mL/min (ref >60)
Glucose, Bld: 126 mg/dL — ABNORMAL HIGH (ref 70–99)
Potassium: 3.3 mmol/L — ABNORMAL LOW (ref 3.5–5.1)
Sodium: 136 mmol/L (ref 135–145)

## 2022-01-05 LAB — RESP PANEL BY RT-PCR (RSV, FLU A&B, COVID)  RVPGX2
Influenza A by PCR: NEGATIVE
Influenza B by PCR: NEGATIVE
Resp Syncytial Virus by PCR: NEGATIVE
SARS Coronavirus 2 by RT PCR: NEGATIVE

## 2022-01-05 MED ORDER — SODIUM CHLORIDE 0.9% FLUSH
3.0000 mL | Freq: Two times a day (BID) | INTRAVENOUS | Status: DC
  Administered 2022-01-05 – 2022-01-06 (×3): 3 mL via INTRAVENOUS

## 2022-01-05 MED ORDER — ACETAMINOPHEN 650 MG RE SUPP: 650.0000 mg | Freq: Four times a day (QID) | RECTAL | Status: DC | PRN

## 2022-01-05 MED ORDER — FLUTICASONE PROPIONATE HFA 44 MCG/ACT IN AERO
2.0000 | INHALATION_SPRAY | Freq: Every day | RESPIRATORY_TRACT | Status: DC
  Administered 2022-01-05 – 2022-01-06 (×2): 2 via RESPIRATORY_TRACT
  Filled 2022-01-05: qty 10.6

## 2022-01-05 MED ORDER — LACTATED RINGERS IV SOLN: INTRAVENOUS | Status: AC

## 2022-01-05 MED ORDER — POTASSIUM CHLORIDE CRYS ER 20 MEQ PO TBCR
40.0000 meq | EXTENDED_RELEASE_TABLET | Freq: Once | ORAL | Status: DC
  Filled 2022-01-05: qty 2

## 2022-01-05 MED ORDER — GUAIFENESIN ER 600 MG PO TB12: 600.0000 mg | ORAL_TABLET | Freq: Two times a day (BID) | ORAL | Status: DC | PRN

## 2022-01-05 MED ORDER — POTASSIUM CHLORIDE 10 MEQ/100ML IV SOLN
10.0000 meq | INTRAVENOUS | Status: AC
  Administered 2022-01-05 (×2): 10 meq via INTRAVENOUS
  Filled 2022-01-05 (×2): qty 100

## 2022-01-05 MED ORDER — LORAZEPAM 1 MG PO TABS
0.5000 mg | ORAL_TABLET | Freq: Once | ORAL | Status: AC
  Administered 2022-01-05: 0.5 mg via ORAL
  Filled 2022-01-05: qty 1

## 2022-01-05 MED ORDER — SODIUM CHLORIDE 0.9 % IV SOLN: INTRAVENOUS | Status: DC

## 2022-01-05 MED ORDER — AMLODIPINE BESYLATE 10 MG PO TABS
10.0000 mg | ORAL_TABLET | Freq: Every day | ORAL | Status: DC
  Administered 2022-01-06: 10 mg via ORAL
  Filled 2022-01-05: qty 1

## 2022-01-05 MED ORDER — POLYETHYLENE GLYCOL 3350 17 G PO PACK: 17.0000 g | PACK | Freq: Every day | ORAL | Status: DC | PRN

## 2022-01-05 MED ORDER — ALBUTEROL SULFATE (2.5 MG/3ML) 0.083% IN NEBU: 2.5000 mg | INHALATION_SOLUTION | RESPIRATORY_TRACT | Status: DC | PRN

## 2022-01-05 MED ORDER — BISACODYL 5 MG PO TBEC: 5.0000 mg | DELAYED_RELEASE_TABLET | Freq: Every day | ORAL | Status: DC | PRN

## 2022-01-05 MED ORDER — ONDANSETRON HCL 4 MG PO TABS: 4.0000 mg | ORAL_TABLET | Freq: Four times a day (QID) | ORAL | Status: DC | PRN

## 2022-01-05 MED ORDER — ORAL CARE MOUTH RINSE: 15.0000 mL | OROMUCOSAL | Status: DC | PRN

## 2022-01-05 MED ORDER — HYDROCHLOROTHIAZIDE 12.5 MG PO TABS
12.5000 mg | ORAL_TABLET | Freq: Every day | ORAL | Status: DC
  Administered 2022-01-06: 12.5 mg via ORAL
  Filled 2022-01-05: qty 1

## 2022-01-05 MED ORDER — ENOXAPARIN SODIUM 40 MG/0.4ML IJ SOSY
40.0000 mg | PREFILLED_SYRINGE | INTRAMUSCULAR | Status: DC
  Administered 2022-01-05 – 2022-01-06 (×2): 40 mg via SUBCUTANEOUS
  Filled 2022-01-05 (×2): qty 0.4

## 2022-01-05 MED ORDER — PREDNISONE 20 MG PO TABS: 40.0000 mg | ORAL_TABLET | Freq: Every day | ORAL | Status: DC

## 2022-01-05 MED ORDER — METHYLPREDNISOLONE SODIUM SUCC 125 MG IJ SOLR
60.0000 mg | Freq: Two times a day (BID) | INTRAMUSCULAR | Status: AC
  Administered 2022-01-05 – 2022-01-06 (×2): 60 mg via INTRAVENOUS
  Filled 2022-01-05 (×2): qty 2

## 2022-01-05 MED ORDER — ONDANSETRON HCL 4 MG/2ML IJ SOLN: 4.0000 mg | Freq: Four times a day (QID) | INTRAMUSCULAR | Status: DC | PRN

## 2022-01-05 MED ORDER — ACETAMINOPHEN 325 MG PO TABS: 650.0000 mg | ORAL_TABLET | Freq: Four times a day (QID) | ORAL | Status: DC | PRN

## 2022-01-05 MED ORDER — DOCUSATE SODIUM 100 MG PO CAPS
100.0000 mg | ORAL_CAPSULE | Freq: Two times a day (BID) | ORAL | Status: DC
  Administered 2022-01-05 – 2022-01-06 (×3): 100 mg via ORAL
  Filled 2022-01-05 (×3): qty 1

## 2022-01-05 MED ORDER — IPRATROPIUM-ALBUTEROL 0.5-2.5 (3) MG/3ML IN SOLN
3.0000 mL | RESPIRATORY_TRACT | Status: DC
  Administered 2022-01-05 – 2022-01-06 (×5): 3 mL via RESPIRATORY_TRACT
  Filled 2022-01-05: qty 9
  Filled 2022-01-05 (×5): qty 3

## 2022-01-05 MED ORDER — HYDRALAZINE HCL 20 MG/ML IJ SOLN: 5.0000 mg | INTRAMUSCULAR | Status: DC | PRN

## 2022-01-05 MED ORDER — LACTATED RINGERS IV SOLN: INTRAVENOUS | Status: DC

## 2022-01-05 NOTE — ED Notes (Signed)
Neb finished and pt assessed to see if maintains sats. He is 99%.

## 2022-01-05 NOTE — ED Triage Notes (Addendum)
Pt from home called for respiratory distress. RA sats 80%. Left side was not moving any air and diminished on right side. He was unable to speak with EMS arrived due to distress. He has had a total of 20 mg albuterol - 10 via his home neb machine and 10 via EMS. 1 mg of atrovent also given.  2 G Mag and solumedrol '125mg'$  given. He can now speak a few words.

## 2022-01-05 NOTE — H&P (Signed)
History and Physical    Patient: [name] [name] GQQ:[id number] DOB: February 20, 1940 DOA: 01/05/2022 DOS: the patient was seen and examined on 01/05/2022 PCP: Leeroy Cha, MD  Patient coming from: Home - lives with wife; NOK: Hansel Feinstein Jassiel Flye, [phone]   Chief Complaint: Respiratory distress  HPI: [name] [name] is a 81 y.o. male with medical history significant of asthma and HTN presenting with respiratory distress.   He reports that he couldn't breathe.  It came on gradually about 0200.  He uses an inhaler and lately he has needed it 2-3 times a day for the last few days.  No sick contacts.  No fevers.   No significant cough.  He was here 3 weeks ago with the same thing. No prior h/o intubations.  He thinks cold weather might trigger his asthma.  He ran out of Flovent maybe a year ago.    ER Course:  Significant asthma exacerbation.  SOB at 0200, recent URI symptoms (negative COVID/flu/RSV), sats 80%.  Given Albuterol, Mag++, solumedrol.  Much better currently with decreased lung sounds and some accessory muscles.  Giving another Duoneb, K+, IVF.  Stopped Flovent a year ago with a couple of recent exacerbations.  No longer on O2.     Review of Systems: As mentioned in the history of present illness. All other systems reviewed and are negative. Past Medical History:  Diagnosis Date   Asthma    Hypertension    Seasonal allergies    History reviewed. No pertinent surgical history. Social History:  reports that he has never smoked. He has never used smokeless tobacco. He reports that he does not drink alcohol and does not use drugs.  Allergies  Allergen Reactions   Ace Inhibitors Swelling    In the face   Azelastine Hcl Swelling    History reviewed. No pertinent family history.  Prior to Admission medications   Medication Sig Start Date End Date Taking? Authorizing Provider  albuterol (PROAIR HFA) 108 (90 BASE) MCG/ACT inhaler Inhale 1-2 puffs into the lungs  every 4 (four) hours as needed for wheezing or shortness of breath.    Yes [provider]  amLODipine (NORVASC) 10 MG tablet Take 10 mg by mouth daily.   Yes [provider]  calcium carbonate (TUMS - DOSED IN MG ELEMENTAL CALCIUM) 500 MG chewable tablet Chew 1 tablet by mouth daily as needed for indigestion or heartburn.   Yes [provider]  hydrochlorothiazide (HYDRODIURIL) 12.5 MG tablet Take 12.5 mg by mouth daily. 11/06/15  Yes [provider]  ipratropium-albuterol (DUONEB) 0.5-2.5 (3) MG/3ML SOLN Take 3 mLs by nebulization 2 (two) times daily as needed (tightness). 12/03/15  Yes Parrett, Tammy S, NP  potassium chloride SA (KLOR-CON M) 20 MEQ tablet Take one tablet twice a day for ten days, then take one tablet every day 83/3/82  Yes Delora Fuel, MD  chlorproMAZINE (THORAZINE) 25 MG tablet Take 1 tablet (25 mg total) by mouth 3 (three) times daily. Patient not taking: Reported on 01/05/2022 03/05/16   Charlesetta Shanks, MD  fluticasone (FLOVENT HFA) 220 MCG/ACT inhaler Inhale 1 puff into the lungs daily.    [provider]  pantoprazole (PROTONIX) 40 MG tablet Take 1 tablet (40 mg total) by mouth daily. Patient not taking: Reported on 50/53/9767 03/16/17   Delora Fuel, MD  predniSONE (DELTASONE) 50 MG tablet Take 1 tablet (50 mg total) by mouth daily. Patient not taking: Reported on 37/90/2409 73/5/32   Delora Fuel, MD  Physical Exam: Vitals:   01/05/22 1130 01/05/22 1200 01/05/22 1208 01/05/22 1230  BP: 129/78 121/72  118/76  Pulse: 95 90  93  Resp: '17 13  11  '$ Temp:   97.9 F (36.6 C)   TempSrc:   Oral   SpO2: 100% 100%  99%   General:  Appears calm and comfortable and is in NAD, now on RA Eyes:  EOMI, normal lids, iris ENT:  grossly normal hearing, lips & tongue, mmm Neck:  no LAD, masses or thyromegaly Cardiovascular:  RRR, no m/r/g. No LE edema.  Respiratory:   Scattered wheezes with generally poor air movement.  Normal  respiratory effort. Abdomen:  soft, NT, ND Skin:  no rash or induration seen on limited exam Musculoskeletal:  grossly normal tone BUE/BLE, good ROM, no bony abnormality Psychiatric:  blunted mood and affect, speech fluent and appropriate, AOx3 Neurologic:  CN 2-12 grossly intact, moves all extremities in coordinated fashion   Radiological Exams on Admission: Independently reviewed - see discussion in A/P where applicable  DG Chest 2 View  Result Date: 01/05/2022 CLINICAL DATA:  Shortness of breath and wheezing EXAM: CHEST - 2 VIEW COMPARISON:  12/18/2021 FINDINGS: Normal heart size and mediastinal contours. No acute infiltrate or edema. No effusion or pneumothorax. No acute osseous findings. IMPRESSION: No active cardiopulmonary disease. Electronically Signed   By: Jorje Guild M.D.   On: 01/05/2022 05:45    EKG: Independently reviewed.  NSR with rate 97; no evidence of acute ischemia   Labs on Admission: I have personally reviewed the available labs and imaging studies at the time of the admission.  Pertinent labs:    K+ 3.3 Glucose 126 HS troponin 5, 6 WBC 7.4 Hgb 12.3 COVID/flu/RSV negative   Assessment and Plan: Principal Problem:   Asthma exacerbation    Asthma exacerbation -He has history of mild persistent asthma without history of intubation, ran out of Flovent maybe a year ago -He was seen in the ER for similar episode (but less severe) on 12/6 and returned quickly to baseline so was discharged on prednisone -He developed increased SOB requiring frequent use of albuterol over the last few days and marked worsening overnight -Will observe overnight on telemetry; based on rapid improvement, he is likely able to be discharged tomorrow -CXR negative for PNA; no other apparent viral infection; normal WBC count -Nebulizers: prn albuterol and standing Duonebs -Solu-Medrol 60 mg IV BID -> PO prednisone -No antibiotics at this time  -Received magnesium in the ER as  well as continuous neb   HTN -Continue amlodipine, HCTZ    Advance Care Planning:   Code Status: Full Code  - Code status was discussed with the patient and/or family at the time of admission.  The patient would want to receive full resuscitative measures at this time.   Consults: RT, TOC team, nutrition  DVT Prophylaxis: Lovenox  Family Communication: Daughter-in-law was present throughout evaluation  Severity of Illness: The appropriate patient status for this patient is OBSERVATION. Observation status is judged to be reasonable and necessary in order to provide the required intensity of service to ensure the patient's safety. The patient's presenting symptoms, physical exam findings, and initial radiographic and laboratory data in the context of their medical condition is felt to place them at decreased risk for further clinical deterioration. Furthermore, it is anticipated that the patient will be medically stable for discharge from the hospital within 2 midnights of admission.   Author: Karmen Bongo, MD 01/05/2022 1:08 PM  For on call review [email or web address].

## 2022-01-05 NOTE — ED Provider Triage Note (Signed)
Emergency Medicine Provider Triage Evaluation Note  [name] [name] , a 81 y.o. male  was evaluated in triage.  Pt complains of respiratory distress.  Hx of asthma, sats 80% on RA upon EMS arrival.  He received '125mg'$  Solumedrol, 2g Mg+, and '20mg'$  albuterol total PTA with some improvement.  Has had recent cough but denies sick contacts  Review of Systems  Positive: SOB, wheezing Negative: fever  Physical Exam  BP 124/78 (BP Location: Right Arm)   Pulse (!) 126   Temp (!) 97.5 F (36.4 C)   Resp 20   SpO2 99%   Gen:   Awake, no distress   Resp:  Normal effort, wheezing on exam, sounds congested MSK:   Moves extremities without difficulty  Other:    Medical Decision Making  Medically screening exam initiated at 4:13 AM.  Appropriate orders placed.  Juliet Rude was informed that the remainder of the evaluation will be completed by another provider, this initial triage assessment does not replace that evaluation, and the importance of remaining in the ED until their evaluation is complete.  SOB.  Wheezing on exam, O2 stable on RA.  Little tachy but suspect from albuterol.  Re-started on albuterol neb.  EKG, labs, CXR, RVP ordered.   Larene Pickett, PA-C 01/05/22 [phone]

## 2022-01-05 NOTE — ED Notes (Signed)
[phone] [name] [name] wife states to call her with any updates

## 2022-01-05 NOTE — ED Provider Notes (Signed)
[facility] EMERGENCY DEPARTMENT Provider Note   CSN: [id number] Arrival date & time: 01/05/22  0402     History  Chief Complaint  Patient presents with   Shortness of Breath    [name] [name] is a 81 y.o. male.   Shortness of Breath  Patient is an 81 year old male with a past medical history significant for asthma, seasonal allergies, hypertension, nasal polyps  He is presenting emergency room today with complaints of wheezing and shortness of breath that began at 2 AM and got progressively worse.  He states he has had somewhat of a cold recently.  Denies any chest pain hemoptysis fevers vomiting nausea abdominal pain.  He states that he has historically been on Flovent but has not been on this medication for 1 whole year.  He states that he did have an asthma exacerbation earlier this month.  Seems that he had improvement at that time however did not seem to get completely to his normal breathing.  Denies any fevers at home.  Has no history of pneumonia   Per EMS report patient was hypoxic to 80% and had severely diminished lung sounds on initial evaluation.  Also was not able to speak with EMS because of his tachypnea.  He received 20 mg of albuterol, 1 mg of Atrovent, 2 g of mag, Solu-Medrol.    Home Medications Prior to Admission medications   Medication Sig Start Date End Date Taking? Authorizing Provider  albuterol (PROAIR HFA) 108 (90 BASE) MCG/ACT inhaler Inhale 1-2 puffs into the lungs every 4 (four) hours as needed for wheezing or shortness of breath.     [provider]  amLODipine (NORVASC) 10 MG tablet Take 10 mg by mouth daily.    [provider]  amLODipine (NORVASC) 10 MG tablet take 1 tablet by mouth every day for blood pressure    [provider]  chlorproMAZINE (THORAZINE) 25 MG tablet Take 1 tablet (25 mg total) by mouth 3 (three) times daily. 03/05/16   Charlesetta Shanks, MD  fluticasone [facility] ALLERGY RELIEF)  50 MCG/ACT nasal spray 1 spray in each nostril    [provider]  fluticasone (FLOVENT HFA) 220 MCG/ACT inhaler 1 puff    [provider]  hydrochlorothiazide (HYDRODIURIL) 12.5 MG tablet Take 12.5 mg by mouth daily. 11/06/15   [provider]  ipratropium-albuterol (DUONEB) 0.5-2.5 (3) MG/3ML SOLN Take 3 mLs by nebulization 2 (two) times daily as needed (tightness). 12/03/15   Parrett, Fonnie Mu, NP  pantoprazole (PROTONIX) 40 MG tablet Take 1 tablet (40 mg total) by mouth daily. 1/0/93   Delora Fuel, MD  potassium chloride SA (KLOR-CON M) 20 MEQ tablet Take one tablet twice a day for ten days, then take one tablet every day 23/5/57   Delora Fuel, MD  predniSONE (DELTASONE) 50 MG tablet Take 1 tablet (50 mg total) by mouth daily. 32/2/02   Delora Fuel, MD      Allergies    Ace inhibitors and Azelastine hcl    Review of Systems   Review of Systems  Respiratory:  Positive for shortness of breath.     Physical Exam Updated Vital Signs BP 136/84   Pulse (!) 101   Temp 97.9 F (36.6 C) (Oral)   Resp (!) 23   SpO2 100%  Physical Exam Vitals and nursing note reviewed.  Constitutional:      General: He is not in acute distress.    Comments: 81 year old male  HENT:  Head: Normocephalic and atraumatic.     Nose: Nose normal.  Eyes:     General: No scleral icterus. Cardiovascular:     Rate and Rhythm: Normal rate and regular rhythm.     Pulses: Normal pulses.     Heart sounds: Normal heart sounds.  Pulmonary:     Effort: Pulmonary effort is normal. No respiratory distress.     Breath sounds: Decreased breath sounds and wheezing present.     Comments: Patient has diffusely diminished lung sounds.  Expiratory to inspiratory ratio of 3:1, speaking in full sentences but with some accessory muscle usage with breathing.  Mildly tachypneic. Abdominal:     Palpations: Abdomen is soft.     Tenderness: There is no abdominal tenderness.  Musculoskeletal:      Cervical back: Normal range of motion.     Right lower leg: No edema.     Left lower leg: No edema.  Skin:    General: Skin is warm and dry.     Capillary Refill: Capillary refill takes less than 2 seconds.  Neurological:     Mental Status: He is alert. Mental status is at baseline.  Psychiatric:        Mood and Affect: Mood normal.        Behavior: Behavior normal.     ED Results / Procedures / Treatments   Labs (all labs ordered are listed, but only abnormal results are displayed) Labs Reviewed  CBC WITH DIFFERENTIAL/PLATELET - Abnormal; Notable for the following components:      Result Value   Hemoglobin 12.3 (*)    HCT 37.9 (*)    Eosinophils Absolute 0.8 (*)    All other components within normal limits  BASIC METABOLIC PANEL - Abnormal; Notable for the following components:   Potassium 3.3 (*)    Glucose, Bld 126 (*)    All other components within normal limits  RESP PANEL BY RT-PCR (RSV, FLU A&B, COVID)  RVPGX2  TROPONIN I (HIGH SENSITIVITY)  TROPONIN I (HIGH SENSITIVITY)    EKG None  Radiology DG Chest 2 View  Result Date: 01/05/2022 CLINICAL DATA:  Shortness of breath and wheezing EXAM: CHEST - 2 VIEW COMPARISON:  12/18/2021 FINDINGS: Normal heart size and mediastinal contours. No acute infiltrate or edema. No effusion or pneumothorax. No acute osseous findings. IMPRESSION: No active cardiopulmonary disease. Electronically Signed   By: Jorje Guild M.D.   On: 01/05/2022 05:45    Procedures .Critical Care  Performed by: Tedd Sias, PA Authorized by: Tedd Sias, PA   Critical care provider statement:    Critical care time (minutes):  35   Critical care time was exclusive of:  Separately billable procedures and treating other patients and teaching time   Critical care was time spent personally by me on the following activities:  Development of treatment plan with patient or surrogate, review of old charts, re-evaluation of patient's condition,  pulse oximetry, ordering and review of radiographic studies, ordering and review of laboratory studies, ordering and performing treatments and interventions, obtaining history from patient or surrogate, examination of patient and evaluation of patient's response to treatment   Care discussed with: admitting provider       Medications Ordered in ED Medications  potassium chloride SA (KLOR-CON M) CR tablet 40 mEq (40 mEq Oral Patient Refused/Not Given 01/05/22 1039)  ipratropium-albuterol (DUONEB) 0.5-2.5 (3) MG/3ML nebulizer solution 3 mL (has no administration in time range)  fluticasone (FLOVENT HFA) 44 MCG/ACT inhaler 2 puff (has  no administration in time range)  potassium chloride 10 mEq in 100 mL IVPB (has no administration in time range)  0.9 %  sodium chloride infusion (has no administration in time range)  LORazepam (ATIVAN) tablet 0.5 mg (0.5 mg Oral Given 01/05/22 1038)    ED Course/ Medical Decision Making/ A&P                           Medical Decision Making Risk Prescription drug management. Decision regarding hospitalization.   This patient presents to the ED for concern of shortness of breath, this involves a number of treatment options, and is a complaint that carries with it a high risk of complications and morbidity. A differential diagnosis was considered for the patient's symptoms which is discussed below:   The causes for shortness of breath include but are not limited to Cardiac (AHF, pericardial effusion and tamponade, arrhythmias, ischemia, etc) Respiratory (COPD, asthma, pneumonia, pneumothorax, primary pulmonary hypertension, PE/VQ mismatch) Hematological (anemia) Neuromuscular (ALS, Guillain-Barr, etc)    Co morbidities: Discussed in HPI   Brief History:  Patient is an 81 year old male with a past medical history significant for asthma, seasonal allergies, hypertension, nasal polyps  He is presenting emergency room today with complaints of wheezing  and shortness of breath that began at 2 AM and got progressively worse.  He states he has had somewhat of a cold recently.  Denies any chest pain hemoptysis fevers vomiting nausea abdominal pain.  He states that he has historically been on Flovent but has not been on this medication for 1 whole year.  He states that he did have an asthma exacerbation earlier this month.  Seems that he had improvement at that time however did not seem to get completely to his normal breathing.  Denies any fevers at home.  Has no history of pneumonia   Per EMS report patient was hypoxic to 80% and had severely diminished lung sounds on initial evaluation.  Also was not able to speak with EMS because of his tachypnea.  He received 20 mg of albuterol, 1 mg of Atrovent, 2 g of mag, Solu-Medrol.    EMR reviewed including pt PMHx, past surgical history and past visits to ER.   See HPI for more details   Lab Tests:   I ordered and independently interpreted labs. Labs notable for mild hypokalemia will tx here.   CBC without clinically significant anemia, no leukocytosis.  Troponins   Imaging Studies:  NAD. I personally reviewed all imaging studies and no acute abnormality found. I agree with radiology interpretation.     Cardiac Monitoring:  The patient was maintained on a cardiac monitor.  I personally viewed and interpreted the cardiac monitored which showed an underlying rhythm of: NSR EKG non-ischemic   Medicines ordered:  I ordered medication including Duoneb, main fluids, ativan for anxiety from albuterol, K+  for low K. Reevaluation of the patient after these medicines showed that the patient improved I have reviewed the patients home medicines and have made adjustments as needed   Critical Interventions:     Consults/Attending Physician      Reevaluation:  After the interventions noted above I re-evaluated patient and found that they have :improved   Social Determinants of  Health:      Problem List / ED Course:  Wheezing and increased WOB. Much improved in the ER and after EMS aggressive therapy. Given age and sx and continued poor air movement (although not  tachypneic or hypoxic now) will admit. Pt was hypoxic to 80% - this represeents a severe exacerbation. Likely would benefit from ICS.    Dispostion:  After consideration of the diagnostic results and the patients response to treatment, I feel that the patent would benefit from admission.   Final Clinical Impression(s) / ED Diagnoses Final diagnoses:  Severe persistent asthma with acute exacerbation    Rx / DC Orders ED Discharge Orders     None         Tedd Sias, [place] 01/05/22 1637    Pattricia Boss, MD 01/08/22 1649

## 2022-01-06 DIAGNOSIS — J4531 Mild persistent asthma with (acute) exacerbation: Secondary | ICD-10-CM | POA: Diagnosis not present

## 2022-01-06 DIAGNOSIS — J45901 Unspecified asthma with (acute) exacerbation: Secondary | ICD-10-CM | POA: Diagnosis not present

## 2022-01-06 LAB — BASIC METABOLIC PANEL
Anion gap: 9 (ref 5–15)
BUN: 16 mg/dL (ref 8–23)
CO2: 24 mmol/L (ref 22–32)
Calcium: 9.3 mg/dL (ref 8.9–10.3)
Chloride: 106 mmol/L (ref 98–111)
Creatinine, Ser: 0.99 mg/dL (ref 0.61–1.24)
GFR, Estimated: >60 mL/min (ref >60)
Glucose, Bld: 126 mg/dL — ABNORMAL HIGH (ref 70–99)
Potassium: 4.5 mmol/L (ref 3.5–5.1)
Sodium: 139 mmol/L (ref 135–145)

## 2022-01-06 LAB — CBC
HCT: 34 % — ABNORMAL LOW (ref 39.0–52.0)
Hemoglobin: 11.4 g/dL — ABNORMAL LOW (ref 13.0–17.0)
MCH: 28.7 pg (ref 26.0–34.0)
MCHC: 33.5 g/dL (ref 30.0–36.0)
MCV: 85.6 fL (ref 80.0–100.0)
Platelets: 179 10*3/uL (ref 150–400)
RBC: 3.97 MIL/uL — ABNORMAL LOW (ref 4.22–5.81)
RDW: 15.5 % (ref 11.5–15.5)
WBC: 7 10*3/uL (ref 4.0–10.5)
nRBC: 0 % (ref 0.0–0.2)

## 2022-01-06 MED ORDER — PREDNISONE 20 MG PO TABS: 40.0000 mg | ORAL_TABLET | Freq: Every day | ORAL | 0 refills | Status: AC

## 2022-01-06 MED ORDER — FLUTICASONE PROPIONATE HFA 220 MCG/ACT IN AERO: 1.0000 | INHALATION_SPRAY | Freq: Every day | RESPIRATORY_TRACT | 0 refills | Status: DC

## 2022-01-06 MED ORDER — GUAIFENESIN ER 600 MG PO TB12: 600.0000 mg | ORAL_TABLET | Freq: Two times a day (BID) | ORAL | Status: DC | PRN

## 2022-01-06 MED ORDER — ALBUTEROL SULFATE HFA 108 (90 BASE) MCG/ACT IN AERS: 1.0000 | INHALATION_SPRAY | RESPIRATORY_TRACT | 0 refills | Status: DC | PRN

## 2022-01-06 MED ORDER — IPRATROPIUM-ALBUTEROL 0.5-2.5 (3) MG/3ML IN SOLN: 3.0000 mL | Freq: Two times a day (BID) | RESPIRATORY_TRACT | 0 refills | Status: DC | PRN

## 2022-01-06 NOTE — Care Management Obs Status (Signed)
[facility] NOTIFICATION   Patient Details  Name: [name] [name] MRN: [id number] Date of Birth: 03-07-1940   Medicare Observation Status Notification Given:  Yes    Tom-Johnson, Renea Ee, RN 01/06/2022, 10:35 AM

## 2022-01-06 NOTE — Hospital Course (Signed)
[name] [name] is a 81 y.o. male with medical history significant of asthma and HTN presenting with respiratory distress.   He reports that he couldn't breathe.  It came on gradually about 0200.  He uses an inhaler and lately he has needed it 2-3 times a day for the last few days.  No sick contacts.  No fevers.   No significant cough.  He was here 3 weeks ago with the same thing. No prior h/o intubations.  He thinks cold weather might trigger his asthma.  He ran out of Flovent maybe a year ago.  12/24: in ED negative COVID/flu/RSV, sats 80% RA. Given Albuterol, Mag++, solumedrol. Much better however exam decreased lung sounds and some accessory muscles. Given another Duoneb, K+, IVF. Back on room air.   Consultants:  none  Procedures: none      ASSESSMENT & PLAN:   Principal Problem:   Asthma exacerbation  Asthma exacerbation history of mild persistent asthma without history of intubation ran out of Flovent maybe a year ago He was seen in the ER for similar episode (but less severe) on 12/6 and returned quickly to baseline so was discharged on prednisone CXR negative for PNA; no other apparent viral infection; normal WBC count  Will observe overnight on telemetry; based on rapid improvement, he is likely able to be discharged tomorrow Nebulizers: prn albuterol and standing Duonebs Solu-Medrol 60 mg IV BID -> PO prednisone No antibiotics at this time  Received magnesium in the ER as well as continuous neb    HTN Continue amlodipine, HCTZ   DVT prophylaxis: *** Pertinent IV fluids/nutrition: *** Central lines / invasive devices: ***  Code Status: *** Family Communication: ***  Disposition: *** TOC needs: *** Barriers to discharge / significant pending items: ***

## 2022-01-06 NOTE — Plan of Care (Signed)
  Problem: Education: Goal: Knowledge of disease or condition will improve Outcome: Progressing Goal: Knowledge of the prescribed therapeutic regimen will improve Outcome: Progressing   Problem: Activity: Goal: Ability to tolerate increased activity will improve Outcome: Progressing Goal: Will verbalize the importance of balancing activity with adequate rest periods Outcome: Progressing   Problem: Respiratory: Goal: Ability to maintain a clear airway will improve Outcome: Progressing Goal: Levels of oxygenation will improve Outcome: Progressing Goal: Ability to maintain adequate ventilation will improve Outcome: Progressing   

## 2022-01-06 NOTE — TOC Transition Note (Signed)
Transition of Care [facility]) - CM/SW Discharge Note   Patient Details  Name: [name] [name] MRN: [id number] Date of Birth: Apr 05, 1940  [facility] of [facility] CM/SW Contact:  Tom-Johnson, Renea Ee, RN Phone Number: 01/06/2022, 2:27 PM   Clinical Narrative:     Patient is scheduled for discharge today. No TOC needs or recommendations noted. Family to transport at discharge. No further TOC needs noted.     Final next level of care: Home/Self Care Barriers to Discharge: Barriers Resolved   Patient Goals and CMS Choice CMS Medicare.gov Compare Post Acute Care list provided to:: Patient Choice offered to / list presented to : NA  Discharge Placement                  Patient to be transferred to facility by: Family      Discharge Plan and Services Additional resources added to the After Visit Summary for                  DME Arranged: N/A DME Agency: NA       HH Arranged: NA HH Agency: NA        Social Determinants of Health (SDOH) Interventions SDOH Screenings   Food Insecurity: No Food Insecurity (01/05/2022)  Housing: Low Risk  (01/05/2022)  Transportation Needs: No Transportation Needs (01/05/2022)  Utilities: Not At Risk (01/05/2022)  Tobacco Use: Low Risk  (01/05/2022)     Readmission Risk Interventions     No data to display

## 2022-01-06 NOTE — Progress Notes (Signed)
O2 sat at rest on room air 100%.  Test with Exertion: O2 sat at exertion on room air 98%, then O2 sat at exertion on 1L/min of O2 100%.

## 2022-01-06 NOTE — Discharge Summary (Signed)
Physician Discharge Summary   Patient: [name] [name] MRN: [id number]  DOB: 1940-06-08   Admit:     Date of Admission: 01/05/2022 Admitted from: home   Discharge: Date of discharge: 01/06/22 Disposition: Home Condition at discharge: good  CODE STATUS: FULL CODE     Discharge Physician: Emeterio Reeve, DO [facility]     PCP: Leeroy Cha, MD  Recommendations for Outpatient Follow-up:  Follow up with PCP Leeroy Cha, MD in 2-4 weeks Please follow up on the following pending results: none    Discharge Instructions     Diet - low sodium heart healthy   Complete by: As directed    Increase activity slowly   Complete by: As directed          Discharge Diagnoses: Principal Problem:   Asthma exacerbation       Hospital Course: [name] [name] is a 81 y.o. male with medical history significant of asthma and HTN presenting with respiratory distress.   He reports that he couldn't breathe.  It came on gradually about 0200.  He uses an inhaler and lately he has needed it 2-3 times a day for the last few days.  No sick contacts.  No fevers.   No significant cough.  He was here 3 weeks ago with the same thing. No prior h/o intubations.  He thinks cold weather might trigger his asthma.  He ran out of Flovent maybe a year ago.  12/24: in ED negative COVID/flu/RSV, sats 80% RA. Given Albuterol, Mag++, solumedrol. Much better however exam decreased lung sounds and some accessory muscles. Given another Duoneb, K+, IVF. Back on room air.  12/25: remains on room air and saturating well w/ ambulation on RA. OK for discharge. Noted pt's preferred pharmacy is closed today, he declined Rx to pharmacy that was open and states he will get refills tomorrow.   Consultants:  none  Procedures: none      ASSESSMENT & PLAN:   Principal Problem:   Asthma exacerbation  Asthma exacerbation history of mild persistent asthma without history of  intubation ran out of Flovent maybe a year ago He was seen in the ER for similar episode (but less severe) on 12/6 and returned quickly to baseline so was discharged on prednisone CXR negative for PNA; no other apparent viral infection; normal WBC count Observed overnight on telemetry; based on rapid improvement, he is stable for discharge today  Nebulizers: prn albuterol and Rx sent for Duonebs as well at [facility] if albuterol not helping, pt confirms he has nebulizer at home  Solu-Medrol 60 mg IV BID -> PO prednisone No antibiotics necessary Received magnesium in the ER as well as continuous neb    HTN Continue amlodipine, HCTZ            Discharge Instructions  Allergies as of 01/06/2022       Reactions   Ace Inhibitors Swelling   In the face   Azelastine Hcl Swelling        Medication List     TAKE these medications    albuterol 108 (90 Base) MCG/ACT inhaler Commonly known as: ProAir HFA Inhale 1-2 puffs into the lungs every 4 (four) hours as needed for wheezing or shortness of breath.   amLODipine 10 MG tablet Commonly known as: NORVASC Take 10 mg by mouth daily.   calcium carbonate 500 MG chewable tablet Commonly known as: TUMS - dosed in mg elemental calcium Chew 1 tablet by mouth daily as  needed for indigestion or heartburn.   fluticasone 220 MCG/ACT inhaler Commonly known as: Flovent HFA Inhale 1 puff into the lungs daily.   guaiFENesin 600 MG 12 hr tablet Commonly known as: MUCINEX Take 1 tablet (600 mg total) by mouth 2 (two) times daily as needed for cough or to loosen phlegm.   hydrochlorothiazide 12.5 MG tablet Commonly known as: HYDRODIURIL Take 12.5 mg by mouth daily.   ipratropium-albuterol 0.5-2.5 (3) MG/3ML Soln Commonly known as: DUONEB Take 3 mLs by nebulization 2 (two) times daily as needed (/wheezing not helped by albuterol inhaler). What changed: reasons to take this   potassium chloride SA 20 MEQ tablet Commonly known as:  KLOR-CON M Take one tablet twice a day for ten days, then take one tablet every day   predniSONE 20 MG tablet Commonly known as: DELTASONE Take 2 tablets (40 mg total) by mouth daily with breakfast for 3 days. Start taking on: January 07, 2022          Allergies  Allergen Reactions   Ace Inhibitors Swelling    In the face   Azelastine Hcl Swelling     Subjective: pt reports improvement, mild wheezing this am    Discharge Exam: BP 120/64 (BP Location: Left Arm)   Pulse 85   Temp 98.1 F (36.7 C) (Oral)   Resp 16   Ht '5\' 11"'$  (1.803 m)   Wt 86.2 kg   SpO2 97%   BMI 26.50 kg/m  General: Pt is alert, awake, not in acute distress Cardiovascular: RRR, S1/S2 +, no rubs, no gallops Respiratory: CTA bilaterally, no wheezing, no rhonchi Abdominal: Soft, NT, ND Extremities: no edema, no cyanosis     The results of significant diagnostics from this hospitalization (including imaging, microbiology, ancillary and laboratory) are listed below for reference.     Microbiology: Recent Results (from the past 240 hour(s))  Resp panel by RT-PCR (RSV, Flu A&B, Covid) Anterior Nasal Swab     Status: None   Collection Time: 01/05/22  4:19 AM   Specimen: Anterior Nasal Swab  Result Value Ref Range Status   SARS Coronavirus 2 by RT PCR NEGATIVE NEGATIVE Final    Comment: (NOTE) SARS-CoV-2 target nucleic acids are NOT DETECTED.  The SARS-CoV-2 RNA is generally detectable in upper respiratory specimens during the acute phase of infection. The lowest concentration of SARS-CoV-2 viral copies this assay can detect is 138 copies/mL. A negative result does not preclude SARS-Cov-2 infection and should not be used as the sole basis for treatment or other patient management decisions. A negative result may occur with  improper specimen collection/handling, submission of specimen other than nasopharyngeal swab, presence of viral mutation(s) within the areas targeted by this assay, and  inadequate number of viral copies(<138 copies/mL). A negative result must be combined with clinical observations, patient history, and epidemiological information. The expected result is Negative.  Fact Sheet for Patients:  EntrepreneurPulse.com.au  Fact Sheet for Healthcare Providers:  IncredibleEmployment.be  This test is no t yet approved or cleared by the [place] FDA and  has been authorized for detection and/or diagnosis of SARS-CoV-2 by FDA under an Emergency Use Authorization (EUA). This EUA will remain  in effect (meaning this test can be used) for the duration of the COVID-19 declaration under Section 564(b)(1) of the Act, 21 U.S.C.section 360bbb-3(b)(1), unless the authorization is terminated  or revoked sooner.       Influenza A by PCR NEGATIVE NEGATIVE Final   Influenza B by PCR NEGATIVE  NEGATIVE Final    Comment: (NOTE) The Xpert Xpress SARS-CoV-2/FLU/RSV plus assay is intended as an aid in the diagnosis of influenza from Nasopharyngeal swab specimens and should not be used as a sole basis for treatment. Nasal washings and aspirates are unacceptable for Xpert Xpress SARS-CoV-2/FLU/RSV testing.  Fact Sheet for Patients: EntrepreneurPulse.com.au  Fact Sheet for Healthcare Providers: IncredibleEmployment.be  This test is not yet approved or cleared by the [place] FDA and has been authorized for detection and/or diagnosis of SARS-CoV-2 by FDA under an Emergency Use Authorization (EUA). This EUA will remain in effect (meaning this test can be used) for the duration of the COVID-19 declaration under Section 564(b)(1) of the Act, 21 U.S.C. section 360bbb-3(b)(1), unless the authorization is terminated or revoked.     Resp Syncytial Virus by PCR NEGATIVE NEGATIVE Final    Comment: (NOTE) Fact Sheet for Patients: EntrepreneurPulse.com.au  Fact Sheet for Healthcare  Providers: IncredibleEmployment.be  This test is not yet approved or cleared by the [place] FDA and has been authorized for detection and/or diagnosis of SARS-CoV-2 by FDA under an Emergency Use Authorization (EUA). This EUA will remain in effect (meaning this test can be used) for the duration of the COVID-19 declaration under Section 564(b)(1) of the Act, 21 U.S.C. section 360bbb-3(b)(1), unless the authorization is terminated or revoked.  Performed at [facility], [place] [address] [place], [place] [address]      Labs: BNP (last 3 results) No results for input(s): "BNP" in the last 8760 hours. Basic Metabolic Panel: Recent Labs  Lab 01/05/22 0421 01/06/22 0253  NA 136 139  K 3.3* 4.5  CL 106 106  CO2 23 24  GLUCOSE 126* 126*  BUN 12 16  CREATININE 1.00 0.99  CALCIUM 9.1 9.3   Liver Function Tests: No results for input(s): "AST", "ALT", "ALKPHOS", "BILITOT", "PROT", "ALBUMIN" in the last 168 hours. No results for input(s): "LIPASE", "AMYLASE" in the last 168 hours. No results for input(s): "AMMONIA" in the last 168 hours. CBC: Recent Labs  Lab 01/05/22 0421 01/06/22 0253  WBC 7.4 7.0  NEUTROABS 3.2  --   HGB 12.3* 11.4*  HCT 37.9* 34.0*  MCV 88.6 85.6  PLT 199 179   Cardiac Enzymes: No results for input(s): "CKTOTAL", "CKMB", "CKMBINDEX", "TROPONINI" in the last 168 hours. BNP: Invalid input(s): "POCBNP" CBG: No results for input(s): "GLUCAP" in the last 168 hours. D-Dimer No results for input(s): "DDIMER" in the last 72 hours. Hgb A1c No results for input(s): "HGBA1C" in the last 72 hours. Lipid Profile No results for input(s): "CHOL", "HDL", "LDLCALC", "TRIG", "CHOLHDL", "LDLDIRECT" in the last 72 hours. Thyroid function studies No results for input(s): "TSH", "T4TOTAL", "T3FREE", "THYROIDAB" in the last 72 hours.  Invalid input(s): "FREET3" Anemia work up No results for input(s): "VITAMINB12", "FOLATE", "FERRITIN",  "TIBC", "IRON", "RETICCTPCT" in the last 72 hours. Urinalysis    Component Value Date/Time   COLORURINE YELLOW 01/14/2018 2100   APPEARANCEUR CLEAR 01/14/2018 2100   LABSPEC 1.017 01/14/2018 2100   PHURINE 6.0 01/14/2018 2100   GLUCOSEU NEGATIVE 01/14/2018 2100   HGBUR NEGATIVE 01/14/2018 2100   [place] NEGATIVE 01/14/2018 2100   KETONESUR NEGATIVE 01/14/2018 2100   PROTEINUR NEGATIVE 01/14/2018 2100   UROBILINOGEN 1.0 10/19/2010 0821   NITRITE NEGATIVE 01/14/2018 2100   LEUKOCYTESUR MODERATE (A) 01/14/2018 2100   Sepsis Labs Recent Labs  Lab 01/05/22 0421 01/06/22 0253  WBC 7.4 7.0   Microbiology Recent Results (from the past 240 hour(s))  Resp  panel by RT-PCR (RSV, Flu A&B, Covid) Anterior Nasal Swab     Status: None   Collection Time: 01/05/22  4:19 AM   Specimen: Anterior Nasal Swab  Result Value Ref Range Status   SARS Coronavirus 2 by RT PCR NEGATIVE NEGATIVE Final    Comment: (NOTE) SARS-CoV-2 target nucleic acids are NOT DETECTED.  The SARS-CoV-2 RNA is generally detectable in upper respiratory specimens during the acute phase of infection. The lowest concentration of SARS-CoV-2 viral copies this assay can detect is 138 copies/mL. A negative result does not preclude SARS-Cov-2 infection and should not be used as the sole basis for treatment or other patient management decisions. A negative result may occur with  improper specimen collection/handling, submission of specimen other than nasopharyngeal swab, presence of viral mutation(s) within the areas targeted by this assay, and inadequate number of viral copies(<138 copies/mL). A negative result must be combined with clinical observations, patient history, and epidemiological information. The expected result is Negative.  Fact Sheet for Patients:  EntrepreneurPulse.com.au  Fact Sheet for Healthcare Providers:  IncredibleEmployment.be  This test is no t yet approved or  cleared by the [place] FDA and  has been authorized for detection and/or diagnosis of SARS-CoV-2 by FDA under an Emergency Use Authorization (EUA). This EUA will remain  in effect (meaning this test can be used) for the duration of the COVID-19 declaration under Section 564(b)(1) of the Act, 21 U.S.C.section 360bbb-3(b)(1), unless the authorization is terminated  or revoked sooner.       Influenza A by PCR NEGATIVE NEGATIVE Final   Influenza B by PCR NEGATIVE NEGATIVE Final    Comment: (NOTE) The Xpert Xpress SARS-CoV-2/FLU/RSV plus assay is intended as an aid in the diagnosis of influenza from Nasopharyngeal swab specimens and should not be used as a sole basis for treatment. Nasal washings and aspirates are unacceptable for Xpert Xpress SARS-CoV-2/FLU/RSV testing.  Fact Sheet for Patients: EntrepreneurPulse.com.au  Fact Sheet for Healthcare Providers: IncredibleEmployment.be  This test is not yet approved or cleared by the [place] FDA and has been authorized for detection and/or diagnosis of SARS-CoV-2 by FDA under an Emergency Use Authorization (EUA). This EUA will remain in effect (meaning this test can be used) for the duration of the COVID-19 declaration under Section 564(b)(1) of the Act, 21 U.S.C. section 360bbb-3(b)(1), unless the authorization is terminated or revoked.     Resp Syncytial Virus by PCR NEGATIVE NEGATIVE Final    Comment: (NOTE) Fact Sheet for Patients: EntrepreneurPulse.com.au  Fact Sheet for Healthcare Providers: IncredibleEmployment.be  This test is not yet approved or cleared by the [place] FDA and has been authorized for detection and/or diagnosis of SARS-CoV-2 by FDA under an Emergency Use Authorization (EUA). This EUA will remain in effect (meaning this test can be used) for the duration of the COVID-19 declaration under Section 564(b)(1) of the Act, 21  U.S.C. section 360bbb-3(b)(1), unless the authorization is terminated or revoked.  Performed at [facility], [place] [address], [place], [place] [address]    Imaging DG Chest 2 View  Result Date: 01/05/2022 CLINICAL DATA:  Shortness of breath and wheezing EXAM: CHEST - 2 VIEW COMPARISON:  12/18/2021 FINDINGS: Normal heart size and mediastinal contours. No acute infiltrate or edema. No effusion or pneumothorax. No acute osseous findings. IMPRESSION: No active cardiopulmonary disease. Electronically Signed   By: Jorje Guild M.D.   On: 01/05/2022 05:45      Time coordinating discharge: over 30 minutes  SIGNED:  Lanelle Bal  [facility]

## 2022-01-07 ENCOUNTER — Other Ambulatory Visit (HOSPITAL_BASED_OUTPATIENT_CLINIC_OR_DEPARTMENT_OTHER): Payer: Self-pay | Admitting: Osteopathic Medicine

## 2022-01-23 DIAGNOSIS — Z1331 Encounter for screening for depression: Secondary | ICD-10-CM | POA: Diagnosis not present

## 2022-01-23 DIAGNOSIS — E669 Obesity, unspecified: Secondary | ICD-10-CM | POA: Diagnosis not present

## 2022-01-23 DIAGNOSIS — Z0000 Encounter for general adult medical examination without abnormal findings: Secondary | ICD-10-CM | POA: Diagnosis not present

## 2022-01-23 DIAGNOSIS — R7303 Prediabetes: Secondary | ICD-10-CM | POA: Diagnosis not present

## 2022-01-23 DIAGNOSIS — J4530 Mild persistent asthma, uncomplicated: Secondary | ICD-10-CM | POA: Diagnosis not present

## 2022-01-23 DIAGNOSIS — R269 Unspecified abnormalities of gait and mobility: Secondary | ICD-10-CM | POA: Diagnosis not present

## 2022-01-23 DIAGNOSIS — I10 Essential (primary) hypertension: Secondary | ICD-10-CM | POA: Diagnosis not present

## 2022-01-23 DIAGNOSIS — E440 Moderate protein-calorie malnutrition: Secondary | ICD-10-CM | POA: Diagnosis not present

## 2022-01-23 DIAGNOSIS — E7800 Pure hypercholesterolemia, unspecified: Secondary | ICD-10-CM | POA: Diagnosis not present

## 2022-01-23 DIAGNOSIS — R7309 Other abnormal glucose: Secondary | ICD-10-CM | POA: Diagnosis not present

## 2022-02-03 ENCOUNTER — Other Ambulatory Visit (HOSPITAL_BASED_OUTPATIENT_CLINIC_OR_DEPARTMENT_OTHER): Payer: Self-pay | Admitting: Osteopathic Medicine

## 2022-04-16 ENCOUNTER — Encounter: Payer: Self-pay | Admitting: Podiatry

## 2022-04-16 ENCOUNTER — Ambulatory Visit (INDEPENDENT_AMBULATORY_CARE_PROVIDER_SITE_OTHER): Payer: Medicare Other | Admitting: Podiatry

## 2022-04-16 VITALS — BP 123/73

## 2022-04-16 DIAGNOSIS — M79675 Pain in left toe(s): Secondary | ICD-10-CM | POA: Diagnosis not present

## 2022-04-16 DIAGNOSIS — E669 Obesity, unspecified: Secondary | ICD-10-CM | POA: Insufficient documentation

## 2022-04-16 DIAGNOSIS — B351 Tinea unguium: Secondary | ICD-10-CM | POA: Diagnosis not present

## 2022-04-16 DIAGNOSIS — M79674 Pain in right toe(s): Secondary | ICD-10-CM

## 2022-04-16 DIAGNOSIS — R7303 Prediabetes: Secondary | ICD-10-CM | POA: Insufficient documentation

## 2022-04-16 NOTE — Progress Notes (Signed)
  Subjective:  Patient ID: [name] [name], male    DOB: [birth year],  MRN: [id number]  82 y.o. male presents for: Chief Complaint  Patient presents with   Nail Problem    [facility] PCP-Varadarajan PCP VST-02/2022   New problem(s): None   PCP is Leeroy Cha, MD.  Allergies  Allergen Reactions   Ace Inhibitors Swelling    In the face   Azelastine Hcl Swelling    Review of Systems: Negative except as noted in the HPI.   Objective:  [name] [name] is a pleasant 82 y.o. male WD, WN in NAD.Marland Kitchen AAO x 3.  Vascular Examination: Vascular status intact b/l with palpable pedal pulses. CFT immediate b/l. Pedal hair present. No edema. No pain with calf compression b/l. Skin temperature gradient WNL b/l. No varicosities noted.  Neurological Examination: Sensation grossly intact b/l with 10 gram monofilament. Vibratory sensation intact b/l.  Dermatological Examination: Pedal skin with normal turgor, texture and tone b/l. No open wounds nor interdigital macerations noted. Toenails 1-5 b/l thick, discolored, elongated with subungual debris and pain on dorsal palpation. No hyperkeratotic lesions noted b/l.   Known soft tissue mass consistent with ganglion cyst PIPJ of digit managed by Dr. Boneta Lucks. No changes noted and lesion is asymptomatic.  Musculoskeletal Examination: Muscle strength 5/5 to b/l LE.  No pain, crepitus noted b/l. Hammertoe deformity noted 2-5 b/l. Patient ambulates independently without assistive aids.   Radiographs: None   Assessment:   1. Pain due to onychomycosis of toenails of both feet    Plan:  -Patient's family member present. All questions/concerns addressed on today's visit. -Consent given for treatment as described below: -Examined patient. -Digital ganglion cyst asymptomatic. Monitor. -Patient to continue soft, supportive shoe gear daily. -Toenails 1-5 b/l were debrided in length and girth with sterile nail nippers and dremel without iatrogenic  bleeding.  -Patient/POA to call should there be question/concern in the interim.  Return in about 3 months (around 07/16/2022).  Marzetta Board, DPM

## 2022-07-24 DIAGNOSIS — M25559 Pain in unspecified hip: Secondary | ICD-10-CM | POA: Diagnosis not present

## 2022-07-24 DIAGNOSIS — R32 Unspecified urinary incontinence: Secondary | ICD-10-CM | POA: Diagnosis not present

## 2022-07-24 DIAGNOSIS — I10 Essential (primary) hypertension: Secondary | ICD-10-CM | POA: Diagnosis not present

## 2022-07-24 DIAGNOSIS — M169 Osteoarthritis of hip, unspecified: Secondary | ICD-10-CM | POA: Diagnosis not present

## 2022-07-24 DIAGNOSIS — R000 Tachycardia, unspecified: Secondary | ICD-10-CM | POA: Diagnosis not present

## 2022-07-24 DIAGNOSIS — R262 Difficulty in walking, not elsewhere classified: Secondary | ICD-10-CM | POA: Diagnosis not present

## 2022-07-24 DIAGNOSIS — M47816 Spondylosis without myelopathy or radiculopathy, lumbar region: Secondary | ICD-10-CM | POA: Diagnosis not present

## 2022-07-24 DIAGNOSIS — E440 Moderate protein-calorie malnutrition: Secondary | ICD-10-CM | POA: Diagnosis not present

## 2022-07-24 DIAGNOSIS — E7800 Pure hypercholesterolemia, unspecified: Secondary | ICD-10-CM | POA: Diagnosis not present

## 2022-07-24 DIAGNOSIS — R634 Abnormal weight loss: Secondary | ICD-10-CM | POA: Diagnosis not present

## 2022-07-25 ENCOUNTER — Other Ambulatory Visit: Payer: Self-pay | Admitting: Internal Medicine

## 2022-07-25 DIAGNOSIS — R634 Abnormal weight loss: Secondary | ICD-10-CM

## 2022-08-26 ENCOUNTER — Ambulatory Visit: Payer: Medicare Other | Admitting: Podiatry

## 2022-08-27 ENCOUNTER — Ambulatory Visit (INDEPENDENT_AMBULATORY_CARE_PROVIDER_SITE_OTHER): Payer: Medicare Other | Admitting: Podiatry

## 2022-08-27 ENCOUNTER — Encounter: Payer: Self-pay | Admitting: Podiatry

## 2022-08-27 DIAGNOSIS — B351 Tinea unguium: Secondary | ICD-10-CM

## 2022-08-27 DIAGNOSIS — M79674 Pain in right toe(s): Secondary | ICD-10-CM | POA: Diagnosis not present

## 2022-08-27 DIAGNOSIS — M79675 Pain in left toe(s): Secondary | ICD-10-CM

## 2022-08-27 NOTE — Progress Notes (Signed)
  Subjective:  Patient ID: [name] [name], male    DOB: 07/29/1940,  MRN: [id number]  [name] [name] presents to clinic today for: painful elongated mycotic toenails 1-5 bilaterally which are tender when wearing enclosed shoe gear. Pain is relieved with periodic professional debridement.   PCP is Lorenda Ishihara, MD. LOV 2 weeks ago.  Allergies  Allergen Reactions   Ace Inhibitors Swelling    In the face   Azelastine Hcl Swelling    Review of Systems: Negative except as noted in the HPI.  Objective: No changes noted in today's physical examination. There were no vitals filed for this visit.  [name] [name] is a pleasant 82 y.o. male in NAD. AAO x 3.  Vascular Examination: Capillary refill time <3 seconds b/l LE. Palpable pedal pulses b/l LE. Digital hair present b/l. No pedal edema b/l. Skin temperature gradient WNL b/l. No varicosities b/l. Marland Kitchen  Dermatological Examination: Pedal skin with normal turgor, texture and tone b/l. No open wounds. No interdigital macerations b/l. Toenails 1-5 b/l thickened, discolored, dystrophic with subungual debris. There is pain on palpation to dorsal aspect of nailplates. No corns, calluses nor porokeratotic lesions noted.  Known soft tissue mass consistent with ganglion cyst PIPJ of digit managed by Dr. Nicholes Rough. No changes noted and lesion is asymptomatic.  Neurological Examination: Protective sensation intact with 10 gram monofilament b/l LE. Vibratory sensation intact b/l LE.   Musculoskeletal Examination: Muscle strength 5/5 to all lower extremity muscle groups bilaterally. Hammertoe deformity noted 2-5 b/l.  Assessment/Plan: 1. Pain due to onychomycosis of toenails of both feet    -Patient was evaluated and treated. All patient's and/or POA's questions/concerns answered on today's visit. -Continue foot and shoe inspections daily. Monitor blood glucose per PCP/Endocrinologist's recommendations. -Continue supportive shoe gear  daily. -Mycotic toenails 1-5 bilaterally were debrided in length and girth with sterile nail nippers and dremel without incident. -Patient/POA to call should there be question/concern in the interim.   Return in about 4 months (around 12/27/2022).  Freddie Breech, DPM

## 2022-10-09 DIAGNOSIS — Z23 Encounter for immunization: Secondary | ICD-10-CM | POA: Diagnosis not present

## 2022-11-12 DIAGNOSIS — R350 Frequency of micturition: Secondary | ICD-10-CM | POA: Diagnosis not present

## 2022-11-12 DIAGNOSIS — R351 Nocturia: Secondary | ICD-10-CM | POA: Diagnosis not present

## 2022-11-12 DIAGNOSIS — N3941 Urge incontinence: Secondary | ICD-10-CM | POA: Diagnosis not present

## 2022-11-12 DIAGNOSIS — N401 Enlarged prostate with lower urinary tract symptoms: Secondary | ICD-10-CM | POA: Diagnosis not present

## 2022-12-31 ENCOUNTER — Ambulatory Visit (INDEPENDENT_AMBULATORY_CARE_PROVIDER_SITE_OTHER): Payer: Medicare Other | Admitting: Podiatry

## 2022-12-31 DIAGNOSIS — Z91199 Patient's noncompliance with other medical treatment and regimen due to unspecified reason: Secondary | ICD-10-CM

## 2022-12-31 NOTE — Progress Notes (Signed)
1. No-show for appointment     

## 2023-02-05 DIAGNOSIS — G959 Disease of spinal cord, unspecified: Secondary | ICD-10-CM | POA: Diagnosis not present

## 2023-02-05 DIAGNOSIS — L309 Dermatitis, unspecified: Secondary | ICD-10-CM | POA: Diagnosis not present

## 2023-02-05 DIAGNOSIS — E440 Moderate protein-calorie malnutrition: Secondary | ICD-10-CM | POA: Diagnosis not present

## 2023-02-05 DIAGNOSIS — E7800 Pure hypercholesterolemia, unspecified: Secondary | ICD-10-CM | POA: Diagnosis not present

## 2023-02-05 DIAGNOSIS — I10 Essential (primary) hypertension: Secondary | ICD-10-CM | POA: Diagnosis not present

## 2023-02-05 DIAGNOSIS — Z0000 Encounter for general adult medical examination without abnormal findings: Secondary | ICD-10-CM | POA: Diagnosis not present

## 2023-02-05 DIAGNOSIS — R2681 Unsteadiness on feet: Secondary | ICD-10-CM | POA: Diagnosis not present

## 2023-02-05 DIAGNOSIS — R7303 Prediabetes: Secondary | ICD-10-CM | POA: Diagnosis not present

## 2023-02-05 DIAGNOSIS — J309 Allergic rhinitis, unspecified: Secondary | ICD-10-CM | POA: Diagnosis not present

## 2023-02-05 DIAGNOSIS — J4530 Mild persistent asthma, uncomplicated: Secondary | ICD-10-CM | POA: Diagnosis not present

## 2023-02-05 DIAGNOSIS — M47816 Spondylosis without myelopathy or radiculopathy, lumbar region: Secondary | ICD-10-CM | POA: Diagnosis not present

## 2023-02-05 DIAGNOSIS — M5106 Intervertebral disc disorders with myelopathy, lumbar region: Secondary | ICD-10-CM | POA: Diagnosis not present

## 2023-02-12 DIAGNOSIS — M25551 Pain in right hip: Secondary | ICD-10-CM | POA: Diagnosis not present

## 2023-02-23 DIAGNOSIS — M25551 Pain in right hip: Secondary | ICD-10-CM | POA: Diagnosis not present

## 2023-05-25 DIAGNOSIS — J45901 Unspecified asthma with (acute) exacerbation: Secondary | ICD-10-CM | POA: Diagnosis not present

## 2023-05-25 DIAGNOSIS — I10 Essential (primary) hypertension: Secondary | ICD-10-CM | POA: Diagnosis not present

## 2023-06-03 ENCOUNTER — Ambulatory Visit (INDEPENDENT_AMBULATORY_CARE_PROVIDER_SITE_OTHER): Payer: Medicare Other | Admitting: Podiatry

## 2023-06-03 DIAGNOSIS — M79672 Pain in left foot: Secondary | ICD-10-CM

## 2023-06-03 DIAGNOSIS — L84 Corns and callosities: Secondary | ICD-10-CM

## 2023-06-03 DIAGNOSIS — B351 Tinea unguium: Secondary | ICD-10-CM | POA: Diagnosis not present

## 2023-06-03 DIAGNOSIS — M79674 Pain in right toe(s): Secondary | ICD-10-CM | POA: Diagnosis not present

## 2023-06-03 DIAGNOSIS — M79671 Pain in right foot: Secondary | ICD-10-CM

## 2023-06-03 DIAGNOSIS — M79675 Pain in left toe(s): Secondary | ICD-10-CM

## 2023-06-03 DIAGNOSIS — R7303 Prediabetes: Secondary | ICD-10-CM | POA: Diagnosis not present

## 2023-06-03 NOTE — Progress Notes (Unsigned)
  Subjective:  Patient ID: [name] [name], male    DOB: 1940-12-14,  MRN: [id number]  [name] [name] presents to clinic today for prediabetes, calluses of  left foot and painful mycotic toenails x 10 which interfere with daily activities. Pain is relieved with periodic professional debridement.   New problem(s): None.   PCP is Arva Lathe, MD.  Allergies  Allergen Reactions   Ace Inhibitors Swelling    In the face   Azelastine Hcl Swelling    Review of Systems: Negative except as noted in the HPI.  Objective: No changes noted in today's physical examination. There were no vitals filed for this visit. [name] [name] is a pleasant 83 y.o. male WD, WN in NAD. AAO x 3.  Vascular Examination: Capillary refill time immediate b/l. Vascular status intact b/l with palpable pedal pulses. Pedal hair present b/l. No pain with calf compression b/l. Skin temperature gradient WNL b/l. No cyanosis or clubbing b/l. No ischemia or gangrene noted b/l.   Neurological Examination: Sensation grossly intact b/l with 10 gram monofilament. Vibratory sensation intact b/l.   Dermatological Examination: Pedal skin with normal turgor, texture and tone b/l.  No open wounds. No interdigital macerations.   Toenails 1-5 b/l thick, discolored, elongated with subungual debris and pain on dorsal palpation.   Hyperkeratotic lesion(s) left heel and submet head 5 left foot.  No erythema, no edema, no drainage, no fluctuance.  Musculoskeletal Examination: Muscle strength 5/5 to all lower extremity muscle groups bilaterally. 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  Radiographs: None  Assessment/Plan: 1. Pain due to onychomycosis of toenails of both feet   2. Callus   3. Pain in both feet   4. Prediabetes     Consent given for treatment. Patient examined. All patient's and/or POA's questions/concerns addressed on today's visit.Toenails 1-5 debrided in length and girth without  incident. Callus(es) left heel and submet head 5 left foot pared with sharp debridement without incident. Continue soft, supportive shoe gear daily. Report any pedal injuries to medical professional. Call office if there are any questions/concerns. -Patient/POA to call should there be question/concern in the interim.   Return in about 4 months (around 10/04/2023).  Luella Sager, DPM      [facility] LOCATION: [address] [place] [address]                   Office [phone]   [facility] LOCATION: [address] [place], [place] [address] Office [phone]

## 2023-06-08 ENCOUNTER — Encounter: Payer: Self-pay | Admitting: Podiatry

## 2023-08-03 DIAGNOSIS — J4530 Mild persistent asthma, uncomplicated: Secondary | ICD-10-CM | POA: Diagnosis not present

## 2023-08-03 DIAGNOSIS — E7800 Pure hypercholesterolemia, unspecified: Secondary | ICD-10-CM | POA: Diagnosis not present

## 2023-08-03 DIAGNOSIS — Z7409 Other reduced mobility: Secondary | ICD-10-CM | POA: Diagnosis not present

## 2023-08-03 DIAGNOSIS — M47816 Spondylosis without myelopathy or radiculopathy, lumbar region: Secondary | ICD-10-CM | POA: Diagnosis not present

## 2023-08-03 DIAGNOSIS — I10 Essential (primary) hypertension: Secondary | ICD-10-CM | POA: Diagnosis not present

## 2023-09-15 ENCOUNTER — Encounter (HOSPITAL_COMMUNITY): Payer: Self-pay

## 2023-09-15 ENCOUNTER — Ambulatory Visit (INDEPENDENT_AMBULATORY_CARE_PROVIDER_SITE_OTHER)

## 2023-09-15 ENCOUNTER — Ambulatory Visit (HOSPITAL_COMMUNITY)
Admission: EM | Admit: 2023-09-15 | Discharge: 2023-09-15 | Disposition: A | Discharge status: Home / Self Care | Attending: Family Medicine | Admitting: Family Medicine

## 2023-09-15 DIAGNOSIS — R0602 Shortness of breath: Secondary | ICD-10-CM

## 2023-09-15 DIAGNOSIS — R062 Wheezing: Secondary | ICD-10-CM | POA: Diagnosis not present

## 2023-09-15 DIAGNOSIS — R093 Abnormal sputum: Secondary | ICD-10-CM | POA: Diagnosis not present

## 2023-09-15 DIAGNOSIS — J4521 Mild intermittent asthma with (acute) exacerbation: Secondary | ICD-10-CM

## 2023-09-15 DIAGNOSIS — R058 Other specified cough: Secondary | ICD-10-CM | POA: Diagnosis not present

## 2023-09-15 MED ORDER — IPRATROPIUM-ALBUTEROL 0.5-2.5 (3) MG/3ML IN SOLN
RESPIRATORY_TRACT | Status: AC
  Filled 2023-09-15: qty 3

## 2023-09-15 MED ORDER — DEXAMETHASONE SODIUM PHOSPHATE 10 MG/ML IJ SOLN
INTRAMUSCULAR | Status: AC
  Filled 2023-09-15: qty 1

## 2023-09-15 MED ORDER — DEXAMETHASONE SODIUM PHOSPHATE 10 MG/ML IJ SOLN
10.0000 mg | Freq: Once | INTRAMUSCULAR | Status: AC
  Administered 2023-09-15: 10 mg via INTRAMUSCULAR

## 2023-09-15 MED ORDER — IPRATROPIUM-ALBUTEROL 0.5-2.5 (3) MG/3ML IN SOLN
3.0000 mL | Freq: Once | RESPIRATORY_TRACT | Status: AC
  Administered 2023-09-15: 3 mL via RESPIRATORY_TRACT

## 2023-09-15 MED ORDER — AZITHROMYCIN 250 MG PO TABS: 250.0000 mg | ORAL_TABLET | Freq: Every day | ORAL | 0 refills | Status: DC

## 2023-09-15 NOTE — ED Provider Notes (Signed)
 [facility]   [id number] 09/15/23 Arrival Time: 1130  ASSESSMENT & PLAN:  1. SOB (shortness of breath)   2. Mild intermittent asthma with acute exacerbation    Feeling better after duoneb. I have personally viewed and independently interpreted the imaging studies ordered this visit. CXR: no acute changes.  OTC symptom care as needed.  Meds ordered this encounter  Medications   ipratropium-albuterol  (DUONEB) 0.5-2.5 (3) MG/3ML nebulizer solution 3 mL   dexamethasone  (DECADRON ) injection 10 mg   azithromycin  (ZITHROMAX ) 250 MG tablet    Sig: Take 1 tablet (250 mg total) by mouth daily. Take first 2 tablets together, then 1 every day until finished.    Dispense:  6 tablet    Refill:  0     Follow-up Information     [place] [facility] at [facility].   Specialty: Emergency Medicine Why: If symptoms worsen in any way. Contact information: [address] [place]  [phone]                Reviewed expectations re: course of current medical issues. Questions answered. Outlined signs and symptoms indicating need for more acute intervention. Understanding verbalized. After Visit Summary given.   SUBJECTIVE: History from: Patient. [name] [name] is a 83 y.o. male. Pt c/o productive cough with yellow sputum, wheezing, and SOB x2wks. States has been using his nebulizer more often. Pt speaking in complete sentences. NAD. Denies: fever. Normal PO intake without n/v/d.  OBJECTIVE:  Vitals:   09/15/23 1223  BP: 133/77  Pulse: 81  Resp: 18  Temp: 97.8 F (36.6 C)  TempSrc: Oral  SpO2: 100%    General appearance: alert; no distress Eyes: PERRLA; EOMI; conjunctiva normal HENT: [place]; AT; with nasal congestion Neck: supple  Lungs: speaks full sentences without difficulty; unlabored; bilateral exp wheezing Extremities: no edema Skin: warm and dry Neurologic: normal gait Psychological: alert and  cooperative; normal mood and affect  Labs:  Labs Reviewed - No data to display  Imaging: DG Chest 2 View Result Date: 09/15/2023 EXAM: 2 VIEW(S) XRAY OF THE CHEST 09/15/2023 01:24:24 PM COMPARISON: None available. CLINICAL HISTORY: SOB. Pt c/o productive cough with yellow sputum, wheezing, and SOB x2wks. States has been using his nebulizer more often. Pt speaking in complete sentences. NAD. Pt unable to stand straight up and could not raise up his arms; Pt held arms out in front of him for lateral; Best images obtainable. FINDINGS: LUNGS AND PLEURA: No focal pulmonary opacity. No pulmonary edema. No pleural effusion. No pneumothorax. HEART AND MEDIASTINUM: No acute abnormality of the cardiac and mediastinal silhouettes. BONES AND SOFT TISSUES: No acute fracture or destructive lesion. Multilevel degenerative disc disease of spine. Lateral view degraded by patient arm position, not raised above the head. IMPRESSION: 1. No acute process. Electronically signed by: Rockey Kilts MD 09/15/2023 02:15 PM EDT RP Workstation: [id number]    Allergies  Allergen Reactions   Ace Inhibitors Swelling    In the face   Azelastine Hcl Swelling    Past Medical History:  Diagnosis Date   Asthma    Hypertension    Seasonal allergies    Social History   Socioeconomic History   Marital status: Married    Spouse name: Not on file   Number of children: Not on file   Years of education: Not on file   Highest education level: Not on file  Occupational History   Occupation: retired  Tobacco Use   Smoking status:  Never   Smokeless tobacco: Never  Vaping Use   Vaping status: Never Used  Substance and Sexual Activity   Alcohol use: No   Drug use: No   Sexual activity: Not on file  Other Topics Concern   Not on file  Social History Narrative   Not on file   Social Drivers of Health   Financial Resource Strain: Not on file  Food Insecurity: No Food Insecurity (01/05/2022)   Hunger Vital Sign     Worried About Running Out of Food in the Last Year: Never true    Ran Out of Food in the Last Year: Never true  Transportation Needs: No Transportation Needs (01/05/2022)   PRAPARE - Administrator, Civil Service (Medical): No    Lack of Transportation (Non-Medical): No  Physical Activity: Not on file  Stress: Not on file  Social Connections: Not on file  Intimate Partner Violence: Not At Risk (01/05/2022)   Humiliation, Afraid, Rape, and Kick questionnaire    Fear of Current or Ex-Partner: No    Emotionally Abused: No    Physically Abused: No    Sexually Abused: No   History reviewed. No pertinent family history. History reviewed. No pertinent surgical history.   Rolinda Rogue, MD 09/15/23 [phone]

## 2023-09-15 NOTE — ED Triage Notes (Signed)
 Pt c/o productive cough with yellow sputum, wheezing, and SOB x2wks. States has been using his nebulizer more often. Pt speaking in complete sentences. NAD.

## 2023-09-16 DIAGNOSIS — I10 Essential (primary) hypertension: Secondary | ICD-10-CM | POA: Diagnosis not present

## 2023-09-16 DIAGNOSIS — E440 Moderate protein-calorie malnutrition: Secondary | ICD-10-CM | POA: Diagnosis not present

## 2023-09-16 DIAGNOSIS — Z23 Encounter for immunization: Secondary | ICD-10-CM | POA: Diagnosis not present

## 2023-09-16 DIAGNOSIS — J45901 Unspecified asthma with (acute) exacerbation: Secondary | ICD-10-CM | POA: Diagnosis not present

## 2023-09-23 ENCOUNTER — Ambulatory Visit: Admitting: Podiatry

## 2023-10-13 ENCOUNTER — Emergency Department (HOSPITAL_COMMUNITY)

## 2023-10-13 ENCOUNTER — Observation Stay (HOSPITAL_COMMUNITY)
Admission: EM | Admit: 2023-10-13 | Discharge: 2023-10-15 | Disposition: A | Discharge status: Home / Self Care | Attending: Internal Medicine | Admitting: Internal Medicine

## 2023-10-13 ENCOUNTER — Other Ambulatory Visit: Payer: Self-pay

## 2023-10-13 ENCOUNTER — Encounter (HOSPITAL_COMMUNITY): Payer: Self-pay

## 2023-10-13 DIAGNOSIS — Z79899 Other long term (current) drug therapy: Secondary | ICD-10-CM | POA: Insufficient documentation

## 2023-10-13 DIAGNOSIS — J4541 Moderate persistent asthma with (acute) exacerbation: Principal | ICD-10-CM | POA: Insufficient documentation

## 2023-10-13 DIAGNOSIS — R059 Cough, unspecified: Secondary | ICD-10-CM | POA: Diagnosis not present

## 2023-10-13 DIAGNOSIS — I10 Essential (primary) hypertension: Secondary | ICD-10-CM | POA: Insufficient documentation

## 2023-10-13 DIAGNOSIS — R0602 Shortness of breath: Secondary | ICD-10-CM | POA: Diagnosis not present

## 2023-10-13 DIAGNOSIS — R0600 Dyspnea, unspecified: Secondary | ICD-10-CM | POA: Diagnosis not present

## 2023-10-13 DIAGNOSIS — J45901 Unspecified asthma with (acute) exacerbation: Secondary | ICD-10-CM | POA: Diagnosis present

## 2023-10-13 DIAGNOSIS — J4531 Mild persistent asthma with (acute) exacerbation: Secondary | ICD-10-CM | POA: Diagnosis not present

## 2023-10-13 DIAGNOSIS — R069 Unspecified abnormalities of breathing: Secondary | ICD-10-CM | POA: Diagnosis not present

## 2023-10-13 LAB — CBC WITH DIFFERENTIAL/PLATELET
Abs Immature Granulocytes: 0.01 K/uL (ref 0.00–0.07)
Basophils Absolute: 0 K/uL (ref 0.0–0.1)
Basophils Relative: 0 %
Eosinophils Absolute: 0.2 K/uL (ref 0.0–0.5)
Eosinophils Relative: 3 %
HCT: 36.6 % — ABNORMAL LOW (ref 39.0–52.0)
Hemoglobin: 11.6 g/dL — ABNORMAL LOW (ref 13.0–17.0)
Immature Granulocytes: 0 %
Lymphocytes Relative: 18 %
Lymphs Abs: 1.1 K/uL (ref 0.7–4.0)
MCH: 28.3 pg (ref 26.0–34.0)
MCHC: 31.7 g/dL (ref 30.0–36.0)
MCV: 89.3 fL (ref 80.0–100.0)
Monocytes Absolute: 0.3 K/uL (ref 0.1–1.0)
Monocytes Relative: 4 %
Neutro Abs: 4.5 K/uL (ref 1.7–7.7)
Neutrophils Relative %: 75 %
Platelets: 230 K/uL (ref 150–400)
RBC: 4.1 MIL/uL — ABNORMAL LOW (ref 4.22–5.81)
RDW: 14.8 % (ref 11.5–15.5)
WBC: 6 K/uL (ref 4.0–10.5)
nRBC: 0 % (ref 0.0–0.2)

## 2023-10-13 LAB — CREATININE, SERUM
Creatinine, Ser: 1.02 mg/dL (ref 0.61–1.24)
GFR, Estimated: >60 mL/min (ref >60)

## 2023-10-13 LAB — RESP PANEL BY RT-PCR (RSV, FLU A&B, COVID)  RVPGX2
Influenza A by PCR: NEGATIVE
Influenza B by PCR: NEGATIVE
Resp Syncytial Virus by PCR: NEGATIVE
SARS Coronavirus 2 by RT PCR: NEGATIVE

## 2023-10-13 LAB — BASIC METABOLIC PANEL WITH GFR
Anion gap: 12 (ref 5–15)
BUN: 21 mg/dL (ref 8–23)
CO2: 25 mmol/L (ref 22–32)
Calcium: 9.4 mg/dL (ref 8.9–10.3)
Chloride: 104 mmol/L (ref 98–111)
Creatinine, Ser: 0.89 mg/dL (ref 0.61–1.24)
GFR, Estimated: >60 mL/min (ref >60)
Glucose, Bld: 150 mg/dL — ABNORMAL HIGH (ref 70–99)
Potassium: 3.5 mmol/L (ref 3.5–5.1)
Sodium: 141 mmol/L (ref 135–145)

## 2023-10-13 LAB — CBC
HCT: 35.4 % — ABNORMAL LOW (ref 39.0–52.0)
Hemoglobin: 11.5 g/dL — ABNORMAL LOW (ref 13.0–17.0)
MCH: 28.5 pg (ref 26.0–34.0)
MCHC: 32.5 g/dL (ref 30.0–36.0)
MCV: 87.6 fL (ref 80.0–100.0)
Platelets: 214 K/uL (ref 150–400)
RBC: 4.04 MIL/uL — ABNORMAL LOW (ref 4.22–5.81)
RDW: 14.8 % (ref 11.5–15.5)
WBC: 7.6 K/uL (ref 4.0–10.5)
nRBC: 0 % (ref 0.0–0.2)

## 2023-10-13 LAB — TROPONIN I (HIGH SENSITIVITY)
Troponin I (High Sensitivity): 6 ng/L (ref <18)
Troponin I (High Sensitivity): 16 ng/L (ref <18)

## 2023-10-13 LAB — BRAIN NATRIURETIC PEPTIDE: B Natriuretic Peptide: 21.7 pg/mL (ref 0.0–100.0)

## 2023-10-13 MED ORDER — ENOXAPARIN SODIUM 40 MG/0.4ML IJ SOSY
40.0000 mg | PREFILLED_SYRINGE | INTRAMUSCULAR | Status: DC
  Administered 2023-10-13 – 2023-10-14 (×2): 40 mg via SUBCUTANEOUS
  Filled 2023-10-13 (×2): qty 0.4

## 2023-10-13 MED ORDER — FLUTICASONE PROPIONATE HFA 220 MCG/ACT IN AERO: 1.0000 | INHALATION_SPRAY | Freq: Every day | RESPIRATORY_TRACT | Status: DC

## 2023-10-13 MED ORDER — PREDNISONE 20 MG PO TABS
40.0000 mg | ORAL_TABLET | Freq: Every day | ORAL | Status: DC
  Administered 2023-10-14 – 2023-10-15 (×2): 40 mg via ORAL
  Filled 2023-10-13 (×2): qty 2

## 2023-10-13 MED ORDER — GUAIFENESIN ER 600 MG PO TB12: 600.0000 mg | ORAL_TABLET | Freq: Two times a day (BID) | ORAL | Status: DC | PRN

## 2023-10-13 MED ORDER — HYDROCHLOROTHIAZIDE 12.5 MG PO TABS
12.5000 mg | ORAL_TABLET | Freq: Every day | ORAL | Status: DC
  Administered 2023-10-14 – 2023-10-15 (×2): 12.5 mg via ORAL
  Filled 2023-10-13 (×2): qty 1

## 2023-10-13 MED ORDER — BUDESONIDE 0.5 MG/2ML IN SUSP
0.5000 mg | Freq: Two times a day (BID) | RESPIRATORY_TRACT | Status: DC
Start: 2023-10-13 — End: 2023-10-15
  Administered 2023-10-13 – 2023-10-15 (×4): 0.5 mg via RESPIRATORY_TRACT
  Filled 2023-10-13 (×4): qty 2

## 2023-10-13 MED ORDER — IPRATROPIUM-ALBUTEROL 0.5-2.5 (3) MG/3ML IN SOLN: 3.0000 mL | Freq: Four times a day (QID) | RESPIRATORY_TRACT | Status: DC | PRN

## 2023-10-13 MED ORDER — AMLODIPINE BESYLATE 5 MG PO TABS
10.0000 mg | ORAL_TABLET | Freq: Every day | ORAL | Status: DC
  Administered 2023-10-14 – 2023-10-15 (×2): 10 mg via ORAL
  Filled 2023-10-13 (×2): qty 2

## 2023-10-13 NOTE — ED Provider Notes (Signed)
 [place] EMERGENCY DEPARTMENT AT [facility] Provider Note   CSN: [id number] Arrival date & time: 10/13/23  1433     Patient presents with: Shortness of Breath   [name] [name] is a 83 y.o. male.   Pt is a 83 yo male with pmhx significant for asthma, htn, and asthma.  Pt said he's been having to use his nebulizers a lot lately.  He woke up this am more sob.  He used several nebulizers today and they did not help, so he called EMS.  Pt's RA O2 sat was 84% upon EMS arrival.  EMS gave him 15 mg albuterol , 1 mg atrovent , 0.3 mg epi IM times 2, 2 g mg sulfate, and 125 mg solumedrol.  Pt said he is breathing easier now.  No fevers.  No known sick exposures.       Prior to Admission medications   Medication Sig Start Date End Date Taking? Authorizing Provider  albuterol  (PROAIR  HFA) 108 (90 Base) MCG/ACT inhaler Inhale 1-2 puffs into the lungs every 4 (four) hours as needed for wheezing or shortness of breath. 01/06/22   Alexander, Natalie, DO  amLODipine  (NORVASC ) 10 MG tablet Take 10 mg by mouth daily.    [provider]  azithromycin  (ZITHROMAX ) 250 MG tablet Take 1 tablet (250 mg total) by mouth daily. Take first 2 tablets together, then 1 every day until finished. 09/15/23   Rolinda Rogue, MD  calcium carbonate (TUMS - DOSED IN MG ELEMENTAL CALCIUM) 500 MG chewable tablet Chew 1 tablet by mouth daily as needed for indigestion or heartburn.    [provider]  fluticasone  (FLOVENT  HFA) 220 MCG/ACT inhaler Inhale 1 puff into the lungs daily. 01/06/22   Alexander, Natalie, DO  guaiFENesin  (MUCINEX ) 600 MG 12 hr tablet Take 1 tablet (600 mg total) by mouth 2 (two) times daily as needed for cough or to loosen phlegm. 01/06/22   Alexander, Natalie, DO  hydrochlorothiazide  (HYDRODIURIL ) 12.5 MG tablet Take 12.5 mg by mouth daily. 11/06/15   [provider]  ipratropium-albuterol  (DUONEB) 0.5-2.5 (3) MG/3ML SOLN Take 3 mLs by nebulization 2 (two) times daily  as needed (/wheezing not helped by albuterol  inhaler). 01/06/22   Alexander, Natalie, DO  potassium chloride  SA (KLOR-CON  M) 20 MEQ tablet Take one tablet twice a day for ten days, then take one tablet every day 12/18/21   Raford Lenis, MD    Allergies: Ace inhibitors and Azelastine hcl    Review of Systems  Respiratory:  Positive for cough, shortness of breath and wheezing.   All other systems reviewed and are negative.   Updated Vital Signs BP (!) 146/71   Pulse (!) 120   Temp 97.8 F (36.6 C) (Axillary)   Resp 15   Ht 5' 11 (1.803 m)   Wt 79.8 kg   SpO2 100%   BMI 24.55 kg/m   Physical Exam Vitals and nursing note reviewed.  Constitutional:      Appearance: He is well-developed.  HENT:     Head: Normocephalic and atraumatic.     Mouth/Throat:     Mouth: Mucous membranes are moist.     Pharynx: Oropharynx is clear.  Eyes:     Extraocular Movements: Extraocular movements intact.     Pupils: Pupils are equal, round, and reactive to light.  Cardiovascular:     Rate and Rhythm: Regular rhythm. Tachycardia present.  Pulmonary:     Effort: Tachypnea present.     Breath sounds: Wheezing present.  Abdominal:     General: Bowel sounds are normal.     Palpations: Abdomen is soft.  Musculoskeletal:        General: Normal range of motion.     Cervical back: Normal range of motion and neck supple.  Skin:    General: Skin is warm.     Capillary Refill: Capillary refill takes less than 2 seconds.  Neurological:     General: No focal deficit present.     Mental Status: He is alert and oriented to person, place, and time.  Psychiatric:        Mood and Affect: Mood normal.        Behavior: Behavior normal.     (all labs ordered are listed, but only abnormal results are displayed) Labs Reviewed  BASIC METABOLIC PANEL WITH GFR - Abnormal; Notable for the following components:      Result Value   Glucose, Bld 150 (*)    All other components within normal limits  CBC WITH  DIFFERENTIAL/PLATELET - Abnormal; Notable for the following components:   RBC 4.10 (*)    Hemoglobin 11.6 (*)    HCT 36.6 (*)    All other components within normal limits  RESP PANEL BY RT-PCR (RSV, FLU A&B, COVID)  RVPGX2  BRAIN NATRIURETIC PEPTIDE  CBC  CREATININE, SERUM  TROPONIN I (HIGH SENSITIVITY)  TROPONIN I (HIGH SENSITIVITY)    EKG: EKG Interpretation Date/Time:  Tuesday October 13 2023 15:16:34 EDT Ventricular Rate:  120 PR Interval:  142 QRS Duration:  92 QT Interval:  405 QTC Calculation: 573 R Axis:   67  Text Interpretation: Sinus tachycardia Atrial premature complexes Probable left atrial enlargement Prolonged QT interval No significant change since last tracing Confirmed by Dean Clarity [id number]) on 10/13/2023 4:27:33 PM  Radiology: [facility] Chest Port 1 View Result Date: 10/13/2023 CLINICAL DATA:  Shortness of breath. EXAM: PORTABLE CHEST 1 VIEW COMPARISON:  September 15, 2023. FINDINGS: The heart size and mediastinal contours are within normal limits. Both lungs are clear. The visualized skeletal structures are unremarkable. IMPRESSION: No active disease. Electronically Signed   By: Lynwood Landy Raddle M.D.   On: 10/13/2023 16:13     Procedures   Medications Ordered in the ED  enoxaparin  (LOVENOX ) injection 40 mg (has no administration in time range)  amLODipine  (NORVASC ) tablet 10 mg (has no administration in time range)  fluticasone  (FLOVENT  HFA) 220 MCG/ACT inhaler 1 puff (has no administration in time range)  guaiFENesin  (MUCINEX ) 12 hr tablet 600 mg (has no administration in time range)  hydrochlorothiazide  (HYDRODIURIL ) tablet 12.5 mg (has no administration in time range)  ipratropium-albuterol  (DUONEB) 0.5-2.5 (3) MG/3ML nebulizer solution 3 mL (has no administration in time range)  predniSONE  (DELTASONE ) tablet 40 mg (has no administration in time range)                                    Medical Decision Making Amount and/or Complexity of Data  Reviewed Labs: ordered. Radiology: ordered.   This patient presents to the ED for concern of sob, this involves an extensive number of treatment options, and is a complaint that carries with it a high risk of complications and morbidity.  The differential diagnosis includes asthma exac, covid/flu/rsv, pna   Co morbidities that complicate the patient evaluation  asthma, htn, and asthma   Additional history obtained:  Additional history obtained from epic chart review External records from  outside source obtained and reviewed including EMS report   Lab Tests:  I Ordered, and personally interpreted labs.  The pertinent results include:  bmp nl; cbc with hgb 11.6 (stable); trop nl; bnp nl; covid/flu/rsv neg   Imaging Studies ordered:  I ordered imaging studies including cxr  I independently visualized and interpreted imaging which showed No active disease.  I agree with the radiologist interpretation   Cardiac Monitoring:  The patient was maintained on a cardiac monitor.  I personally viewed and interpreted the cardiac monitored which showed an underlying rhythm of: st   Medicines ordered and prescription drug management:  I have reviewed the patients home medicines and have made adjustments as needed   Consultations Obtained:  I requested consultation with the hospitalist (Dr. Georgina),  and discussed lab and imaging findings as well as pertinent plan - he will admit   Problem List / ED Course:  Asthma exacerbation:  EMS gave the patient multiple meds to help him breathe and he is feeling better.  His RA O2 sat is now in the upper 90s.  He still feels sob with ambulation and since he's been using his nebs so much, I think he needs to come in at least overnight.     Reevaluation:  After the interventions noted above, I reevaluated the patient and found that they have :improved   Social Determinants of Health:  Lives at home   Dispostion:  After consideration  of the diagnostic results and the patients response to treatment, I feel that the patent would benefit from admission.       Final diagnoses:  Moderate persistent asthma with exacerbation    ED Discharge Orders     None          Dean Clarity, MD 10/13/23 [phone]

## 2023-10-13 NOTE — ED Triage Notes (Signed)
 Pt BIB [facility] c/o SOB that's worsened over the course of the day. Pt was 84% on RA upon EMS arrival and had already given himself 2-3 breathing treatments with no improvement. PMHx asthma, Afib, HTN. BGL 180. 20g IV L forearm.   En route EMS gave:  15mg  albuterol  1mg  Atrovent  0.3 epi IM x 2 2g Mag sulfate 125 solumedrol  Pt A/Ox4, vitally stable, and resting comfortably on arrival.

## 2023-10-13 NOTE — ED Notes (Addendum)
 Pt ambulated with a walker maintaining an oxygen saturation of 94-97% with mild shortness of breath with exertion. MD [place] aware.

## 2023-10-13 NOTE — H&P (Signed)
 History and Physical    Patient: [name] [name] FMW:[id number] DOB: [birth year] DOA: 10/13/2023 DOS: the patient was seen and examined on 10/13/2023 PCP: Elliot Charm, MD  Patient coming from: Home  Chief Complaint:  Chief Complaint  Patient presents with   Shortness of Breath   HPI: [name] [name] is a 83 y.o. male with medical history significant of asthma, seasonal allergies, and HTN p/w asthma exacerbation.  The patient reported the onset of shortness of breath today at around 1 o'clock in the afternoon while watching TV. The patient attempted to alleviate the symptoms by using a nebulizer, taking two treatments, but without relief. The patient was unable to take a third treatment and subsequently called 911. The patient mentioned a history of asthma for years and typically used a nebulizer at home about twice a day, which the patient believed might be too frequent. The patient had not seen many doctors for asthma over the years and did not use daily inhalers for asthma control, instead using the nebulizer on an emergency basis.  In the ED, pt tachycardic and initially tachypneic W/ hypoxia (O2 sat of 84 per EMS). Labs unremarkable. EDP requests admission for asthma exacerbation.   Review of Systems: As mentioned in the history of present illness. All other systems reviewed and are negative. Past Medical History:  Diagnosis Date   Asthma    Hypertension    Seasonal allergies    History reviewed. No pertinent surgical history. Social History:  reports that he has never smoked. He has never used smokeless tobacco. He reports that he does not drink alcohol and does not use drugs.  Allergies  Allergen Reactions   Ace Inhibitors Swelling    In the face   Azelastine Hcl Swelling    History reviewed. No pertinent family history.  Prior to Admission medications   Medication Sig Start Date End Date Taking? Authorizing Provider  albuterol  (PROAIR  HFA) 108 (90 Base)  MCG/ACT inhaler Inhale 1-2 puffs into the lungs every 4 (four) hours as needed for wheezing or shortness of breath. 01/06/22   Alexander, Natalie, DO  amLODipine  (NORVASC ) 10 MG tablet Take 10 mg by mouth daily.    [provider]  azithromycin  (ZITHROMAX ) 250 MG tablet Take 1 tablet (250 mg total) by mouth daily. Take first 2 tablets together, then 1 every day until finished. 09/15/23   Rolinda Rogue, MD  calcium carbonate (TUMS - DOSED IN MG ELEMENTAL CALCIUM) 500 MG chewable tablet Chew 1 tablet by mouth daily as needed for indigestion or heartburn.    [provider]  fluticasone  (FLOVENT  HFA) 220 MCG/ACT inhaler Inhale 1 puff into the lungs daily. 01/06/22   Alexander, Natalie, DO  guaiFENesin  (MUCINEX ) 600 MG 12 hr tablet Take 1 tablet (600 mg total) by mouth 2 (two) times daily as needed for cough or to loosen phlegm. 01/06/22   Alexander, Natalie, DO  hydrochlorothiazide  (HYDRODIURIL ) 12.5 MG tablet Take 12.5 mg by mouth daily. 11/06/15   [provider]  ipratropium-albuterol  (DUONEB) 0.5-2.5 (3) MG/3ML SOLN Take 3 mLs by nebulization 2 (two) times daily as needed (/wheezing not helped by albuterol  inhaler). 01/06/22   Alexander, Natalie, DO  potassium chloride  SA (KLOR-CON  M) 20 MEQ tablet Take one tablet twice a day for ten days, then take one tablet every day 12/18/21   Raford Lenis, MD    Physical Exam: Vitals:   10/13/23 1441 10/13/23 1446 10/13/23 1447 10/13/23 1515  BP:  130/78  (!) 146/71  Pulse: ROLLEN)  117 (!) 116  (!) 120  Resp: 20 14  15   Temp:  97.8 F (36.6 C)    TempSrc:  Axillary    SpO2:  100%  100%  Weight:   79.8 kg   Height:   5' 11 (1.803 m)    General: Alert, oriented x3, resting comfortably in no acute distress Respiratory: Diffuse wheezing Cardiovascular: Regular rate and rhythm w/o m/r/g   Data Reviewed:  Lab Results  Component Value Date   WBC 6.0 10/13/2023   HGB 11.6 (L) 10/13/2023   HCT 36.6 (L) 10/13/2023   MCV 89.3  10/13/2023   PLT 230 10/13/2023   Lab Results  Component Value Date   GLUCOSE 150 (H) 10/13/2023   CALCIUM 9.4 10/13/2023   NA 141 10/13/2023   K 3.5 10/13/2023   CO2 25 10/13/2023   CL 104 10/13/2023   BUN 21 10/13/2023   CREATININE 0.89 10/13/2023   Lab Results  Component Value Date   ALT 20 01/14/2018   AST 22 01/14/2018   ALKPHOS 144 (H) 01/14/2018   BILITOT 0.8 01/14/2018   No results found for: INR, PROTIME Radiology: [facility] Chest Port 1 View Result Date: 10/13/2023 CLINICAL DATA:  Shortness of breath. EXAM: PORTABLE CHEST 1 VIEW COMPARISON:  September 15, 2023. FINDINGS: The heart size and mediastinal contours are within normal limits. Both lungs are clear. The visualized skeletal structures are unremarkable. IMPRESSION: No active disease. Electronically Signed   By: Lynwood Landy Raddle M.D.   On: 10/13/2023 16:13    Assessment and Plan: 41M h/o asthma, seasonal allergies, and HTN p/w asthma exacerbation.  Asthma exacerbation -Prednisone  40mg  daily -Duonebs prn -Ambulatory pulse ox prior to d/c given low O2 sat reading per EMS -Consider OP Pulmonology referral at discharge given uncontrolled asthma  HTN -PTA amlodipine  10mg  daily and hydrochlorothiazide  12.5mg  daily   Advance Care Planning:   Code Status: Full Code   Consults: N/A  Family Communication: Spouse  Severity of Illness: The appropriate patient status for this patient is OBSERVATION. Observation status is judged to be reasonable and necessary in order to provide the required intensity of service to ensure the patient's safety. The patient's presenting symptoms, physical exam findings, and initial radiographic and laboratory data in the context of their medical condition is felt to place them at decreased risk for further clinical deterioration. Furthermore, it is anticipated that the patient will be medically stable for discharge from the hospital within 2 midnights of admission.    ------- I spent 57  minutes reviewing previous notes, at the bedside counseling/discussing the treatment plan, and performing clinical documentation.  Author: Marsha Ada, MD 10/13/2023 5:06 PM  For on call review [email or web address].

## 2023-10-14 DIAGNOSIS — I10 Essential (primary) hypertension: Secondary | ICD-10-CM | POA: Insufficient documentation

## 2023-10-14 DIAGNOSIS — J4541 Moderate persistent asthma with (acute) exacerbation: Secondary | ICD-10-CM | POA: Diagnosis not present

## 2023-10-14 DIAGNOSIS — J4531 Mild persistent asthma with (acute) exacerbation: Secondary | ICD-10-CM

## 2023-10-14 NOTE — Care Management Obs Status (Signed)
 MEDICARE OBSERVATION STATUS NOTIFICATION   Patient Details  Name: [name] [name] MRN: [id number] Date of Birth: 05-Jul-1940   Medicare Observation Status Notification Given:  Yes Verbally reviewed observation notice with Lamar Levy telephonically at [phone].  Will deliver to the room.    Dominica Kent 10/14/2023, 1:33 PM

## 2023-10-14 NOTE — Progress Notes (Signed)
 PROGRESS NOTE    [name] [name]  FMW:[id number] DOB: Feb 25, 1940 DOA: 10/13/2023 PCP: [name] Charm, MD     Brief Narrative:  [name] [name] is a 83 y.o. male with medical history significant of asthma, seasonal allergies, and HTN presents with asthma exacerbation.   The patient reported the onset of shortness of breath at around 1 o'clock in the afternoon while watching TV. The patient attempted to alleviate the symptoms by using a nebulizer, taking two treatments, but without relief. The patient was unable to take a third treatment and subsequently called 911. The patient mentioned a history of asthma for years and typically used a nebulizer at home about twice a day, which the patient believed might be too frequent.   In the ED, pt tachycardic and initially tachypneic W/ hypoxia (O2 sat of 84 per EMS). Labs unremarkable. EDP requests admission for asthma exacerbation.  New events last 24 hours / Subjective: Patient without conversational dyspnea today.  Does admit to shallow breathing, breathing does not feel back to his baseline.  Assessment & Plan:   Principal Problem:   Asthma exacerbation Active Problems:   HTN (hypertension)   Asthma exacerbation - Continue breathing treatments, prednisone .  He last saw pulmonology in 2017, will place pulmonology referral at time of discharge  Hypertension - Amlodipine , HCTZ  DVT prophylaxis:  enoxaparin  (LOVENOX ) injection 40 mg Start: 10/13/23 2200  Code Status: Full code Family Communication: None at bedside Disposition Plan: Home Status is: Observation The patient will require care spanning > 2 midnights and should be moved to inpatient because: Asthma exacerbation    Antimicrobials:  Anti-infectives (From admission, onward)    None        Objective: Vitals:   10/13/23 2001 10/13/23 2158 10/14/23 0519 10/14/23 0813  BP: 126/69  123/68 137/80  Pulse: 100  77 84  Resp:      Temp: 98.1 F (36.7 C)  97.6 F  (36.4 C) 98.1 F (36.7 C)  TempSrc:    Oral  SpO2: 100% 100% 100% 100%  Weight:      Height:       No intake or output data in the 24 hours ending 10/14/23 1024 Filed Weights   10/13/23 1447  Weight: 79.8 kg    Examination:  General exam: Appears calm and comfortable  Respiratory system: Bilateral expiratory wheezes.  Respiratory effort is normal without distress.  On room air Cardiovascular system: S1 & S2 heard, RRR. No murmurs. No pedal edema. Gastrointestinal system: Abdomen is nondistended, soft and nontender. Normal bowel sounds heard. Central nervous system: Alert and oriented. No focal neurological deficits. Speech clear.  Extremities: Symmetric in appearance  Skin: No rashes, lesions or ulcers on exposed skin  Psychiatry: Judgement and insight appear normal. Mood & affect appropriate.   Data Reviewed: I have personally reviewed following labs and imaging studies  CBC: Recent Labs  Lab 10/13/23 1521 10/13/23 1925  WBC 6.0 7.6  NEUTROABS 4.5  --   HGB 11.6* 11.5*  HCT 36.6* 35.4*  MCV 89.3 87.6  PLT 230 214   Basic Metabolic Panel: Recent Labs  Lab 10/13/23 1521 10/13/23 1925  NA 141  --   K 3.5  --   CL 104  --   CO2 25  --   GLUCOSE 150*  --   BUN 21  --   CREATININE 0.89 1.02  CALCIUM 9.4  --    GFR: Estimated Creatinine Clearance: 57.4 mL/min (by C-G formula based on SCr of 1.02  mg/dL). Liver Function Tests: No results for input(s): AST, ALT, ALKPHOS, BILITOT, PROT, ALBUMIN in the last 168 hours. No results for input(s): LIPASE, AMYLASE in the last 168 hours. No results for input(s): AMMONIA in the last 168 hours. Coagulation Profile: No results for input(s): INR, PROTIME in the last 168 hours. Cardiac Enzymes: No results for input(s): CKTOTAL, CKMB, CKMBINDEX, TROPONINI in the last 168 hours. BNP (last 3 results) No results for input(s): PROBNP in the last 8760 hours. HbA1C: No results for input(s): HGBA1C  in the last 72 hours. CBG: No results for input(s): GLUCAP in the last 168 hours. Lipid Profile: No results for input(s): CHOL, HDL, LDLCALC, TRIG, CHOLHDL, LDLDIRECT in the last 72 hours. Thyroid Function Tests: No results for input(s): TSH, T4TOTAL, FREET4, T3FREE, THYROIDAB in the last 72 hours. Anemia Panel: No results for input(s): VITAMINB12, FOLATE, FERRITIN, TIBC, IRON, RETICCTPCT in the last 72 hours. Sepsis Labs: No results for input(s): PROCALCITON, LATICACIDVEN in the last 168 hours.  Recent Results (from the past 240 hours)  Resp panel by RT-PCR (RSV, Flu A&B, Covid) Anterior Nasal Swab     Status: None   Collection Time: 10/13/23  3:21 PM   Specimen: Anterior Nasal Swab  Result Value Ref Range Status   SARS Coronavirus 2 by RT PCR NEGATIVE NEGATIVE Final   Influenza A by PCR NEGATIVE NEGATIVE Final   Influenza B by PCR NEGATIVE NEGATIVE Final    Comment: (NOTE) The Xpert Xpress SARS-CoV-2/FLU/RSV plus assay is intended as an aid in the diagnosis of influenza from Nasopharyngeal swab specimens and should not be used as a sole basis for treatment. Nasal washings and aspirates are unacceptable for Xpert Xpress SARS-CoV-2/FLU/RSV testing.  Fact Sheet for Patients: BloggerCourse.com  Fact Sheet for Healthcare Providers: SeriousBroker.it  This test is not yet approved or cleared by the [place]  FDA and has been authorized for detection and/or diagnosis of SARS-CoV-2 by FDA under an Emergency Use Authorization (EUA). This EUA will remain in effect (meaning this test can be used) for the duration of the COVID-19 declaration under Section 564(b)(1) of the Act, 21 U.S.C. section 360bbb-3(b)(1), unless the authorization is terminated or revoked.     Resp Syncytial Virus by PCR NEGATIVE NEGATIVE Final    Comment: (NOTE) Fact Sheet for  Patients: BloggerCourse.com  Fact Sheet for Healthcare Providers: SeriousBroker.it  This test is not yet approved or cleared by the [place]  FDA and has been authorized for detection and/or diagnosis of SARS-CoV-2 by FDA under an Emergency Use Authorization (EUA). This EUA will remain in effect (meaning this test can be used) for the duration of the COVID-19 declaration under Section 564(b)(1) of the Act, 21 U.S.C. section 360bbb-3(b)(1), unless the authorization is terminated or revoked.  Performed at [facility], [address], [place], [place] [address]       Radiology Studies: DG Chest Port 1 View Result Date: 10/13/2023 CLINICAL DATA:  Shortness of breath. EXAM: PORTABLE CHEST 1 VIEW COMPARISON:  September 15, 2023. FINDINGS: The heart size and mediastinal contours are within normal limits. Both lungs are clear. The visualized skeletal structures are unremarkable. IMPRESSION: No active disease. Electronically Signed   By: Lynwood Landy Raddle M.D.   On: 10/13/2023 16:13      Scheduled Meds:  amLODipine   10 mg Oral Daily   budesonide   0.5 mg Nebulization BID   enoxaparin  (LOVENOX ) injection  40 mg Subcutaneous Q24H   hydrochlorothiazide   12.5 mg Oral Daily   predniSONE   40 mg Oral Q breakfast   Continuous Infusions:   LOS: 0 days   Time spent: 25 minutes   Delon Hoe, DO [facility] 10/14/2023, 10:24 AM   Available via Epic secure chat 7am-7pm After these hours, please refer to coverage provider listed on [email or web address]

## 2023-10-14 NOTE — Progress Notes (Signed)
 Transition of Care [facility]) - Inpatient Brief Assessment   Patient Details  Name: [name] [name] MRN: [id number] Date of Birth: 05-23-1940  Transition of [facility]) CM/SW Contact:    Rosaline JONELLE Joe, RN Phone Number: 10/14/2023, 9:11 AM   Clinical Narrative: Patient admitted from home with asthma exacerbation.  No IP Care management needs at this time.   Transition of Care Asessment: Insurance and Status: (P) Insurance coverage has been reviewed Patient has primary care physician: (P) Yes Home environment has been reviewed: (P) from home with spouse Prior level of function:: (P) self Prior/Current Home Services: (P) No current home services Social Drivers of Health Review: (P) SDOH reviewed no interventions necessary Readmission risk has been reviewed: (P) Yes Transition of care needs: (P) no transition of care needs at this time

## 2023-10-14 NOTE — Plan of Care (Signed)

## 2023-10-15 DIAGNOSIS — J4541 Moderate persistent asthma with (acute) exacerbation: Secondary | ICD-10-CM | POA: Diagnosis not present

## 2023-10-15 DIAGNOSIS — J4531 Mild persistent asthma with (acute) exacerbation: Secondary | ICD-10-CM | POA: Diagnosis not present

## 2023-10-15 MED ORDER — INFLUENZA VAC SPLIT HIGH-DOSE 0.5 ML IM SUSY
0.5000 mL | PREFILLED_SYRINGE | INTRAMUSCULAR | Status: DC
Start: 2023-10-16 — End: 2023-10-15

## 2023-10-15 MED ORDER — PREDNISONE 20 MG PO TABS: 40.0000 mg | ORAL_TABLET | Freq: Every day | ORAL | 0 refills | Status: AC

## 2023-10-15 NOTE — Discharge Summary (Signed)
 Physician Discharge Summary  [name] [name] DOB: 26-Nov-1940 DOA: 10/13/2023  PCP: Elliot Charm, MD  Admit date: 10/13/2023 Discharge date: 10/15/2023  Admitted From: Home Disposition: Home  Recommendations for Outpatient Follow-up:  Follow up with PCP in 1 week Follow up with pulmonology, outpatient referral sent  Discharge Condition: Stable CODE STATUS: Full code Diet recommendation: Regular diet  Brief/Interim Summary: [name] [name] is a 83 y.o. male with medical history significant of asthma, seasonal allergies, and HTN presents with asthma exacerbation.   The patient reported the onset of shortness of breath at around 1 o'clock in the afternoon while watching TV. The patient attempted to alleviate the symptoms by using a nebulizer, taking two treatments, but without relief. The patient was unable to take a third treatment and subsequently called 911. The patient mentioned a history of asthma for years and typically used a nebulizer at home about twice a day, which the patient believed might be too frequent.    In the ED, pt tachycardic and initially tachypneic W/ hypoxia (O2 sat of 84 per EMS). Labs unremarkable. EDP requests admission for asthma exacerbation.  Patient was admitted for asthma exacerbation.  He received prednisone  as well as breathing treatment with improvement in his symptoms.  He was on room air and stable and symptoms improved at time of discharge.  Discharge Diagnoses:  Principal Problem:   Asthma exacerbation Active Problems:   HTN (hypertension)    Discharge Instructions  Discharge Instructions     Call MD for:  difficulty breathing, headache or visual disturbances   Complete by: As directed    Call MD for:  extreme fatigue   Complete by: As directed    Call MD for:  persistant dizziness or light-headedness   Complete by: As directed    Call MD for:  persistant nausea and vomiting   Complete by: As directed    Call MD  for:  severe uncontrolled pain   Complete by: As directed    Call MD for:  temperature >100.4   Complete by: As directed    Diet general   Complete by: As directed    Discharge instructions   Complete by: As directed    You were cared for by a hospitalist during your hospital stay. If you have any questions about your discharge medications or the care you received while you were in the hospital after you are discharged, you can call the unit and ask to speak with the hospitalist on call if the hospitalist that took care of you is not available. Once you are discharged, your primary care physician will handle any further medical issues. Please note that NO REFILLS for any discharge medications will be authorized once you are discharged, as it is imperative that you return to your primary care physician (or establish a relationship with a primary care physician if you do not have one) for your aftercare needs so that they can reassess your need for medications and monitor your lab values.   Increase activity slowly   Complete by: As directed    Pulmonary Visit   Complete by: As directed    Asthma   Reason for referral: Other Pulmonary      Allergies as of 10/15/2023       Reactions   Ace Inhibitors Swelling   In the face   Azelastine Hcl Swelling        Medication List     STOP taking these medications    azithromycin  250  MG tablet Commonly known as: ZITHROMAX        TAKE these medications    albuterol  108 (90 Base) MCG/ACT inhaler Commonly known as: ProAir  HFA Inhale 1-2 puffs into the lungs every 4 (four) hours as needed for wheezing or shortness of breath.   amLODipine  10 MG tablet Commonly known as: NORVASC  Take 10 mg by mouth daily.   calcium carbonate 500 MG chewable tablet Commonly known as: TUMS - dosed in mg elemental calcium Chew 1 tablet by mouth daily as needed for indigestion or heartburn.   guaiFENesin  600 MG 12 hr tablet Commonly known as:  MUCINEX  Take 1 tablet (600 mg total) by mouth 2 (two) times daily as needed for cough or to loosen phlegm.   hydrochlorothiazide  12.5 MG tablet Commonly known as: HYDRODIURIL  Take 12.5 mg by mouth daily.   ipratropium-albuterol  0.5-2.5 (3) MG/3ML Soln Commonly known as: DUONEB Take 3 mLs by nebulization 2 (two) times daily as needed (/wheezing not helped by albuterol  inhaler).   predniSONE  20 MG tablet Commonly known as: DELTASONE  Take 2 tablets (40 mg total) by mouth daily with breakfast for 3 days. Start taking on: October 16, 2023        Follow-up Information     Elliot Charm, MD Follow up.   Specialty: Internal Medicine Contact information: [address] [place] [address] [phone]         [facility] Follow up.   Specialty: Pulmonology Why: Outpatient referral sent to follow up regarding asthma Contact information: [address] [place]  [address] [phone] Additional information: [address]  [address]  [place], [place] [address]               Allergies  Allergen Reactions   Ace Inhibitors Swelling    In the face   Azelastine Hcl Swelling    Procedures/Studies: DG Chest Port 1 View Result Date: 10/13/2023 CLINICAL DATA:  Shortness of breath. EXAM: PORTABLE CHEST 1 VIEW COMPARISON:  September 15, 2023. FINDINGS: The heart size and mediastinal contours are within normal limits. Both lungs are clear. The visualized skeletal structures are unremarkable. IMPRESSION: No active disease. Electronically Signed   By: Lynwood Landy Raddle M.D.   On: 10/13/2023 16:13   DG Chest 2 View Result Date: 09/15/2023 EXAM: 2 VIEW(S) XRAY OF THE CHEST 09/15/2023 01:24:24 PM COMPARISON: None available. CLINICAL HISTORY: SOB. Pt c/o productive cough with yellow sputum, wheezing, and SOB x2wks. States has been using his nebulizer more often. Pt speaking in complete sentences. NAD.  Pt unable to stand straight up and could not raise up his arms; Pt held arms out in front of him for lateral; Best images obtainable. FINDINGS: LUNGS AND PLEURA: No focal pulmonary opacity. No pulmonary edema. No pleural effusion. No pneumothorax. HEART AND MEDIASTINUM: No acute abnormality of the cardiac and mediastinal silhouettes. BONES AND SOFT TISSUES: No acute fracture or destructive lesion. Multilevel degenerative disc disease of spine. Lateral view degraded by patient arm position, not raised above the head. IMPRESSION: 1. No acute process. Electronically signed by: Rockey Kilts MD 09/15/2023 02:15 PM EDT RP Workstation: [id number]      Discharge Exam: Vitals:   10/15/23 0820 10/15/23 0917  BP: 128/75   Pulse: 66 71  Resp:  18  Temp:    SpO2: 100% 98%    General: Pt is alert, awake, not in acute distress Cardiovascular: RRR, S1/S2 +, no edema Respiratory: CTA bilaterally, no  wheezing, no rhonchi, no respiratory distress, no conversational dyspnea, on room air  Abdominal: Soft, NT, ND, bowel sounds + Extremities: no edema, no cyanosis Psych: Normal mood and affect, stable judgement and insight     The results of significant diagnostics from this hospitalization (including imaging, microbiology, ancillary and laboratory) are listed below for reference.     Microbiology: Recent Results (from the past 240 hours)  Resp panel by RT-PCR (RSV, Flu A&B, Covid) Anterior Nasal Swab     Status: None   Collection Time: 10/13/23  3:21 PM   Specimen: Anterior Nasal Swab  Result Value Ref Range Status   SARS Coronavirus 2 by RT PCR NEGATIVE NEGATIVE Final   Influenza A by PCR NEGATIVE NEGATIVE Final   Influenza B by PCR NEGATIVE NEGATIVE Final    Comment: (NOTE) The Xpert Xpress SARS-CoV-2/FLU/RSV plus assay is intended as an aid in the diagnosis of influenza from Nasopharyngeal swab specimens and should not be used as a sole basis for treatment. Nasal washings and aspirates are  unacceptable for Xpert Xpress SARS-CoV-2/FLU/RSV testing.  Fact Sheet for Patients: BloggerCourse.com  Fact Sheet for Healthcare Providers: SeriousBroker.it  This test is not yet approved or cleared by the [place]  FDA and has been authorized for detection and/or diagnosis of SARS-CoV-2 by FDA under an Emergency Use Authorization (EUA). This EUA will remain in effect (meaning this test can be used) for the duration of the COVID-19 declaration under Section 564(b)(1) of the Act, 21 U.S.C. section 360bbb-3(b)(1), unless the authorization is terminated or revoked.     Resp Syncytial Virus by PCR NEGATIVE NEGATIVE Final    Comment: (NOTE) Fact Sheet for Patients: BloggerCourse.com  Fact Sheet for Healthcare Providers: SeriousBroker.it  This test is not yet approved or cleared by the [place]  FDA and has been authorized for detection and/or diagnosis of SARS-CoV-2 by FDA under an Emergency Use Authorization (EUA). This EUA will remain in effect (meaning this test can be used) for the duration of the COVID-19 declaration under Section 564(b)(1) of the Act, 21 U.S.C. section 360bbb-3(b)(1), unless the authorization is terminated or revoked.  Performed at [facility], [address], [place], [place] [address]      Labs: BNP (last 3 results) Recent Labs    10/13/23 1521  BNP 21.7   Basic Metabolic Panel: Recent Labs  Lab 10/13/23 1521 10/13/23 1925  NA 141  --   K 3.5  --   CL 104  --   CO2 25  --   GLUCOSE 150*  --   BUN 21  --   CREATININE 0.89 1.02  CALCIUM 9.4  --    Liver Function Tests: No results for input(s): AST, ALT, ALKPHOS, BILITOT, PROT, ALBUMIN in the last 168 hours. No results for input(s): LIPASE, AMYLASE in the last 168 hours. No results for input(s): AMMONIA in the last 168 hours. CBC: Recent Labs  Lab  10/13/23 1521 10/13/23 1925  WBC 6.0 7.6  NEUTROABS 4.5  --   HGB 11.6* 11.5*  HCT 36.6* 35.4*  MCV 89.3 87.6  PLT 230 214   Cardiac Enzymes: No results for input(s): CKTOTAL, CKMB, CKMBINDEX, TROPONINI in the last 168 hours. BNP: Invalid input(s): POCBNP CBG: No results for input(s): GLUCAP in the last 168 hours. D-Dimer No results for input(s): DDIMER in the last 72 hours. Hgb A1c No results for input(s): HGBA1C in the last 72 hours. Lipid Profile No results for input(s): CHOL, HDL, LDLCALC, TRIG, CHOLHDL, LDLDIRECT in  the last 72 hours. Thyroid function studies No results for input(s): TSH, T4TOTAL, T3FREE, THYROIDAB in the last 72 hours.  Invalid input(s): FREET3 Anemia work up No results for input(s): VITAMINB12, FOLATE, FERRITIN, TIBC, IRON, RETICCTPCT in the last 72 hours. Urinalysis    Component Value Date/Time   COLORURINE YELLOW 01/14/2018 2100   APPEARANCEUR CLEAR 01/14/2018 2100   LABSPEC 1.017 01/14/2018 2100   PHURINE 6.0 01/14/2018 2100   GLUCOSEU NEGATIVE 01/14/2018 2100   HGBUR NEGATIVE 01/14/2018 2100   BILIRUBINUR NEGATIVE 01/14/2018 2100   KETONESUR NEGATIVE 01/14/2018 2100   PROTEINUR NEGATIVE 01/14/2018 2100   UROBILINOGEN 1.0 10/19/2010 0821   NITRITE NEGATIVE 01/14/2018 2100   LEUKOCYTESUR MODERATE (A) 01/14/2018 2100   Sepsis Labs Recent Labs  Lab 10/13/23 1521 10/13/23 1925  WBC 6.0 7.6   Microbiology Recent Results (from the past 240 hours)  Resp panel by RT-PCR (RSV, Flu A&B, Covid) Anterior Nasal Swab     Status: None   Collection Time: 10/13/23  3:21 PM   Specimen: Anterior Nasal Swab  Result Value Ref Range Status   SARS Coronavirus 2 by RT PCR NEGATIVE NEGATIVE Final   Influenza A by PCR NEGATIVE NEGATIVE Final   Influenza B by PCR NEGATIVE NEGATIVE Final    Comment: (NOTE) The Xpert Xpress SARS-CoV-2/FLU/RSV plus assay is intended as an aid in the diagnosis of influenza  from Nasopharyngeal swab specimens and should not be used as a sole basis for treatment. Nasal washings and aspirates are unacceptable for Xpert Xpress SARS-CoV-2/FLU/RSV testing.  Fact Sheet for Patients: BloggerCourse.com  Fact Sheet for Healthcare Providers: SeriousBroker.it  This test is not yet approved or cleared by the [place]  FDA and has been authorized for detection and/or diagnosis of SARS-CoV-2 by FDA under an Emergency Use Authorization (EUA). This EUA will remain in effect (meaning this test can be used) for the duration of the COVID-19 declaration under Section 564(b)(1) of the Act, 21 U.S.C. section 360bbb-3(b)(1), unless the authorization is terminated or revoked.     Resp Syncytial Virus by PCR NEGATIVE NEGATIVE Final    Comment: (NOTE) Fact Sheet for Patients: BloggerCourse.com  Fact Sheet for Healthcare Providers: SeriousBroker.it  This test is not yet approved or cleared by the [place]  FDA and has been authorized for detection and/or diagnosis of SARS-CoV-2 by FDA under an Emergency Use Authorization (EUA). This EUA will remain in effect (meaning this test can be used) for the duration of the COVID-19 declaration under Section 564(b)(1) of the Act, 21 U.S.C. section 360bbb-3(b)(1), unless the authorization is terminated or revoked.  Performed at [facility], [address] [place], [place] [address]      Patient was seen and examined on the day of discharge and was found to be in stable condition. Time coordinating discharge: 25 minutes including assessment and coordination of care, as well as examination of the patient.   SIGNED:  Delon Hoe, DO [facility] 10/15/2023, 12:21 PM

## 2023-10-15 NOTE — Progress Notes (Signed)
   10/14/23 1520  Spiritual Encounters  Type of Visit Initial  Care provided to: Pt and family  Referral source Patient request  Reason for visit Advance directives  Spiritual Framework  Presenting Themes Values and beliefs  Community/Connection Significant other;Family  Spiritual Care Plan  Spiritual Care Issues Still Outstanding No further spiritual care needs at this time (see row info)  Advance Directives (For Healthcare)  Does Patient Have a Medical Advance Directive? No  Would patient like information on creating a medical advance directive? Yes (Inpatient - patient defers creating a medical advance directive at this time - Information given)   Went over AD with Pt and Spouse. Left AD with Pt. Pt said they would follow up with further questions if needed.

## 2023-10-15 NOTE — Plan of Care (Signed)

## 2023-10-23 DIAGNOSIS — I10 Essential (primary) hypertension: Secondary | ICD-10-CM | POA: Diagnosis not present

## 2023-10-23 DIAGNOSIS — E440 Moderate protein-calorie malnutrition: Secondary | ICD-10-CM | POA: Diagnosis not present

## 2023-10-23 DIAGNOSIS — J45901 Unspecified asthma with (acute) exacerbation: Secondary | ICD-10-CM | POA: Diagnosis not present

## 2023-10-23 DIAGNOSIS — Z23 Encounter for immunization: Secondary | ICD-10-CM | POA: Diagnosis not present

## 2023-12-24 ENCOUNTER — Emergency Department (HOSPITAL_COMMUNITY)

## 2023-12-24 ENCOUNTER — Inpatient Hospital Stay (HOSPITAL_COMMUNITY)

## 2023-12-24 ENCOUNTER — Encounter (HOSPITAL_COMMUNITY): Payer: Self-pay

## 2023-12-24 ENCOUNTER — Inpatient Hospital Stay (HOSPITAL_COMMUNITY)
Admission: EM | Admit: 2023-12-24 | Discharge: 2024-01-14 | DRG: 208 | Disposition: E | Attending: Pulmonary Disease | Admitting: Pulmonary Disease

## 2023-12-24 DIAGNOSIS — R918 Other nonspecific abnormal finding of lung field: Secondary | ICD-10-CM | POA: Diagnosis not present

## 2023-12-24 DIAGNOSIS — R579 Shock, unspecified: Secondary | ICD-10-CM

## 2023-12-24 DIAGNOSIS — M4802 Spinal stenosis, cervical region: Secondary | ICD-10-CM | POA: Diagnosis not present

## 2023-12-24 DIAGNOSIS — R069 Unspecified abnormalities of breathing: Secondary | ICD-10-CM | POA: Diagnosis not present

## 2023-12-24 DIAGNOSIS — R0689 Other abnormalities of breathing: Secondary | ICD-10-CM | POA: Diagnosis not present

## 2023-12-24 DIAGNOSIS — Z515 Encounter for palliative care: Secondary | ICD-10-CM | POA: Diagnosis not present

## 2023-12-24 DIAGNOSIS — R7401 Elevation of levels of liver transaminase levels: Secondary | ICD-10-CM | POA: Diagnosis not present

## 2023-12-24 DIAGNOSIS — E785 Hyperlipidemia, unspecified: Secondary | ICD-10-CM | POA: Diagnosis present

## 2023-12-24 DIAGNOSIS — Z6824 Body mass index (BMI) 24.0-24.9, adult: Secondary | ICD-10-CM | POA: Diagnosis not present

## 2023-12-24 DIAGNOSIS — I469 Cardiac arrest, cause unspecified: Secondary | ICD-10-CM | POA: Diagnosis present

## 2023-12-24 DIAGNOSIS — I468 Cardiac arrest due to other underlying condition: Secondary | ICD-10-CM | POA: Diagnosis present

## 2023-12-24 DIAGNOSIS — R0602 Shortness of breath: Secondary | ICD-10-CM | POA: Diagnosis not present

## 2023-12-24 DIAGNOSIS — K7200 Acute and subacute hepatic failure without coma: Secondary | ICD-10-CM | POA: Diagnosis present

## 2023-12-24 DIAGNOSIS — I428 Other cardiomyopathies: Secondary | ICD-10-CM | POA: Diagnosis not present

## 2023-12-24 DIAGNOSIS — I499 Cardiac arrhythmia, unspecified: Secondary | ICD-10-CM | POA: Diagnosis not present

## 2023-12-24 DIAGNOSIS — E872 Acidosis, unspecified: Secondary | ICD-10-CM | POA: Diagnosis present

## 2023-12-24 DIAGNOSIS — R404 Transient alteration of awareness: Secondary | ICD-10-CM | POA: Diagnosis not present

## 2023-12-24 DIAGNOSIS — I10 Essential (primary) hypertension: Secondary | ICD-10-CM | POA: Diagnosis present

## 2023-12-24 DIAGNOSIS — R0603 Acute respiratory distress: Principal | ICD-10-CM

## 2023-12-24 DIAGNOSIS — M5033 Other cervical disc degeneration, cervicothoracic region: Secondary | ICD-10-CM | POA: Diagnosis not present

## 2023-12-24 DIAGNOSIS — E43 Unspecified severe protein-calorie malnutrition: Secondary | ICD-10-CM | POA: Diagnosis present

## 2023-12-24 DIAGNOSIS — R569 Unspecified convulsions: Secondary | ICD-10-CM | POA: Diagnosis not present

## 2023-12-24 DIAGNOSIS — J690 Pneumonitis due to inhalation of food and vomit: Secondary | ICD-10-CM | POA: Diagnosis present

## 2023-12-24 DIAGNOSIS — G9340 Encephalopathy, unspecified: Secondary | ICD-10-CM | POA: Diagnosis not present

## 2023-12-24 DIAGNOSIS — M47812 Spondylosis without myelopathy or radiculopathy, cervical region: Secondary | ICD-10-CM | POA: Diagnosis not present

## 2023-12-24 DIAGNOSIS — Z79899 Other long term (current) drug therapy: Secondary | ICD-10-CM | POA: Diagnosis not present

## 2023-12-24 DIAGNOSIS — G9349 Other encephalopathy: Secondary | ICD-10-CM | POA: Diagnosis present

## 2023-12-24 DIAGNOSIS — D649 Anemia, unspecified: Secondary | ICD-10-CM | POA: Diagnosis present

## 2023-12-24 DIAGNOSIS — I214 Non-ST elevation (NSTEMI) myocardial infarction: Secondary | ICD-10-CM | POA: Diagnosis not present

## 2023-12-24 DIAGNOSIS — I21A1 Myocardial infarction type 2: Secondary | ICD-10-CM | POA: Diagnosis present

## 2023-12-24 DIAGNOSIS — Z66 Do not resuscitate: Secondary | ICD-10-CM | POA: Diagnosis present

## 2023-12-24 DIAGNOSIS — Z888 Allergy status to other drugs, medicaments and biological substances status: Secondary | ICD-10-CM | POA: Diagnosis not present

## 2023-12-24 DIAGNOSIS — J9601 Acute respiratory failure with hypoxia: Secondary | ICD-10-CM | POA: Diagnosis present

## 2023-12-24 DIAGNOSIS — J4551 Severe persistent asthma with (acute) exacerbation: Secondary | ICD-10-CM | POA: Diagnosis present

## 2023-12-24 DIAGNOSIS — R680 Hypothermia, not associated with low environmental temperature: Secondary | ICD-10-CM | POA: Diagnosis present

## 2023-12-24 DIAGNOSIS — J45901 Unspecified asthma with (acute) exacerbation: Secondary | ICD-10-CM | POA: Diagnosis not present

## 2023-12-24 DIAGNOSIS — M96A3 Multiple fractures of ribs associated with chest compression and cardiopulmonary resuscitation: Secondary | ICD-10-CM | POA: Diagnosis present

## 2023-12-24 DIAGNOSIS — R000 Tachycardia, unspecified: Secondary | ICD-10-CM | POA: Diagnosis present

## 2023-12-24 DIAGNOSIS — R4182 Altered mental status, unspecified: Secondary | ICD-10-CM | POA: Diagnosis not present

## 2023-12-24 LAB — TROPONIN I (HIGH SENSITIVITY)
Troponin I (High Sensitivity): 23 ng/L — ABNORMAL HIGH (ref <18)
Troponin I (High Sensitivity): 157 ng/L (ref <18)

## 2023-12-24 LAB — I-STAT ARTERIAL BLOOD GAS, ED
Acid-Base Excess: 0 mmol/L (ref 0.0–2.0)
Bicarbonate: 24.9 mmol/L (ref 20.0–28.0)
Calcium, Ion: 1.13 mmol/L — ABNORMAL LOW (ref 1.15–1.40)
HCT: 37 % — ABNORMAL LOW (ref 39.0–52.0)
Hemoglobin: 12.6 g/dL — ABNORMAL LOW (ref 13.0–17.0)
O2 Saturation: 100 %
Potassium: 3.4 mmol/L — ABNORMAL LOW (ref 3.5–5.1)
Sodium: 139 mmol/L (ref 135–145)
TCO2: 26 mmol/L (ref 22–32)
pCO2 arterial: 42.1 mmHg (ref 32–48)
pH, Arterial: 7.379 (ref 7.35–7.45)
pO2, Arterial: 612 mmHg — ABNORMAL HIGH (ref 83–108)

## 2023-12-24 LAB — CBC
HCT: 33.1 % — ABNORMAL LOW (ref 39.0–52.0)
Hemoglobin: 10.8 g/dL — ABNORMAL LOW (ref 13.0–17.0)
MCH: 29 pg (ref 26.0–34.0)
MCHC: 32.6 g/dL (ref 30.0–36.0)
MCV: 89 fL (ref 80.0–100.0)
Platelets: 187 K/uL (ref 150–400)
RBC: 3.72 MIL/uL — ABNORMAL LOW (ref 4.22–5.81)
RDW: 14.6 % (ref 11.5–15.5)
WBC: 8.6 K/uL (ref 4.0–10.5)
nRBC: 0 % (ref 0.0–0.2)

## 2023-12-24 LAB — COMPREHENSIVE METABOLIC PANEL WITH GFR
ALT: 294 U/L — ABNORMAL HIGH (ref 0–44)
AST: 237 U/L — ABNORMAL HIGH (ref 15–41)
Albumin: 3.5 g/dL (ref 3.5–5.0)
Alkaline Phosphatase: 81 U/L (ref 38–126)
Anion gap: 11 (ref 5–15)
BUN: 14 mg/dL (ref 8–23)
CO2: 25 mmol/L (ref 22–32)
Calcium: 8.4 mg/dL — ABNORMAL LOW (ref 8.9–10.3)
Chloride: 103 mmol/L (ref 98–111)
Creatinine, Ser: 1.07 mg/dL (ref 0.61–1.24)
GFR, Estimated: >60 mL/min (ref >60)
Glucose, Bld: 124 mg/dL — ABNORMAL HIGH (ref 70–99)
Potassium: 4.1 mmol/L (ref 3.5–5.1)
Sodium: 139 mmol/L (ref 135–145)
Total Bilirubin: 1.5 mg/dL — ABNORMAL HIGH (ref 0.0–1.2)
Total Protein: 6 g/dL — ABNORMAL LOW (ref 6.5–8.1)

## 2023-12-24 LAB — I-STAT VENOUS BLOOD GAS, ED
Acid-Base Excess: 1 mmol/L (ref 0.0–2.0)
Bicarbonate: 25.3 mmol/L (ref 20.0–28.0)
Calcium, Ion: 1.09 mmol/L — ABNORMAL LOW (ref 1.15–1.40)
HCT: 33 % — ABNORMAL LOW (ref 39.0–52.0)
Hemoglobin: 11.2 g/dL — ABNORMAL LOW (ref 13.0–17.0)
O2 Saturation: 99 %
Potassium: 4.1 mmol/L (ref 3.5–5.1)
Sodium: 139 mmol/L (ref 135–145)
TCO2: 26 mmol/L (ref 22–32)
pCO2, Ven: 38.5 mmHg — ABNORMAL LOW (ref 44–60)
pH, Ven: 7.425 (ref 7.25–7.43)
pO2, Ven: 113 mmHg — ABNORMAL HIGH (ref 32–45)

## 2023-12-24 LAB — ECHOCARDIOGRAM COMPLETE
Area-P 1/2: 5.27 cm2
Calc EF: 58.7 %
MV VTI: 2.19 cm2
S' Lateral: 3.2 cm
Single Plane A2C EF: 53.8 %
Single Plane A4C EF: 64.7 %
Weight: 2786.61 [oz_av]

## 2023-12-24 LAB — I-STAT CHEM 8, ED
BUN: 17 mg/dL (ref 8–23)
Calcium, Ion: 1.09 mmol/L — ABNORMAL LOW (ref 1.15–1.40)
Chloride: 103 mmol/L (ref 98–111)
Creatinine, Ser: 1.2 mg/dL (ref 0.61–1.24)
Glucose, Bld: 123 mg/dL — ABNORMAL HIGH (ref 70–99)
HCT: 33 % — ABNORMAL LOW (ref 39.0–52.0)
Hemoglobin: 11.2 g/dL — ABNORMAL LOW (ref 13.0–17.0)
Potassium: 4.1 mmol/L (ref 3.5–5.1)
Sodium: 140 mmol/L (ref 135–145)
TCO2: 24 mmol/L (ref 22–32)

## 2023-12-24 LAB — RAPID URINE DRUG SCREEN, HOSP PERFORMED
Amphetamines: NOT DETECTED
Barbiturates: NOT DETECTED
Benzodiazepines: POSITIVE — AB
Cocaine: NOT DETECTED
Opiates: NOT DETECTED
Tetrahydrocannabinol: NOT DETECTED

## 2023-12-24 LAB — GLUCOSE, CAPILLARY
Glucose-Capillary: 110 mg/dL — ABNORMAL HIGH (ref 70–99)
Glucose-Capillary: 110 mg/dL — ABNORMAL HIGH (ref 70–99)
Glucose-Capillary: 123 mg/dL — ABNORMAL HIGH (ref 70–99)

## 2023-12-24 LAB — MRSA NEXT GEN BY PCR, NASAL: MRSA by PCR Next Gen: NOT DETECTED

## 2023-12-24 LAB — I-STAT CG4 LACTIC ACID, ED
Lactic Acid, Venous: 3.1 mmol/L (ref 0.5–1.9)
Lactic Acid, Venous: 2 mmol/L (ref 0.5–1.9)

## 2023-12-24 LAB — PHOSPHORUS: Phosphorus: 4 mg/dL (ref 2.5–4.6)

## 2023-12-24 LAB — BRAIN NATRIURETIC PEPTIDE: B Natriuretic Peptide: 18.7 pg/mL (ref 0.0–100.0)

## 2023-12-24 LAB — CBG MONITORING, ED: Glucose-Capillary: 103 mg/dL — ABNORMAL HIGH (ref 70–99)

## 2023-12-24 LAB — PROTIME-INR
INR: 1.2 (ref 0.8–1.2)
Prothrombin Time: 16 s — ABNORMAL HIGH (ref 11.4–15.2)

## 2023-12-24 LAB — APTT: aPTT: 32 s (ref 24–36)

## 2023-12-24 LAB — MAGNESIUM: Magnesium: 1.9 mg/dL (ref 1.7–2.4)

## 2023-12-24 MED ORDER — SODIUM CHLORIDE 0.9 % IV SOLN: INTRAVENOUS | Status: AC | PRN

## 2023-12-24 MED ORDER — SODIUM CHLORIDE 0.9 % IV SOLN: 250.0000 mL | INTRAVENOUS | Status: AC

## 2023-12-24 MED ORDER — CHLORHEXIDINE GLUCONATE CLOTH 2 % EX PADS
6.0000 | MEDICATED_PAD | Freq: Every day | CUTANEOUS | Status: DC
  Administered 2023-12-25 – 2023-12-26 (×3): 6 via TOPICAL

## 2023-12-24 MED ORDER — FENTANYL CITRATE (PF) 50 MCG/ML IJ SOSY
PREFILLED_SYRINGE | INTRAMUSCULAR | Status: AC
  Filled 2023-12-24: qty 1

## 2023-12-24 MED ORDER — NOREPINEPHRINE 4 MG/250ML-% IV SOLN
0.0000 ug/min | INTRAVENOUS | Status: DC
  Administered 2023-12-24: 5 ug/min via INTRAVENOUS
  Filled 2023-12-24: qty 250

## 2023-12-24 MED ORDER — DOCUSATE SODIUM 50 MG/5ML PO LIQD
100.0000 mg | Freq: Two times a day (BID) | ORAL | Status: DC
  Administered 2023-12-24 – 2023-12-27 (×6): 100 mg
  Filled 2023-12-24 (×6): qty 10

## 2023-12-24 MED ORDER — FENTANYL BOLUS VIA INFUSION
25.0000 ug | INTRAVENOUS | Status: DC | PRN
  Administered 2023-12-25 (×2): 50 ug via INTRAVENOUS
  Administered 2023-12-26 – 2023-12-27 (×4): 100 ug via INTRAVENOUS
  Filled 2023-12-24: qty 100

## 2023-12-24 MED ORDER — IOHEXOL 350 MG/ML SOLN
75.0000 mL | Freq: Once | INTRAVENOUS | Status: AC | PRN
  Administered 2023-12-24: 75 mL via INTRAVENOUS

## 2023-12-24 MED ORDER — LACTATED RINGERS IV BOLUS
1000.0000 mL | Freq: Once | INTRAVENOUS | Status: AC
  Administered 2023-12-24: 1000 mL via INTRAVENOUS

## 2023-12-24 MED ORDER — ORAL CARE MOUTH RINSE
15.0000 mL | OROMUCOSAL | Status: DC
  Administered 2023-12-24 – 2023-12-27 (×31): 15 mL via OROMUCOSAL

## 2023-12-24 MED ORDER — VANCOMYCIN HCL 1750 MG/350ML IV SOLN
1750.0000 mg | Freq: Once | INTRAVENOUS | Status: AC
  Administered 2023-12-24: 1750 mg via INTRAVENOUS
  Filled 2023-12-24: qty 350

## 2023-12-24 MED ORDER — PROPOFOL 1000 MG/100ML IV EMUL
0.0000 ug/kg/min | INTRAVENOUS | Status: DC
  Administered 2023-12-24: 5 ug/kg/min via INTRAVENOUS
  Administered 2023-12-24: 25 ug/kg/min via INTRAVENOUS
  Administered 2023-12-25: 35 ug/kg/min via INTRAVENOUS
  Administered 2023-12-26: 30 ug/kg/min via INTRAVENOUS
  Filled 2023-12-24 (×6): qty 100

## 2023-12-24 MED ORDER — VANCOMYCIN HCL IN DEXTROSE 1-5 GM/200ML-% IV SOLN: 1000.0000 mg | Freq: Once | INTRAVENOUS | Status: DC

## 2023-12-24 MED ORDER — ORAL CARE MOUTH RINSE: 15.0000 mL | OROMUCOSAL | Status: DC | PRN

## 2023-12-24 MED ORDER — FENTANYL 2500MCG IN NS 250ML (10MCG/ML) PREMIX INFUSION
0.0000 ug/h | INTRAVENOUS | Status: DC
  Administered 2023-12-24: 150 ug/h via INTRAVENOUS
  Administered 2023-12-24: 50 ug/h via INTRAVENOUS
  Administered 2023-12-25: 175 ug/h via INTRAVENOUS
  Administered 2023-12-26: 50 ug/h via INTRAVENOUS
  Filled 2023-12-24 (×4): qty 250

## 2023-12-24 MED ORDER — POLYETHYLENE GLYCOL 3350 17 G PO PACK
17.0000 g | PACK | Freq: Every day | ORAL | Status: DC | PRN
  Administered 2023-12-25: 17 g via ORAL

## 2023-12-24 MED ORDER — DOCUSATE SODIUM 50 MG/5ML PO LIQD: 100.0000 mg | Freq: Two times a day (BID) | ORAL | Status: DC | PRN

## 2023-12-24 MED ORDER — FENTANYL CITRATE (PF) 50 MCG/ML IJ SOSY
25.0000 ug | PREFILLED_SYRINGE | Freq: Once | INTRAMUSCULAR | Status: AC
  Administered 2023-12-24: 50 ug via INTRAVENOUS

## 2023-12-24 MED ORDER — SODIUM CHLORIDE 0.9 % IV SOLN
2.0000 g | Freq: Once | INTRAVENOUS | Status: AC
  Administered 2023-12-24: 2 g via INTRAVENOUS
  Filled 2023-12-24: qty 12.5

## 2023-12-24 MED ORDER — POLYETHYLENE GLYCOL 3350 17 G PO PACK
17.0000 g | PACK | Freq: Every day | ORAL | Status: DC
  Administered 2023-12-25 – 2023-12-27 (×3): 17 g
  Filled 2023-12-24 (×3): qty 1

## 2023-12-24 MED ORDER — FAMOTIDINE 20 MG PO TABS
20.0000 mg | ORAL_TABLET | Freq: Two times a day (BID) | ORAL | Status: DC
  Administered 2023-12-24 – 2023-12-25 (×2): 20 mg
  Filled 2023-12-24 (×2): qty 1

## 2023-12-24 NOTE — Progress Notes (Signed)
 Patient transported to CT and back to trauma room B on ventilator without complications.

## 2023-12-24 NOTE — ED Notes (Signed)
 Got patient on the monitor did EKG shown to Dr Yolande

## 2023-12-24 NOTE — ED Notes (Signed)
 ECHO at bedside.

## 2023-12-24 NOTE — ED Notes (Signed)
 X-ray at bedside.

## 2023-12-24 NOTE — ED Notes (Signed)
 EDP [place] inserting [employer] at this time.

## 2023-12-24 NOTE — Progress Notes (Signed)
 Escorted family to consultation room for doctor to brief concerning patient. Pt experienced Post CPR.  Chaplain provided emotional and spiritual support as needed.  Rayleen Dade, [place], [facility], Pager [phone]

## 2023-12-24 NOTE — Procedures (Signed)
 RT Art Line Insertion  Date/Time: 12/24/2023 2:05 PM  Performed by: Con Leotis NOVAK, RRT Authorized by: Jude Harden GAILS, MD  Consent: The procedure was performed in an emergent situation Time out: Immediately prior to procedure a time out was called to verify the correct patient, procedure, equipment, support staff and site/side marked as required. Preparation: Patient was prepped and draped in the usual sterile fashion. Patient tolerance: patient tolerated the procedure well with no immediate complications

## 2023-12-24 NOTE — Progress Notes (Signed)
 IO removed without complication, vaseline gauze, gauze and tape applied to site. Scant amount of bleeding noted at removal. Primary nurse aware.

## 2023-12-24 NOTE — Progress Notes (Signed)
 eLink Physician-Brief Progress Note Patient Name: [name] [name] DOB: May 28, 1940 MRN: [id number]   Date of Service  12/24/2023  HPI/Events of Note  CT C-Spine is negative.  eICU Interventions  Discontinue C-Collar.        [name] [name] 12/24/2023, 8:49 PM

## 2023-12-24 NOTE — Progress Notes (Signed)
 Patient transported from ED to room 2M03, on ventilator, without any complications.

## 2023-12-24 NOTE — H&P (Signed)
 NAME:  [name] [name], MRN:  [id number], DOB:  1940/05/19, LOS: 0 ADMISSION DATE:  12/24/2023, CONSULTATION DATE:  12/24/23 REFERRING MD:  Dr. Yolande, CHIEF COMPLAINT:  cardiac arrest   History of Present Illness:  HPI obtained from EMR review as patient is encephalopathic.   5 yoM with PMH significant for never smoker, asthma, HTN, HLD, malnutrition, seasonal allergies who presented by EMS after cardiac arrest.  Per chart, pt had called 911 around 1118 himself c/o difficulty breathing but stopped responding to them.  Found by fire department slumped over in chair with nebulizer going, CPR initiated at 1125, found to be in PEA with ROSC obtained at 1132 after 1 epi.  On ER arrival, SBP in 70's and started on vasopressor support.    Labs noted for   Pertinent  Medical History   Past Medical History:  Diagnosis Date   Asthma    Hypertension    Seasonal allergies    Significant Hospital Events: Including procedures, antibiotic start and stop dates in addition to other pertinent events   12/10 OOH cardiac arrest felt secondary to a respiratory arrest   Interim History / Subjective:  Intubated   Objective    Blood pressure (!) 158/84, resp. rate 19.       No intake or output data in the 24 hours ending 12/24/23 1303 There were no vitals filed for this visit.  Examination: General: Acute on chronically ill appearing elderly male lying in bed on mechanical ventilation, in NAD HEENT: ETT, MM pink/moist, PERRL,  Neuro: Unresponsive on vent  CV: s1s2 regular rate and rhythm, no murmur, rubs, or gallops,  PULM:  Clear to auscultation, no increased work of breathing, no added breath sounds  GI: soft, bowel sounds active in all 4 quadrants, non-tender, non-distended, tolerating TF Extremities: warm/dry, no edema  Skin: no rashes or lesions  Resolved problem list   Assessment and Plan  Out of hospital PEA cardiac arrest  -Per report patient called EMS himself with report of  dyspnea, on EMS arrival he would found pulseless  P: Continuous telemetry Optimize electrolytes  Check ECHO  Medical workup ongoing   Undifferentiated shock  -Patient patient presented post arrest with tachycardia (received Epi during arret) and hypertensive (on pressors) with positive lactic of 3.1. Unable to rule out septic or cardiogenic source   P: Admit ICU Pan cultures prior to antibiotic Continue Cefepime and vanc  Pressors for MAP goal > 65 Trend lactic acid Monitor urine output  Acute Hypoxic Respiratory Failure  -Intubate don ED arrival post arrest  P: Continue ventilator support with lung protective strategies  Wean PEEP and FiO2 for sats greater than 90%. Head of bed elevated 30 degrees. Plateau pressures less than 30 cm H20.  Follow intermittent chest x-ray and ABG.   SAT/SBT as tolerated, mentation preclude extubation  Ensure adequate pulmonary hygiene  Follow cultures  VAP bundle in place  PAD protocol  Acute encephalopathy postcardiac arrest P: Maintain neuro protective measures; goal for eurothermia, euglycemia, eunatermia, normoxia, and PCO2 goal of 35-40 Nutrition and bowel regimen Seizure precautions  Aspirations precautions  Minimize sedation as able  Elevated liver enzymes - Likely secondary to shock state P: Avoid hepatotoxins Trend liver function  Labs   CBC: Recent Labs  Lab 12/24/23 1252  HGB 11.2*  11.2*  HCT 33.0*  33.0*    Basic Metabolic Panel: Recent Labs  Lab 12/24/23 1252  NA 140  139  K 4.1  4.1  CL 103  GLUCOSE 123*  BUN 17  CREATININE 1.20   GFR: CrCl cannot be calculated (Unknown ideal weight.). Recent Labs  Lab 12/24/23 1253  LATICACIDVEN 3.1*    Liver Function Tests: No results for input(s): AST, ALT, ALKPHOS, BILITOT, PROT, ALBUMIN in the last 168 hours. No results for input(s): LIPASE, AMYLASE in the last 168 hours. No results for input(s): AMMONIA in the last 168 hours.  ABG     Component Value Date/Time   PHART 7.399 12/18/2021 0205   PCO2ART 43.2 12/18/2021 0205   PO2ART 64 (L) 12/18/2021 0205   HCO3 25.3 12/24/2023 1252   TCO2 24 12/24/2023 1252   TCO2 26 12/24/2023 1252   ACIDBASEDEF 2.0 01/09/2014 0258   O2SAT 99 12/24/2023 1252     Coagulation Profile: No results for input(s): INR, PROTIME in the last 168 hours.  Cardiac Enzymes: No results for input(s): CKTOTAL, CKMB, CKMBINDEX, TROPONINI in the last 168 hours.  HbA1C: Hgb A1c MFr Bld  Date/Time Value Ref Range Status  01/10/2014 03:40 AM 6.0 (H) <5.7 % Final    Comment:    (NOTE)                                                                       According to the ADA Clinical Practice Recommendations for 2011, when HbA1c is used as a screening test:  >=6.5%   Diagnostic of Diabetes Mellitus           (if abnormal result is confirmed) 5.7-6.4%   Increased risk of developing Diabetes Mellitus References:Diagnosis and Classification of Diabetes Mellitus,Diabetes Care,2011,34(Suppl 1):S62-S69 and Standards of Medical Care in         Diabetes - 2011,Diabetes Care,2011,34 (Suppl 1):S11-S61.     CBG: No results for input(s): GLUCAP in the last 168 hours.  Review of Systems:   Unable to assess   Past Medical History:  He,  has a past medical history of Asthma, Hypertension, and Seasonal allergies.   Surgical History:  History reviewed. No pertinent surgical history.   Social History:   reports that he has never smoked. He has never used smokeless tobacco. He reports that he does not drink alcohol and does not use drugs.   Family History:  His family history is not on file.   Allergies Allergies[1]   Home Medications  Prior to Admission medications  Medication Sig Start Date End Date Taking? Authorizing Provider  albuterol  (PROAIR  HFA) 108 (90 Base) MCG/ACT inhaler Inhale 1-2 puffs into the lungs every 4 (four) hours as needed for wheezing or shortness of breath.  01/06/22   Alexander, Natalie, DO  amLODipine  (NORVASC ) 10 MG tablet Take 10 mg by mouth daily.    [provider]  calcium carbonate (TUMS - DOSED IN MG ELEMENTAL CALCIUM) 500 MG chewable tablet Chew 1 tablet by mouth daily as needed for indigestion or heartburn.    [provider]  guaiFENesin  (MUCINEX ) 600 MG 12 hr tablet Take 1 tablet (600 mg total) by mouth 2 (two) times daily as needed for cough or to loosen phlegm. 01/06/22   Alexander, Natalie, DO  hydrochlorothiazide  (HYDRODIURIL ) 12.5 MG tablet Take 12.5 mg by mouth daily. 11/06/15   [provider]  ipratropium-albuterol  (DUONEB) 0.5-2.5 (3) MG/3ML SOLN Take  3 mLs by nebulization 2 (two) times daily as needed (/wheezing not helped by albuterol  inhaler). 01/06/22   Alexander, Natalie, DO     Critical care time:   .CRITICAL CARE Performed by: Rodric Punch D. Harris   Total critical care time: 40 minutes  Critical care time was exclusive of separately billable procedures and treating other patients.  Critical care was necessary to treat or prevent imminent or life-threatening deterioration.  Critical care was time spent personally by me on the following activities: development of treatment plan with patient and/or surrogate as well as nursing, discussions with consultants, evaluation of patient's response to treatment, examination of patient, obtaining history from patient or surrogate, ordering and performing treatments and interventions, ordering and review of laboratory studies, ordering and review of radiographic studies, pulse oximetry and re-evaluation of patient's condition.  Merrianne Mccumbers D. Harris, NP-C [facility] Personal contact information can be found on Amion  If no contact or response made please call [phone] 12/24/2023, 2:20 PM             [1]  Allergies Allergen Reactions   Ace Inhibitors Swelling    In the face   Azelastine Hcl Swelling

## 2023-12-24 NOTE — Progress Notes (Signed)
°  Echocardiogram 2D Echocardiogram has been performed.  [name] [name] 12/24/2023, 3:16 PM

## 2023-12-24 NOTE — ED Notes (Signed)
 Family Request first call with info regarding pt be Jeronimo Hellberg (daughter-in-law) [phone]. Omarii Scalzo (2nd call) [phone].

## 2023-12-24 NOTE — ED Provider Notes (Signed)
 Reassessed.  Patient off pressors.  Will initiate propofol for the patient.   Yolande Lamar BROCKS, MD 12/24/23 [phone]

## 2023-12-24 NOTE — ED Triage Notes (Signed)
 Pt BIB [facility] as post arrest. Pt called 911 d/t difficulty breathing & stopped responding to them on the phone at 1118. Fire was first on scene & started CPR at 1125, he was seen slumped forward in a chair with a nebulizer treatment going. He received 1 EPI, no shocks, was in PEA then ROSC obtained at 1132.

## 2023-12-24 NOTE — Progress Notes (Signed)
 Echo attempted, care team transporting patient to CT and requests postponing echo to later time. Gave them phone number to call when ready for echocardiogram.

## 2023-12-24 NOTE — ED Provider Notes (Signed)
 [place] EMERGENCY DEPARTMENT AT [facility] Provider Note   CSN: [id number] Arrival date & time: 12/24/23  1233     Patient presents with: No chief complaint on file.   [name] [name] is a 83 y.o. male.   83 year old male history of hypertension and asthma who presents emergency department after cardiac arrest.  Per EMS the patient was feeling short of breath and was on the phone with 911 as he was taking a nebulizer treatment.  At 1118 he stopped responding.  When the fire department arrived he was unresponsive and they initiated CPR at 1125.  Appeared to be in PEA.  He was given epi x 1.  No shocks.  Was intubated in the field and also given Solu-Medrol  and 2 DuoNeb along with Versed and fentanyl.  Was starting to become agitated for them and bucking the vent.       Prior to Admission medications  Medication Sig Start Date End Date Taking? Authorizing Provider  albuterol  (PROAIR  HFA) 108 (90 Base) MCG/ACT inhaler Inhale 1-2 puffs into the lungs every 4 (four) hours as needed for wheezing or shortness of breath. 01/06/22   Alexander, Natalie, DO  amLODipine  (NORVASC ) 10 MG tablet Take 10 mg by mouth daily.    [provider]  calcium carbonate (TUMS - DOSED IN MG ELEMENTAL CALCIUM) 500 MG chewable tablet Chew 1 tablet by mouth daily as needed for indigestion or heartburn.    [provider]  guaiFENesin  (MUCINEX ) 600 MG 12 hr tablet Take 1 tablet (600 mg total) by mouth 2 (two) times daily as needed for cough or to loosen phlegm. 01/06/22   Alexander, Natalie, DO  hydrochlorothiazide  (HYDRODIURIL ) 12.5 MG tablet Take 12.5 mg by mouth daily. 11/06/15   [provider]  ipratropium-albuterol  (DUONEB) 0.5-2.5 (3) MG/3ML SOLN Take 3 mLs by nebulization 2 (two) times daily as needed (/wheezing not helped by albuterol  inhaler). 01/06/22   Alexander, Natalie, DO    Allergies: Ace inhibitors and Azelastine hcl    Review of Systems  Updated Vital  Signs BP (!) 174/111   Pulse (!) 117   Resp 19   Wt 79 kg   BMI 24.29 kg/m   Physical Exam Vitals and nursing note reviewed.  Constitutional:      Appearance: He is well-developed.     Comments: GCS of 3T  HENT:     Head: Normocephalic and atraumatic.     Right Ear: External ear normal.     Left Ear: External ear normal.     Nose: Nose normal.  Eyes:     Extraocular Movements: Extraocular movements intact.     Conjunctiva/sclera: Conjunctivae normal.     Pupils: Pupils are equal, round, and reactive to light.     Comments: Pupils 5 mm and reactive bilateral  Neck:     Comments: In c-collar Cardiovascular:     Rate and Rhythm: Normal rate and regular rhythm.     Heart sounds: Normal heart sounds.  Pulmonary:     Effort: Respiratory distress present.     Comments: Patient being bagged and intubated.  No wheezing appreciated.  Appears to have good air movement.  7-0 tube at 25 cm at the teeth. Abdominal:     General: There is no distension.     Palpations: Abdomen is soft. There is no mass.     Tenderness: There is no abdominal tenderness. There is no guarding.  Musculoskeletal:     Cervical back: Normal range of  motion and neck supple.     Right lower leg: No edema.     Left lower leg: No edema.     Comments: IO in left tibia.  Skin:    General: Skin is warm and dry.  Neurological:     Comments: Occasionally attempts to move upper extremities.  Otherwise does not withdraw all extremities to pain     (all labs ordered are listed, but only abnormal results are displayed) Labs Reviewed  COMPREHENSIVE METABOLIC PANEL WITH GFR - Abnormal; Notable for the following components:      Result Value   Glucose, Bld 124 (*)    Calcium 8.4 (*)    Total Protein 6.0 (*)    AST 237 (*)    ALT 294 (*)    Total Bilirubin 1.5 (*)    All other components within normal limits  CBC - Abnormal; Notable for the following components:   RBC 3.72 (*)    Hemoglobin 10.8 (*)    HCT 33.1  (*)    All other components within normal limits  PROTIME-INR - Abnormal; Notable for the following components:   Prothrombin Time 16.0 (*)    All other components within normal limits  I-STAT CG4 LACTIC ACID, ED - Abnormal; Notable for the following components:   Lactic Acid, Venous 3.1 (*)    All other components within normal limits  I-STAT CHEM 8, ED - Abnormal; Notable for the following components:   Glucose, Bld 123 (*)    Calcium, Ion 1.09 (*)    Hemoglobin 11.2 (*)    HCT 33.0 (*)    All other components within normal limits  I-STAT ARTERIAL BLOOD GAS, ED - Abnormal; Notable for the following components:   pO2, Arterial 612 (*)    Potassium 3.4 (*)    Calcium, Ion 1.13 (*)    HCT 37.0 (*)    Hemoglobin 12.6 (*)    All other components within normal limits  I-STAT VENOUS BLOOD GAS, ED - Abnormal; Notable for the following components:   pCO2, Ven 38.5 (*)    pO2, Ven 113 (*)    Calcium, Ion 1.09 (*)    HCT 33.0 (*)    Hemoglobin 11.2 (*)    All other components within normal limits  TROPONIN I (HIGH SENSITIVITY) - Abnormal; Notable for the following components:   Troponin I (High Sensitivity) 23 (*)    All other components within normal limits  CULTURE, BLOOD (ROUTINE X 2)  CULTURE, BLOOD (ROUTINE X 2)  MRSA NEXT GEN BY PCR, NASAL  APTT  MAGNESIUM  PHOSPHORUS  RAPID URINE DRUG SCREEN, HOSP PERFORMED  BRAIN NATRIURETIC PEPTIDE  I-STAT CG4 LACTIC ACID, ED  CBG MONITORING, ED    EKG: EKG Interpretation Date/Time:  Thursday December 24 2023 12:40:33 EST Ventricular Rate:  96 PR Interval:  248 QRS Duration:  88 QT Interval:  370 QTC Calculation: 468 R Axis:   73  Text Interpretation: Sinus rhythm Prolonged PR interval Abnormal R-wave progression, early transition Confirmed by Yolande Charleston [id number]) on 12/24/2023 12:47:03 PM  Radiology: No results found.   Central Line  Performed by: Yolande Charleston BROCKS, MD Authorized by: Yolande Charleston BROCKS, MD    Consent:    Consent obtained:  Emergent situation Pre-procedure details:    Indication(s): central venous access and insufficient peripheral access     Hand hygiene: Hand hygiene performed prior to insertion     Skin preparation:  Chlorhexidine Sedation:    Sedation type:  Deep  Procedure details:    Location:  R femoral   Patient position:  Supine   Procedural supplies:  Triple lumen   Catheter size:  7 Fr   Ultrasound guidance: yes     Ultrasound guidance timing: prior to insertion and real time     Sterile ultrasound techniques: Sterile gel and sterile probe covers were used     Number of attempts:  1   Successful placement: yes   Post-procedure details:    Post-procedure:  Dressing applied and line sutured   Assessment:  Blood return through all ports and free fluid flow   Procedure completion:  Tolerated well, no immediate complications Comments:     Central line is not fairly sterile and will need to be replaced within 24-48 hours    Medications Ordered in the ED  docusate (COLACE) 50 MG/5ML liquid 100 mg (has no administration in time range)  polyethylene glycol (MIRALAX  / GLYCOLAX ) packet 17 g (has no administration in time range)  fentaNYL in NS (10mcg/ml) infusion-PREMIX (100 mcg/hr Intravenous Rate/Dose Change 12/24/23 1329)  fentaNYL (SUBLIMAZE) bolus via infusion 25-100 mcg (has no administration in time range)  Chlorhexidine Gluconate Cloth 2 % PADS 6 each (0 each Topical Hold 12/24/23 1255)  0.9 %  sodium chloride  infusion (has no administration in time range)  0.9 %  sodium chloride  infusion (has no administration in time range)  norepinephrine (LEVOPHED) 4mg  in (0.016 mg/mL) premix infusion (3 mcg/min Intravenous Rate/Dose Change 12/24/23 1330)  fentaNYL (SUBLIMAZE) 50 MCG/ML injection (has no administration in time range)  ceFEPIme (MAXIPIME) 2 g in sodium chloride  0.9 % 100 mL IVPB (has no administration in time range)  0.9 %  sodium  chloride infusion (has no administration in time range)  vancomycin (VANCOREADY) IVPB 1750 mg/350 mL (has no administration in time range)  docusate (COLACE) 50 MG/5ML liquid 100 mg (has no administration in time range)  polyethylene glycol (MIRALAX  / GLYCOLAX ) packet 17 g (has no administration in time range)  famotidine  (PEPCID ) tablet 20 mg (has no administration in time range)  fentaNYL (SUBLIMAZE) injection 25-50 mcg (50 mcg Intravenous Given 12/24/23 1258)  lactated ringers  bolus 1,000 mL (1,000 mLs Intravenous New Bag/Given 12/24/23 1246)    Clinical Course as of 12/24/23 1351  Thu Dec 24, 2023  1321 DG Chest [place] 1 View Will retract the ETT 2 cm [RP]  1322 Attempted to call wife without response [RP]  1325 Discussed with Donnamarie from ICU [RP]  1346 Family updated. [RP]    Clinical Course User Index [RP] Yolande Lamar BROCKS, MD                                 Medical Decision Making Amount and/or Complexity of Data Reviewed Labs: ordered. Radiology: ordered. Decision-making details documented in ED Course.  Risk OTC drugs. Prescription drug management. Decision regarding hospitalization.   BRESLIN HEMANN is a 83 year old male history of hypertension and asthma who presents emergency department after cardiac arrest.  Initial Ddx:  Cardiac arrest, asthma exacerbation, hypercapnic respiratory failure, hypoxic respiratory failure, MI, PE, pneumonia, pneumothorax  MDM/Course:  Patient presents emergency department respiratory distress.  Later was able to get a hold of his family and reports that has been using his inhaler more recently over the past few days.  Also has had a productive cough.  No fevers or chills.  Thought he was having an asthma exacerbation and called  911 and when they arrived he was in cardiac arrest.  It is suspected that he had a downtime of approximately 7 minutes before CPR was initiated by the fire department.  Found to be in PEA.  He was given epi along  with treatment for suspected asthma exacerbation and intubated in the field.  On arrival initially had a GCS of 3T but then started to block some due to discomfort and was started on sedation again.  Lungs are clear to auscultation bilaterally with good air movement.  Chest x-ray does not show pneumothorax or obvious pneumonia.  ET tube placed by EMS was retracted 2 cm.  His EKG does not show evidence of STEMI.  Venous blood gas without signs of hypercapnia or severe acidosis.  Discussed with the ICU awaiting the remainder of his lab work as well as CT imaging which includes a CT of the head and C-spine as well as a CTA to evaluate for PE.  Upon re-evaluation remained critically ill.  Family updated  This patient presents to the ED for concern of complaints listed in HPI, this involves an extensive number of treatment options, and is a complaint that carries with it a high risk of complications and morbidity. Disposition including potential need for admission considered.   Dispo: ICU  Additional history obtained from spouse and EMS Records reviewed Outpatient Clinic Notes The following labs were independently interpreted: VBG and show no acute abnormality I independently reviewed the following imaging with scope of interpretation limited to determining acute life threatening conditions related to emergency care: Chest x-ray and agree with the radiologist interpretation with the following exceptions: none I personally reviewed and interpreted cardiac monitoring: normal sinus rhythm  I personally reviewed and interpreted the pt's EKG: see above for interpretation  I have reviewed the patients home medications and made adjustments as needed Consults: Critical care Social Determinants of health:  Geriatric  CRITICAL CARE Performed by: Lamar JAYSON Shan   Total critical care time: 45 minutes  Critical care time was exclusive of separately billable procedures and treating other  patients.  Critical care was necessary to treat or prevent imminent or life-threatening deterioration.  Critical care was time spent personally by me on the following activities: development of treatment plan with patient and/or surrogate as well as nursing, discussions with consultants, evaluation of patient's response to treatment, examination of patient, obtaining history from patient or surrogate, ordering and performing treatments and interventions, ordering and review of laboratory studies, ordering and review of radiographic studies, pulse oximetry and re-evaluation of patient's condition.   Portions of this note were generated with Scientist, clinical (histocompatibility and immunogenetics). Dictation errors may occur despite best attempts at proofreading.     Final diagnoses:  Respiratory distress  Cardiac arrest [facility])    ED Discharge Orders     None          Shan Lamar JAYSON, MD 12/24/23 1351

## 2023-12-25 ENCOUNTER — Inpatient Hospital Stay (HOSPITAL_COMMUNITY)

## 2023-12-25 DIAGNOSIS — I214 Non-ST elevation (NSTEMI) myocardial infarction: Secondary | ICD-10-CM | POA: Diagnosis not present

## 2023-12-25 DIAGNOSIS — R7401 Elevation of levels of liver transaminase levels: Secondary | ICD-10-CM | POA: Diagnosis not present

## 2023-12-25 DIAGNOSIS — I469 Cardiac arrest, cause unspecified: Secondary | ICD-10-CM

## 2023-12-25 DIAGNOSIS — J9601 Acute respiratory failure with hypoxia: Secondary | ICD-10-CM

## 2023-12-25 DIAGNOSIS — E43 Unspecified severe protein-calorie malnutrition: Secondary | ICD-10-CM | POA: Insufficient documentation

## 2023-12-25 DIAGNOSIS — G9340 Encephalopathy, unspecified: Secondary | ICD-10-CM

## 2023-12-25 DIAGNOSIS — R569 Unspecified convulsions: Secondary | ICD-10-CM

## 2023-12-25 LAB — BASIC METABOLIC PANEL WITH GFR
Anion gap: 9 (ref 5–15)
BUN: 16 mg/dL (ref 8–23)
CO2: 21 mmol/L — ABNORMAL LOW (ref 22–32)
Calcium: 8.3 mg/dL — ABNORMAL LOW (ref 8.9–10.3)
Chloride: 106 mmol/L (ref 98–111)
Creatinine, Ser: 0.85 mg/dL (ref 0.61–1.24)
GFR, Estimated: >60 mL/min (ref >60)
Glucose, Bld: 91 mg/dL (ref 70–99)
Potassium: 3.7 mmol/L (ref 3.5–5.1)
Sodium: 136 mmol/L (ref 135–145)

## 2023-12-25 LAB — GLUCOSE, CAPILLARY
Glucose-Capillary: 118 mg/dL — ABNORMAL HIGH (ref 70–99)
Glucose-Capillary: 76 mg/dL (ref 70–99)
Glucose-Capillary: 81 mg/dL (ref 70–99)
Glucose-Capillary: 91 mg/dL (ref 70–99)
Glucose-Capillary: 93 mg/dL (ref 70–99)
Glucose-Capillary: 96 mg/dL (ref 70–99)

## 2023-12-25 LAB — MAGNESIUM: Magnesium: 1.8 mg/dL (ref 1.7–2.4)

## 2023-12-25 LAB — MRSA NEXT GEN BY PCR, NASAL: MRSA by PCR Next Gen: NOT DETECTED

## 2023-12-25 LAB — CBC
HCT: 33.2 % — ABNORMAL LOW (ref 39.0–52.0)
Hemoglobin: 11.2 g/dL — ABNORMAL LOW (ref 13.0–17.0)
MCH: 28.9 pg (ref 26.0–34.0)
MCHC: 33.7 g/dL (ref 30.0–36.0)
MCV: 85.6 fL (ref 80.0–100.0)
Platelets: 189 K/uL (ref 150–400)
RBC: 3.88 MIL/uL — ABNORMAL LOW (ref 4.22–5.81)
RDW: 14.6 % (ref 11.5–15.5)
WBC: 7.6 K/uL (ref 4.0–10.5)
nRBC: 0 % (ref 0.0–0.2)

## 2023-12-25 LAB — RESP PANEL BY RT-PCR (RSV, FLU A&B, COVID)  RVPGX2
Influenza A by PCR: NEGATIVE
Influenza B by PCR: NEGATIVE
Resp Syncytial Virus by PCR: NEGATIVE
SARS Coronavirus 2 by RT PCR: NEGATIVE

## 2023-12-25 LAB — PHOSPHORUS: Phosphorus: 2.8 mg/dL (ref 2.5–4.6)

## 2023-12-25 MED ORDER — LIDOCAINE 5 % EX PTCH
1.0000 | MEDICATED_PATCH | CUTANEOUS | Status: DC
  Administered 2023-12-25 – 2023-12-27 (×3): 1 via TRANSDERMAL
  Filled 2023-12-25 (×3): qty 1

## 2023-12-25 MED ORDER — SODIUM CHLORIDE 0.9 % IV SOLN
2.0000 g | Freq: Two times a day (BID) | INTRAVENOUS | Status: DC
  Administered 2023-12-25 (×2): 2 g via INTRAVENOUS
  Filled 2023-12-25 (×2): qty 12.5

## 2023-12-25 MED ORDER — ENOXAPARIN SODIUM 40 MG/0.4ML IJ SOSY
40.0000 mg | PREFILLED_SYRINGE | INTRAMUSCULAR | Status: DC
  Administered 2023-12-25 – 2023-12-27 (×3): 40 mg via SUBCUTANEOUS
  Filled 2023-12-25 (×3): qty 0.4

## 2023-12-25 MED ORDER — OSMOLITE 1.5 CAL PO LIQD
1000.0000 mL | ORAL | Status: DC
  Administered 2023-12-25 – 2023-12-26 (×2): 1000 mL
  Filled 2023-12-25 (×4): qty 1000

## 2023-12-25 MED ORDER — PROSOURCE TF20 ENFIT COMPATIBL EN LIQD
60.0000 mL | Freq: Every day | ENTERAL | Status: DC
  Administered 2023-12-25 – 2023-12-26 (×2): 60 mL
  Filled 2023-12-25 (×3): qty 60

## 2023-12-25 MED ORDER — METHYLPREDNISOLONE SODIUM SUCC 125 MG IJ SOLR
80.0000 mg | Freq: Two times a day (BID) | INTRAMUSCULAR | Status: DC
  Administered 2023-12-25 (×2): 80 mg via INTRAVENOUS
  Filled 2023-12-25 (×2): qty 2

## 2023-12-25 MED ORDER — SODIUM CHLORIDE 0.9 % IV SOLN
2.0000 g | INTRAVENOUS | Status: DC
  Administered 2023-12-25 – 2023-12-26 (×2): 2 g via INTRAVENOUS
  Filled 2023-12-25 (×2): qty 20

## 2023-12-25 MED ORDER — INSULIN ASPART 100 UNIT/ML IJ SOLN
0.0000 [IU] | INTRAMUSCULAR | Status: DC
  Administered 2023-12-26 (×3): 2 [IU] via SUBCUTANEOUS
  Administered 2023-12-26 (×2): 1 [IU] via SUBCUTANEOUS
  Administered 2023-12-26: 2 [IU] via SUBCUTANEOUS
  Administered 2023-12-27 (×3): 1 [IU] via SUBCUTANEOUS
  Filled 2023-12-25: qty 2
  Filled 2023-12-25: qty 1
  Filled 2023-12-25: qty 2
  Filled 2023-12-25 (×2): qty 1
  Filled 2023-12-25: qty 2
  Filled 2023-12-25: qty 1
  Filled 2023-12-25: qty 2
  Filled 2023-12-25: qty 1

## 2023-12-25 MED ORDER — ARFORMOTEROL TARTRATE 15 MCG/2ML IN NEBU
15.0000 ug | INHALATION_SOLUTION | Freq: Two times a day (BID) | RESPIRATORY_TRACT | Status: DC
  Administered 2023-12-25 – 2023-12-27 (×4): 15 ug via RESPIRATORY_TRACT
  Filled 2023-12-25 (×4): qty 2

## 2023-12-25 MED ORDER — PANTOPRAZOLE SODIUM 40 MG IV SOLR
40.0000 mg | INTRAVENOUS | Status: DC
  Administered 2023-12-25 – 2023-12-27 (×3): 40 mg via INTRAVENOUS
  Filled 2023-12-25 (×3): qty 10

## 2023-12-25 MED ORDER — VANCOMYCIN HCL 1250 MG/250ML IV SOLN
1250.0000 mg | INTRAVENOUS | Status: DC
  Filled 2023-12-25: qty 250

## 2023-12-25 NOTE — Plan of Care (Signed)
°  Problem: Education: Goal: Knowledge of General Education information will improve Description: Including pain rating scale, medication(s)/side effects and non-pharmacologic comfort measures Outcome: Progressing   Problem: Clinical Measurements: Goal: Ability to maintain clinical measurements within normal limits will improve Outcome: Progressing Goal: Will remain free from infection Outcome: Progressing Goal: Diagnostic test results will improve Outcome: Progressing Goal: Cardiovascular complication will be avoided Outcome: Progressing   Problem: Nutrition: Goal: Adequate nutrition will be maintained Outcome: Progressing   Problem: Pain Managment: Goal: General experience of comfort will improve and/or be controlled Outcome: Progressing   Problem: Skin Integrity: Goal: Risk for impaired skin integrity will decrease Outcome: Progressing

## 2023-12-25 NOTE — Progress Notes (Signed)
 Initial Nutrition Assessment  DOCUMENTATION CODES:  Severe malnutrition in context of social or environmental circumstances  INTERVENTION:  Initiate tube feeding via OGT: Osmolite 1.5 at 50 ml/h (1200 ml per day) Start at 20 and advance by 10mL every 8 hours to reach goal Prosource TF20 60 ml 1x/d Provides 1880 kcal, 95 gm protein, 914 ml free water daily  NUTRITION DIAGNOSIS:  Severe Malnutrition related to social / environmental circumstances (prolonged inadequate intake) as evidenced by severe fat depletion, severe muscle depletion.  GOAL:  Patient will meet greater than or equal to 90% of their needs  MONITOR:  Vent status, Labs, I & O's, TF tolerance  REASON FOR ASSESSMENT:  Ventilator, Consult Enteral/tube feeding initiation and management  ASSESSMENT:  Pt with hx of HTN, HLD and asthma presented to ED after PEA arrest. Intubated in the field.  12/11 - presented to ED intubated   Patient is currently intubated on ventilator support. Wife and son present at bedside able to provide a nutrition hx. Wife reports that pt's appetite has been declining for about a year and she has also noted weight loss. Used to weight >200 lbs and thinks that recently he has been staying ~180 lbs.   Discussed usual intake and breakfast is best meal (eggs, sausage, a pancake) with intake dwindling over the course of the day. Typically only has a snack for lunch (cracker) and then will have soup for dinner. Also endorses energy levels have declined and that he get winded with activity.  On exam, pt with muscle and fat depletions throughout body suggestive of long-term undernourishment.   MV: 10.2 L/min Temp (24hrs), Avg:97.9 F (36.6 C), Min:94.6 F (34.8 C), Max:99.9 F (37.7 C) MAP (Art Line): 67-64mmHg this AM  Propofol: 16.6 ml/hr (438 kcal/d)  Admit / Current weight: 79 kg No recent wt hx available   Intake/Output Summary (Last 24 hours) at 12/25/2023 1319 Last data filed at  12/25/2023 1300 Gross per 24 hour  Intake 2412.81 ml  Output 1430 ml  Net 982.81 ml  Net IO Since Admission: 982.81 mL [12/25/23 1319]  Drains/Lines: CVC Right Femoral, triple lumen OGT 16 Fr. (Gastric) UOP x 24 hours  Nutritionally Relevant Medications: Scheduled Meds:  docusate  100 mg Per Tube BID   PROSource TF20  60 mL Per Tube Daily   insulin  aspart  0-9 Units Subcutaneous Q4H   methylPREDNISolone  injection  80 mg Intravenous Q12H   pantoprazole  IV  40 mg Intravenous Q24H   polyethylene glycol  17 g Per Tube Daily   Continuous Infusions:  cefTRIAXone (ROCEPHIN)  IV     feeding supplement (OSMOLITE 1.5 CAL) 20 mL/hr at 12/25/23 1200   propofol (DIPRIVAN) infusion 30 mcg/kg/min (12/25/23 1200)   PRN Meds: docusate, polyethylene glycol  Labs Reviewed: CBG ranges from 76-123 mg/dL over the last 24 hours  NUTRITION - FOCUSED PHYSICAL EXAM: Flowsheet Row Most Recent Value  Orbital Region Severe depletion  Upper Arm Region Moderate depletion  Thoracic and Lumbar Region Severe depletion  Buccal Region Severe depletion  Temple Region Severe depletion  Clavicle Bone Region Moderate depletion  Clavicle and Acromion Bone Region Severe depletion  Scapular Bone Region Severe depletion  Dorsal Hand Mild depletion  Patellar Region Severe depletion  Anterior Thigh Region Severe depletion  Posterior Calf Region Severe depletion  Edema (RD Assessment) None  Hair Reviewed  Eyes Reviewed  Mouth Reviewed  Skin Reviewed  Nails Reviewed    Diet Order:   Diet Order  Diet NPO time specified  Diet effective now                   EDUCATION NEEDS:  Not appropriate for education at this time  Skin:  Skin Assessment: Reviewed RN Assessment  Last BM:  PTA  Height:  Ht Readings from Last 1 Encounters:  12/24/23 5' 11 (1.803 m)    Weight:  Wt Readings from Last 1 Encounters:  12/25/23 79 kg    Ideal Body Weight:  78.2 kg  BMI:  Body mass  index is 24.29 kg/m.  Estimated Nutritional Needs:  Kcal:  1700-1900 kcal/d Protein:  90-105g/d Fluid:  1.8-2L/d    Vernell Lukes, RD, LDN, CNSC Registered Dietitian II Please reach out via secure chat

## 2023-12-25 NOTE — TOC Progression Note (Signed)
 Transition of Care [facility]) - Progression Note    Patient Details  Name: [name] [name] MRN: [id number] Date of Birth: [birth year]  [facility]) CM/SW Contact  Lauraine FORBES Saa, LCSWA Phone Number: 12/25/2023, 12:13 PM  Clinical Narrative:     12:13 PM CSW introduced self and role to patient's son, Nissim (per progressions, patient is intubated with feeding tube and son and daughter in law requested to be primary contacts). Patient's son confirmed patient resides at home with spouse who provided transportation for appointments. Patient's son stated patient uses DME (nebulizer, RW, cane) at home but does not have HH or DME history. Patient's son confirmed patient's insurance coverage. Per chart review, patient has a PCP and preferred pharmacy [facility] [phone] [facility]). No TOC needs identified at this time. TOC will continue to follow.  Expected Discharge Plan: Home/Self Care Barriers to Discharge: Continued Medical Work up               Expected Discharge Plan and Services       Living arrangements for the past 2 months: Single Family Home                                       Social Drivers of Health (SDOH) Interventions SDOH Screenings   Food Insecurity: Patient Unable To Answer (12/24/2023)  Housing: Unknown (12/24/2023)  Transportation Needs: Patient Unable To Answer (12/24/2023)  Utilities: Patient Unable To Answer (12/24/2023)  Social Connections: Unknown (12/24/2023)  Tobacco Use: Low Risk (12/24/2023)    Readmission Risk Interventions     No data to display

## 2023-12-25 NOTE — Procedures (Signed)
 Patient Name: CLAYTON BOSSERMAN  MRN: [id number]  Epilepsy Attending: Arlin MALVA Krebs  Referring Physician/Provider: Heddy Barren, DO  Date: 12/25/2023 Duration: 25.09 mins  Patient history: 83yo M s/p cardiac arrest. EEG to evaluate for seizure  Level of alertness:  comatose  AEDs during EEG study: Propofol  Technical aspects: This EEG study was done with scalp electrodes positioned according to the 10-20 International system of electrode placement. Electrical activity was reviewed with band pass filter of 1-70Hz , sensitivity of 7 uV/mm, display speed of 65mm/sec with a 60Hz  notched filter applied as appropriate. EEG data were recorded continuously and digitally stored.  Video monitoring was available and reviewed as appropriate.  Description: EEG showed continuous generalized 3 to 6 Hz theta-delta slowing. Hyperventilation and photic stimulation were not performed.     ABNORMALITY - Continuous slow, generalized  IMPRESSION: This study is suggestive of generalized cerebral dysfunction ( encephalopathy). No seizures or epileptiform discharges were seen throughout the recording.  Pinkney Venard O Cherrise Occhipinti

## 2023-12-25 NOTE — Progress Notes (Addendum)
 NAME:  [name] [name], MRN:  [id number], DOB:  1940/10/27, LOS: 1 ADMISSION DATE:  12/24/2023, CONSULTATION DATE:  12/24/2023 REFERRING MD:  Dr. Yolande, CHIEF COMPLAINT:  cardiac arrest    History of Present Illness:  HPI obtained from EMR review as patient is encephalopathic.    21 yoM with PMH significant for never smoker, asthma, HTN, HLD, malnutrition, seasonal allergies who presented by EMS after cardiac arrest.  Per chart, pt had called 911 around 11:18 himself c/o difficulty breathing but stopped responding to them.  Found by fire department slumped over in chair with nebulizer going, CPR initiated at 1125, found to be in PEA with ROSC obtained at 1132 after 1 epi.   On ER arrival, SBP in 70's and started on vasopressor support.    Pertinent  Medical History   Past Medical History:  Diagnosis Date   Asthma    Hypertension    Seasonal allergies     Significant Hospital Events: Including procedures, antibiotic start and stop dates in addition to other pertinent events   12/11 OOH cardiac arrest 2/2 respiratory failure  12/12 > transitioned off cefepime to CTX, stop vanc. Start steroids and bronchodilators   Interim History / Subjective:  O/N: C collar was discontinued   Sedated on ventilator. Does not follow commands.   Objective    Blood pressure 95/61, pulse 60, temperature 98.6 F (37 C), resp. rate 18, height 5' 11 (1.803 m), weight 79 kg, SpO2 100%.    Vent Mode: PRVC FiO2 (%):  [40 %-100 %] 40 % Set Rate:  [18 bmp] 18 bmp Vt Set:  [560 mL-600 mL] 600 mL PEEP:  [5 cmH20] 5 cmH20 Plateau Pressure:  [16 cmH20-17 cmH20] 17 cmH20   Intake/Output Summary (Last 24 hours) at 12/25/2023 0626 Last data filed at 12/25/2023 0600 Gross per 24 hour  Intake 2065.32 ml  Output 1300 ml  Net 765.32 ml   Filed Weights   12/24/23 1315 12/25/23 0326  Weight: 79 kg 79 kg    Examination: General: ill appearing, laying in bed  HENT: 3 mm pupils, responsive to light,  AT/[place] Lungs: ETT, OGT; poor inspiratory airflow, with diffuse wheeze, diminished RLL and decreased LLL  Cardiovascular: RR  Abdomen: soft, ND  Extremities: bilateral bony feet abnormalities, cool to touch. LE are in cushion and SCD. B/l UE warm to touch. No LE edema noted.  Neuro: Sedated  GU: Foley   Labs:  NA 136, K3.7, Bicarb 21, Scr 0.85, Mag 1.8, Phos 2.8 WBC 7.6, Hgb 11.2, PLT 189  CBG 91   MRSA swab is negative  Blood cultures negative   Resolved problem list   Assessment and Plan   Neuro: Acute encephalopathy postcardiac arrest UDS positive for benzo (likely given by EMS, intubated OOH); no acute electrolyte abnormalities  CTH negative for acute intracranial abnormality  - Wean down sedation to evaluate mentation  - F/u spot EEG  - Maintain neuro protective measures; goal for eurothermia, euglycemia, eunatermia, normoxia, and PCO2 goal of 35-40  Respiratory:  Acute Hypoxic Respiratory Failure requiring intubation 12/11 prior to ED arrival  Acute Asthma Exacerbation  Aspiration pneumonitis  CTA negative for PE, CT chest noted minimal tiny areas of ground glass opacities in both upper lobes, notable for air trapping. No infiltrates noted.  On exam, severe narrow airways with diffuse wheeze -Continue mechanical ventilation, higher Peak pressures this morning  - LTVV, 4-8cc/kg IBW with goal Pplat<30 and DP<15 - Daily SBT/WUA - VAP protocol  - PAD  protocol > wean down Prop and Fent with RASS goal -1 to 0  - Abx de-escalate to CTX today, pending culture data  - Start Solumedrol 80 mg Q12 hours, monitor for symptoms improvement prior to de-escalation  - Start Bronchodilator therapy > Brovana nebulizer BID and PRN Duonebs    Nondisplaced bilateral anterior rib fractures 2/2 chest compressions - Lidocaine  patches and PRN tylenol    Cardiac:  OOH PEA cardiac arrest 2/2 hypoxic respiratory failure > lactic acidosis and demand cardiac ischemia  Echo showed LVEF 55 to 60%,  normal LV function, no RWMA, mildly reduced RV function  Trop 23 > 157  Lactic acid 3.1 > 2.0 , improved after IVF  Required pressors briefly > off levo since 12/11, maintaining maps > 65  - telemetry   GI:  Elevated liver enzymes 2/2 shock state above  AST 237, ALT 294, T Bili 1.5  Avoid hepatotoxins - Trend liver function  Bowel: Colace, miralax   Nutrition: OGT, RD consulted to start TF today   Renal:  No AKI, UOP 1.3L , net +766  - Monitor Daily I/Os - Maintain MAP>65 for optimal renal perfusion.  - Avoid nephrotoxic agents such as IV contrast, NSAIDs, and phosphate containing bowel preps (FLEETS)  ENDO: No hx of diabetes  CBG 76 this AM Will plan on starting TF today > SSI thin   HEME/ONC Normocytic anemia Hgb 11.2 (10.8), no signs and symptoms of bleeding    DVT prophylaxis: Lovenox    MSK:  PT/OT when extubated   Family at bedside updated  Lines: Remove Femoral line   Labs   CBC: Recent Labs  Lab 12/24/23 1242 12/24/23 1252 12/24/23 1336 12/25/23 0355  WBC 8.6  --   --  7.6  HGB 10.8* 11.2*  11.2* 12.6* 11.2*  HCT 33.1* 33.0*  33.0* 37.0* 33.2*  MCV 89.0  --   --  85.6  PLT 187  --   --  189    Basic Metabolic Panel: Recent Labs  Lab 12/24/23 1242 12/24/23 1252 12/24/23 1336 12/25/23 0355  NA 139 140  139 139 136  K 4.1 4.1  4.1 3.4* 3.7  CL 103 103  --  106  CO2 25  --   --  21*  GLUCOSE 124* 123*  --  91  BUN 14 17  --  16  CREATININE 1.07 1.20  --  0.85  CALCIUM 8.4*  --   --  8.3*  MG 1.9  --   --  1.8  PHOS 4.0  --   --  2.8   GFR: Estimated Creatinine Clearance: 68.9 mL/min (by C-G formula based on SCr of 0.85 mg/dL). Recent Labs  Lab 12/24/23 1242 12/24/23 1253 12/24/23 1551 12/25/23 0355  WBC 8.6  --   --  7.6  LATICACIDVEN  --  3.1* 2.0*  --     Liver Function Tests: Recent Labs  Lab 12/24/23 1242  AST 237*  ALT 294*  ALKPHOS 81  BILITOT 1.5*  PROT 6.0*  ALBUMIN 3.5   No results for input(s): LIPASE,  AMYLASE in the last 168 hours. No results for input(s): AMMONIA in the last 168 hours.  ABG    Component Value Date/Time   PHART 7.379 12/24/2023 1336   PCO2ART 42.1 12/24/2023 1336   PO2ART 612 (H) 12/24/2023 1336   HCO3 24.9 12/24/2023 1336   TCO2 26 12/24/2023 1336   ACIDBASEDEF 2.0 01/09/2014 0258   O2SAT 100 12/24/2023 1336     Coagulation Profile:  Recent Labs  Lab 12/24/23 1242  INR 1.2    Cardiac Enzymes: No results for input(s): CKTOTAL, CKMB, CKMBINDEX, TROPONINI in the last 168 hours.  HbA1C: Hgb A1c MFr Bld  Date/Time Value Ref Range Status  01/10/2014 03:40 AM 6.0 (H) <5.7 % Final    Comment:    (NOTE)                                                                       According to the ADA Clinical Practice Recommendations for 2011, when HbA1c is used as a screening test:  >=6.5%   Diagnostic of Diabetes Mellitus           (if abnormal result is confirmed) 5.7-6.4%   Increased risk of developing Diabetes Mellitus References:Diagnosis and Classification of Diabetes Mellitus,Diabetes Care,2011,34(Suppl 1):S62-S69 and Standards of Medical Care in         Diabetes - 2011,Diabetes Care,2011,34 (Suppl 1):S11-S61.     CBG: Recent Labs  Lab 12/24/23 1634 12/24/23 1803 12/24/23 2031 12/24/23 2352 12/25/23 0402  GLUCAP 103* 110* 110* 123* 91    Review of Systems:   As above   Past Medical History:  He,  has a past medical history of Asthma, Hypertension, and Seasonal allergies.   Surgical History:  History reviewed. No pertinent surgical history.   Social History:   reports that he has never smoked. He has never used smokeless tobacco. He reports that he does not drink alcohol and does not use drugs.   Family History:  His family history is not on file.   Allergies Allergies[1]   Home Medications  Prior to Admission medications  Medication Sig Start Date End Date Taking? Authorizing Provider  albuterol  (PROAIR  HFA) 108 (90  Base) MCG/ACT inhaler Inhale 1-2 puffs into the lungs every 4 (four) hours as needed for wheezing or shortness of breath. 01/06/22   Alexander, Natalie, DO  amLODipine  (NORVASC ) 10 MG tablet Take 10 mg by mouth daily.    [provider]  calcium carbonate (TUMS - DOSED IN MG ELEMENTAL CALCIUM) 500 MG chewable tablet Chew 1 tablet by mouth daily as needed for indigestion or heartburn.    [provider]  guaiFENesin  (MUCINEX ) 600 MG 12 hr tablet Take 1 tablet (600 mg total) by mouth 2 (two) times daily as needed for cough or to loosen phlegm. 01/06/22   Alexander, Natalie, DO  hydrochlorothiazide  (HYDRODIURIL ) 12.5 MG tablet Take 12.5 mg by mouth daily. 11/06/15   [provider]  ipratropium-albuterol  (DUONEB) 0.5-2.5 (3) MG/3ML SOLN Take 3 mLs by nebulization 2 (two) times daily as needed (/wheezing not helped by albuterol  inhaler). 01/06/22   Alexander, Natalie, DO     Critical care time:               [1]  Allergies Allergen Reactions   Ace Inhibitors Swelling    In the face   Azelastine Hcl Swelling

## 2023-12-25 NOTE — Progress Notes (Signed)
 Pharmacy Antibiotic Note  [name] [name] is a 83 y.o. male admitted on 12/24/2023 s/p cardiac arrest with possible sepsis.  Pharmacy has been consulted for Vancomycin dosing.  Plan: Vancomycin 1250 mg IV q24h  Height: 5' 11 (180.3 cm) Weight: 79 kg (174 lb 2.6 oz) IBW/kg (Calculated) : 75.3  Temp (24hrs), Avg:97.2 F (36.2 C), Min:94.6 F (34.8 C), Max:99.5 F (37.5 C)  Recent Labs  Lab 12/24/23 1242 12/24/23 1252 12/24/23 1253 12/24/23 1551  WBC 8.6  --   --   --   CREATININE 1.07 1.20  --   --   LATICACIDVEN  --   --  3.1* 2.0*    Estimated Creatinine Clearance: 48.8 mL/min (by C-G formula based on SCr of 1.2 mg/dL).    Allergies[1]  Jazzmine Kleiman, Cordella Misty 12/25/2023 1:20 AM     [1]  Allergies Allergen Reactions   Ace Inhibitors Swelling    In the face   Azelastine Hcl Swelling

## 2023-12-25 NOTE — Progress Notes (Signed)
 EEG complete - results pending

## 2023-12-26 DIAGNOSIS — J9601 Acute respiratory failure with hypoxia: Secondary | ICD-10-CM | POA: Diagnosis not present

## 2023-12-26 DIAGNOSIS — J45901 Unspecified asthma with (acute) exacerbation: Secondary | ICD-10-CM

## 2023-12-26 DIAGNOSIS — R7401 Elevation of levels of liver transaminase levels: Secondary | ICD-10-CM | POA: Diagnosis not present

## 2023-12-26 DIAGNOSIS — J690 Pneumonitis due to inhalation of food and vomit: Secondary | ICD-10-CM | POA: Diagnosis not present

## 2023-12-26 DIAGNOSIS — G9340 Encephalopathy, unspecified: Secondary | ICD-10-CM | POA: Diagnosis not present

## 2023-12-26 DIAGNOSIS — I469 Cardiac arrest, cause unspecified: Secondary | ICD-10-CM | POA: Diagnosis not present

## 2023-12-26 DIAGNOSIS — I214 Non-ST elevation (NSTEMI) myocardial infarction: Secondary | ICD-10-CM | POA: Diagnosis not present

## 2023-12-26 LAB — HEPATIC FUNCTION PANEL
ALT: 176 U/L — ABNORMAL HIGH (ref 0–44)
AST: 75 U/L — ABNORMAL HIGH (ref 15–41)
Albumin: 3.2 g/dL — ABNORMAL LOW (ref 3.5–5.0)
Alkaline Phosphatase: 79 U/L (ref 38–126)
Bilirubin, Direct: 0.2 mg/dL (ref 0.0–0.2)
Indirect Bilirubin: 0.4 mg/dL (ref 0.3–0.9)
Total Bilirubin: 0.6 mg/dL (ref 0.0–1.2)
Total Protein: 6.3 g/dL — ABNORMAL LOW (ref 6.5–8.1)

## 2023-12-26 LAB — BASIC METABOLIC PANEL WITH GFR
Anion gap: 9 (ref 5–15)
BUN: 19 mg/dL (ref 8–23)
CO2: 21 mmol/L — ABNORMAL LOW (ref 22–32)
Calcium: 8.5 mg/dL — ABNORMAL LOW (ref 8.9–10.3)
Chloride: 108 mmol/L (ref 98–111)
Creatinine, Ser: 0.81 mg/dL (ref 0.61–1.24)
GFR, Estimated: >60 mL/min (ref >60)
Glucose, Bld: 134 mg/dL — ABNORMAL HIGH (ref 70–99)
Potassium: 3.6 mmol/L (ref 3.5–5.1)
Sodium: 138 mmol/L (ref 135–145)

## 2023-12-26 LAB — PHOSPHORUS: Phosphorus: 1.9 mg/dL — ABNORMAL LOW (ref 2.5–4.6)

## 2023-12-26 LAB — CBC
HCT: 34.2 % — ABNORMAL LOW (ref 39.0–52.0)
Hemoglobin: 12.1 g/dL — ABNORMAL LOW (ref 13.0–17.0)
MCH: 29.7 pg (ref 26.0–34.0)
MCHC: 35.4 g/dL (ref 30.0–36.0)
MCV: 83.8 fL (ref 80.0–100.0)
Platelets: 188 K/uL (ref 150–400)
RBC: 4.08 MIL/uL — ABNORMAL LOW (ref 4.22–5.81)
RDW: 14.7 % (ref 11.5–15.5)
WBC: 7.2 K/uL (ref 4.0–10.5)
nRBC: 0 % (ref 0.0–0.2)

## 2023-12-26 LAB — GLUCOSE, CAPILLARY
Glucose-Capillary: 124 mg/dL — ABNORMAL HIGH (ref 70–99)
Glucose-Capillary: 158 mg/dL — ABNORMAL HIGH (ref 70–99)
Glucose-Capillary: 128 mg/dL — ABNORMAL HIGH (ref 70–99)
Glucose-Capillary: 152 mg/dL — ABNORMAL HIGH (ref 70–99)
Glucose-Capillary: 166 mg/dL — ABNORMAL HIGH (ref 70–99)
Glucose-Capillary: 158 mg/dL — ABNORMAL HIGH (ref 70–99)

## 2023-12-26 LAB — HEMOGLOBIN A1C
Hgb A1c MFr Bld: 4.8 % (ref 4.8–5.6)
Mean Plasma Glucose: 91.06 mg/dL

## 2023-12-26 LAB — MAGNESIUM: Magnesium: 2.1 mg/dL (ref 1.7–2.4)

## 2023-12-26 MED ORDER — METHYLPREDNISOLONE SODIUM SUCC 40 MG IJ SOLR
40.0000 mg | Freq: Two times a day (BID) | INTRAMUSCULAR | Status: DC
  Administered 2023-12-26 (×2): 40 mg via INTRAVENOUS
  Filled 2023-12-26 (×3): qty 1

## 2023-12-26 MED ORDER — POTASSIUM & SODIUM PHOSPHATES 280-160-250 MG PO PACK
2.0000 | PACK | ORAL | Status: AC
  Administered 2023-12-26 (×4): 2
  Filled 2023-12-26 (×4): qty 2

## 2023-12-26 MED ORDER — DEXMEDETOMIDINE HCL IN NACL 400 MCG/100ML IV SOLN
0.0000 ug/kg/h | INTRAVENOUS | Status: DC
  Administered 2023-12-26 (×2): 0.4 ug/kg/h via INTRAVENOUS
  Filled 2023-12-26 (×2): qty 100

## 2023-12-26 NOTE — Progress Notes (Signed)
 [facility] ADULT ICU REPLACEMENT PROTOCOL   The patient does apply for the [facility] Adult ICU Electrolyte Replacment Protocol based on the criteria listed below:   1.Exclusion criteria: TCTS, ECMO, Dialysis, and Myasthenia Gravis patients 2. Is GFR >/= 30 ml/min? Yes.    Patient's GFR today is >60 3. Is SCr </= 2? Yes.   Patient's SCr is 0.81 mg/dL 4. Did SCr increase >/= 0.5 in 24 hours? No. 5.Pt's weight >40kg  Yes.   6. Abnormal electrolyte(s): Phos 1.9  7. Electrolytes replaced per protocol 8.  Call MD STAT for K+ </= 2.5, Phos </= 1, or Mag </= 1 Physician:  Epimenio Ole BIRCH [facility] 12/26/2023 5:14 AM

## 2023-12-26 NOTE — Progress Notes (Signed)
 NAME:  [name] [name], MRN:  [id number], DOB:  [birth year], LOS: 2 ADMISSION DATE:  12/24/2023, CONSULTATION DATE:  12/24/2023 REFERRING MD:  Dr. Yolande, CHIEF COMPLAINT:  cardiac arrest    History of Present Illness:  HPI obtained from EMR review as patient is encephalopathic.    43 yoM with PMH significant for never smoker, asthma, HTN, HLD, malnutrition, seasonal allergies who presented by EMS after cardiac arrest.  Per chart, pt had called 911 around 11:18 himself c/o difficulty breathing but stopped responding to them.  Found by fire department slumped over in chair with nebulizer going, CPR initiated at 1125, found to be in PEA with ROSC obtained at 1132 after 1 epi.   On ER arrival, SBP in 70's and started on vasopressor support.    Pertinent  Medical History   Past Medical History:  Diagnosis Date   Asthma    Hypertension    Seasonal allergies     Significant Hospital Events: Including procedures, antibiotic start and stop dates in addition to other pertinent events   12/11 OOH cardiac arrest 2/2 respiratory failure  12/12 > transitioned off cefepime  to CTX, stop vanc. Start steroids and bronchodilators   Interim History / Subjective:  Sedated on the ventilator.  When sedation is shut off, he opens his eyes, does not track.   Objective    Blood pressure 120/68, pulse (!) 56, temperature 97.9 F (36.6 C), resp. rate 18, height 5' 11 (1.803 m), weight 77.8 kg, SpO2 100%.    Vent Mode: PRVC FiO2 (%):  [40 %] 40 % Set Rate:  [18 bmp] 18 bmp Vt Set:  [600 mL] 600 mL PEEP:  [5 cmH20] 5 cmH20 Plateau Pressure:  [16 cmH20-17 cmH20] 16 cmH20   Intake/Output Summary (Last 24 hours) at 12/26/2023 0800 Last data filed at 12/26/2023 0600 Gross per 24 hour  Intake 1387.33 ml  Output 680 ml  Net 707.33 ml   Filed Weights   12/24/23 1315 12/25/23 0326 12/26/23 0500  Weight: 79 kg 79 kg 77.8 kg    Examination: General:  ill appearing, intubated, sedated  HENT: 3 mm  pupils, responsive to light, AT/[place] Lungs: ETT, OGT; airflow much improved with better air entry bilaterally compared to yesterday  Cardiovascular: RRR, Nl S1, S2 no M/R/G Abdomen: Soft, NT, ND   Extremities: bilateral foot deformities, warm to touch. No edema Neuro:  Sedated, opens eyes, does not track, difficult to assess motor and sensation  GU: Foley     Resolved problem list   Assessment and Plan   Neuro: Acute encephalopathy postcardiac arrest UDS positive for benzo (likely given by EMS, intubated OOH); no acute electrolyte abnormalities  CTH negative for acute intracranial abnormality  - Wean down sedation to evaluate mentation  - F/u spot EEG  : show acute encephalopathy with no evidence of seizures  - Maintain neuro protective measures; goal for eurothermia, euglycemia, eunatermia, normoxia, and PCO2 goal of 35-40 - Sedation holidays to assess mental status - Consider MRI if poor mental status persists   Respiratory:  Acute Hypoxic Respiratory Failure requiring intubation 12/11 prior to ED arrival  Acute Asthma Exacerbation  Aspiration pneumonitis  CTA negative for PE, CT chest noted minimal tiny areas of ground glass opacities in both upper lobes, notable for air trapping. No infiltrates noted.   With the addition of steroids and nebs his lung exam appears much improved. His peak pressures today are lower suggesting better airflow.  -Full vent support -LTVV, 4-8cc/kg IBW with goal  Pplat<30 and DP<15 - Daily evaluation for SBT and extubation - Daily evaluation for sedation weaning trials  - PAD protocol > wean down Prop and Fent with RASS goal -1 to 0  - reduce Solumedrol to 40 mg Q12 hours for today from 80 mg BID - continue Bronchodilator therapy > Brovana  nebulizer BID and PRN Duonebs    Nondisplaced bilateral anterior rib fractures 2/2 chest compressions - Lidocaine  patches and PRN tylenol    Cardiac:  OOH PEA cardiac arrest 2/2 hypoxic respiratory failure >  lactic acidosis and demand cardiac ischemia  Echo showed LVEF 55 to 60%, normal LV function, no RWMA, mildly reduced RV function  Trop 23 > 157  Lactic acid 3.1 > 2.0 , improved after IVF  Required pressors briefly > off levo since 12/11, maintaining maps > 65  - telemetry   GI:  Elevated liver enzymes 2/2 shock state above  AST 237, ALT 294, T Bili 1.5  >> improved to 75 and 176. Bili normalized  Avoid hepatotoxins - Trend liver function  Bowel: Colace, miralax   Nutrition: OGT, RD consulted to start TF. Continue tube feeds   Renal:  No AKI, UOP 1.3L , net +766  - Monitor Daily I/Os - Maintain MAP>65 for optimal renal perfusion.  - Avoid nephrotoxic agents such as IV contrast, NSAIDs, and phosphate containing bowel preps (FLEETS)  ENDO: No hx of diabetes  CBG within goal  Continue sensitive SSI    HEME/ONC Normocytic anemia Hgb 11.2 (10.8), no signs and symptoms of bleeding    DVT prophylaxis: Lovenox    MSK:  PT/OT when extubated    Labs   CBC: Recent Labs  Lab 12/24/23 1242 12/24/23 1252 12/24/23 1336 12/25/23 0355 12/26/23 0402  WBC 8.6  --   --  7.6 7.2  HGB 10.8* 11.2*  11.2* 12.6* 11.2* 12.1*  HCT 33.1* 33.0*  33.0* 37.0* 33.2* 34.2*  MCV 89.0  --   --  85.6 83.8  PLT 187  --   --  189 188    Basic Metabolic Panel: Recent Labs  Lab 12/24/23 1242 12/24/23 1252 12/24/23 1336 12/25/23 0355 12/26/23 0402  NA 139 140  139 139 136 138  K 4.1 4.1  4.1 3.4* 3.7 3.6  CL 103 103  --  106 108  CO2 25  --   --  21* 21*  GLUCOSE 124* 123*  --  91 134*  BUN 14 17  --  16 19  CREATININE 1.07 1.20  --  0.85 0.81  CALCIUM 8.4*  --   --  8.3* 8.5*  MG 1.9  --   --  1.8 2.1  PHOS 4.0  --   --  2.8 1.9*   GFR: Estimated Creatinine Clearance: 72.3 mL/min (by C-G formula based on SCr of 0.81 mg/dL). Recent Labs  Lab 12/24/23 1242 12/24/23 1253 12/24/23 1551 12/25/23 0355 12/26/23 0402  WBC 8.6  --   --  7.6 7.2  LATICACIDVEN  --  3.1* 2.0*  --   --      Liver Function Tests: Recent Labs  Lab 12/24/23 1242 12/26/23 0402  AST 237* 75*  ALT 294* 176*  ALKPHOS 81 79  BILITOT 1.5* 0.6  PROT 6.0* 6.3*  ALBUMIN 3.5 3.2*   No results for input(s): LIPASE, AMYLASE in the last 168 hours. No results for input(s): AMMONIA in the last 168 hours.  ABG    Component Value Date/Time   PHART 7.379 12/24/2023 1336   PCO2ART 42.1 12/24/2023  1336   PO2ART 612 (H) 12/24/2023 1336   HCO3 24.9 12/24/2023 1336   TCO2 26 12/24/2023 1336   ACIDBASEDEF 2.0 01/09/2014 0258   O2SAT 100 12/24/2023 1336     Coagulation Profile: Recent Labs  Lab 12/24/23 1242  INR 1.2    Cardiac Enzymes: No results for input(s): CKTOTAL, CKMB, CKMBINDEX, TROPONINI in the last 168 hours.  HbA1C: Hgb A1c MFr Bld  Date/Time Value Ref Range Status  12/26/2023 04:02 AM 4.8 4.8 - 5.6 % Final    Comment:    (NOTE) Diagnosis of Diabetes The following HbA1c ranges recommended by the American Diabetes Association (ADA) may be used as an aid in the diagnosis of diabetes mellitus.  Hemoglobin             Suggested A1C NGSP%              Diagnosis  <5.7                   Non Diabetic  5.7-6.4                Pre-Diabetic  >6.4                   Diabetic  <7.0                   Glycemic control for                       adults with diabetes.    01/10/2014 03:40 AM 6.0 (H) <5.7 % Final    Comment:    (NOTE)                                                                       According to the ADA Clinical Practice Recommendations for 2011, when HbA1c is used as a screening test:  >=6.5%   Diagnostic of Diabetes Mellitus           (if abnormal result is confirmed) 5.7-6.4%   Increased risk of developing Diabetes Mellitus References:Diagnosis and Classification of Diabetes Mellitus,Diabetes Care,2011,34(Suppl 1):S62-S69 and Standards of Medical Care in         Diabetes - 2011,Diabetes Care,2011,34 (Suppl 1):S11-S61.     CBG: Recent  Labs  Lab 12/25/23 1523 12/25/23 2033 12/25/23 2355 12/26/23 0345 12/26/23 0749  GLUCAP 93 96 118* 124* 158*    Review of Systems:   As above   Past Medical History:  He,  has a past medical history of Asthma, Hypertension, and Seasonal allergies.   Surgical History:  History reviewed. No pertinent surgical history.   Social History:   reports that he has never smoked. He has never used smokeless tobacco. He reports that he does not drink alcohol  and does not use drugs.   Family History:  His family history is not on file.   Allergies Allergies[1]   Home Medications  Prior to Admission medications  Medication Sig Start Date End Date Taking? Authorizing Provider  albuterol  (PROAIR  HFA) 108 (90 Base) MCG/ACT inhaler Inhale 1-2 puffs into the lungs every 4 (four) hours as needed for wheezing or shortness of breath. 01/06/22   Alexander, Natalie, DO  amLODipine  (NORVASC ) 10 MG tablet Take  10 mg by mouth daily.    [provider]  calcium carbonate (TUMS - DOSED IN MG ELEMENTAL CALCIUM) 500 MG chewable tablet Chew 1 tablet by mouth daily as needed for indigestion or heartburn.    [provider]  guaiFENesin  (MUCINEX ) 600 MG 12 hr tablet Take 1 tablet (600 mg total) by mouth 2 (two) times daily as needed for cough or to loosen phlegm. 01/06/22   Alexander, Natalie, DO  hydrochlorothiazide  (HYDRODIURIL ) 12.5 MG tablet Take 12.5 mg by mouth daily. 11/06/15   [provider]  ipratropium-albuterol  (DUONEB) 0.5-2.5 (3) MG/3ML SOLN Take 3 mLs by nebulization 2 (two) times daily as needed (/wheezing not helped by albuterol  inhaler). 01/06/22   Alexander, Natalie, DO    The patient is critically ill due to acute hypoxic respiratory failure requiring mechanical ventilation and vent titration.  Critical care was necessary to treat or prevent imminent or life-threatening deterioration. Critical care time was spent by me on the following activities: development of a  treatment plan with the patient and/or surrogate as well as nursing, discussions with consultants, evaluation of the patient's response to treatment, examination of the patient, obtaining a history from the patient or surrogate, ordering and performing treatments and interventions, ordering and review of laboratory studies, ordering and review of radiographic studies, review of telemetry data including pulse oximetry, re-evaluation of patient's condition and participation in multidisciplinary rounds.   I personally spent 34 minutes providing critical care not including any separately billable procedures.   Zola LOISE Herter, MD [facility] 12/26/2023 8:09 AM             [1]  Allergies Allergen Reactions   Ace Inhibitors Swelling    In the face   Azelastine Hcl Swelling

## 2023-12-26 NOTE — Progress Notes (Signed)
 Patient had a single band ring gold in color on middle finger left hand. Ring was taken off with permission and given to daughter in law- Cheryl lynne. After confirming with patient's wife Trenden Hazelrigg she now has the ring.

## 2023-12-27 DIAGNOSIS — I469 Cardiac arrest, cause unspecified: Secondary | ICD-10-CM | POA: Diagnosis not present

## 2023-12-27 DIAGNOSIS — J45901 Unspecified asthma with (acute) exacerbation: Secondary | ICD-10-CM | POA: Diagnosis not present

## 2023-12-27 DIAGNOSIS — J9601 Acute respiratory failure with hypoxia: Secondary | ICD-10-CM | POA: Diagnosis not present

## 2023-12-27 DIAGNOSIS — J690 Pneumonitis due to inhalation of food and vomit: Secondary | ICD-10-CM | POA: Diagnosis not present

## 2023-12-27 DIAGNOSIS — R7401 Elevation of levels of liver transaminase levels: Secondary | ICD-10-CM | POA: Diagnosis not present

## 2023-12-27 DIAGNOSIS — G9340 Encephalopathy, unspecified: Secondary | ICD-10-CM | POA: Diagnosis not present

## 2023-12-27 DIAGNOSIS — I214 Non-ST elevation (NSTEMI) myocardial infarction: Secondary | ICD-10-CM | POA: Diagnosis not present

## 2023-12-27 LAB — BASIC METABOLIC PANEL WITH GFR
Anion gap: 12 (ref 5–15)
BUN: 38 mg/dL — ABNORMAL HIGH (ref 8–23)
CO2: 22 mmol/L (ref 22–32)
Calcium: 8.4 mg/dL — ABNORMAL LOW (ref 8.9–10.3)
Chloride: 106 mmol/L (ref 98–111)
Creatinine, Ser: 0.9 mg/dL (ref 0.61–1.24)
GFR, Estimated: >60 mL/min (ref >60)
Glucose, Bld: 158 mg/dL — ABNORMAL HIGH (ref 70–99)
Potassium: 3.9 mmol/L (ref 3.5–5.1)
Sodium: 140 mmol/L (ref 135–145)

## 2023-12-27 LAB — GLUCOSE, CAPILLARY
Glucose-Capillary: 138 mg/dL — ABNORMAL HIGH (ref 70–99)
Glucose-Capillary: 129 mg/dL — ABNORMAL HIGH (ref 70–99)
Glucose-Capillary: 126 mg/dL — ABNORMAL HIGH (ref 70–99)
Glucose-Capillary: 114 mg/dL — ABNORMAL HIGH (ref 70–99)

## 2023-12-27 LAB — CBC
HCT: 35.3 % — ABNORMAL LOW (ref 39.0–52.0)
Hemoglobin: 11.9 g/dL — ABNORMAL LOW (ref 13.0–17.0)
MCH: 28.7 pg (ref 26.0–34.0)
MCHC: 33.7 g/dL (ref 30.0–36.0)
MCV: 85.1 fL (ref 80.0–100.0)
Platelets: 185 K/uL (ref 150–400)
RBC: 4.15 MIL/uL — ABNORMAL LOW (ref 4.22–5.81)
RDW: 14.9 % (ref 11.5–15.5)
WBC: 8.6 K/uL (ref 4.0–10.5)
nRBC: 0 % (ref 0.0–0.2)

## 2023-12-27 LAB — PHOSPHORUS: Phosphorus: 3.2 mg/dL (ref 2.5–4.6)

## 2023-12-27 LAB — MAGNESIUM: Magnesium: 2.1 mg/dL (ref 1.7–2.4)

## 2023-12-27 MED ORDER — METHYLPREDNISOLONE SODIUM SUCC 40 MG IJ SOLR
40.0000 mg | Freq: Every day | INTRAMUSCULAR | Status: DC
  Administered 2023-12-27: 40 mg via INTRAVENOUS

## 2023-12-27 MED ORDER — MORPHINE 100MG IN NS 100ML (1MG/ML) PREMIX INFUSION
0.0000 mg/h | INTRAVENOUS | Status: DC
  Administered 2023-12-27: 5 mg/h via INTRAVENOUS
  Administered 2023-12-28 (×2): 10 mg/h via INTRAVENOUS
  Filled 2023-12-27 (×3): qty 100

## 2023-12-27 MED ORDER — GLYCOPYRROLATE 0.2 MG/ML IJ SOLN: 0.2000 mg | INTRAMUSCULAR | Status: DC | PRN

## 2023-12-27 MED ORDER — SODIUM CHLORIDE 0.9 % IV SOLN: INTRAVENOUS | Status: AC

## 2023-12-27 MED ORDER — ACETAMINOPHEN 325 MG PO TABS: 650.0000 mg | ORAL_TABLET | Freq: Four times a day (QID) | ORAL | Status: DC | PRN

## 2023-12-27 MED ORDER — GLYCOPYRROLATE 1 MG PO TABS: 1.0000 mg | ORAL_TABLET | ORAL | Status: DC | PRN

## 2023-12-27 MED ORDER — POLYVINYL ALCOHOL 1.4 % OP SOLN: 1.0000 [drp] | Freq: Four times a day (QID) | OPHTHALMIC | Status: DC | PRN

## 2023-12-27 MED ORDER — LABETALOL HCL 5 MG/ML IV SOLN
10.0000 mg | INTRAVENOUS | Status: DC | PRN
  Administered 2023-12-27 (×2): 10 mg via INTRAVENOUS
  Filled 2023-12-27 (×2): qty 4

## 2023-12-27 MED ORDER — MIDAZOLAM HCL (PF) 2 MG/2ML IJ SOLN: 2.0000 mg | INTRAMUSCULAR | Status: DC | PRN

## 2023-12-27 MED ORDER — GLYCOPYRROLATE 0.2 MG/ML IJ SOLN
0.2000 mg | INTRAMUSCULAR | Status: DC | PRN
  Administered 2023-12-28: 20:00:00 0.2 mg via INTRAVENOUS
  Filled 2023-12-27: qty 1

## 2023-12-27 MED ORDER — METHYLPREDNISOLONE SODIUM SUCC 40 MG IJ SOLR: 40.0000 mg | Freq: Every day | INTRAMUSCULAR | Status: DC

## 2023-12-27 MED ORDER — ACETAMINOPHEN 650 MG RE SUPP: 650.0000 mg | Freq: Four times a day (QID) | RECTAL | Status: DC | PRN

## 2023-12-27 MED ORDER — MORPHINE BOLUS VIA INFUSION
5.0000 mg | INTRAVENOUS | Status: DC | PRN
  Administered 2023-12-27: 5 mg via INTRAVENOUS

## 2023-12-27 MED ORDER — HYDRALAZINE HCL 20 MG/ML IJ SOLN
10.0000 mg | INTRAMUSCULAR | Status: DC | PRN
  Administered 2023-12-27: 10 mg via INTRAVENOUS
  Filled 2023-12-27: qty 1

## 2023-12-27 NOTE — Progress Notes (Signed)
 eLink Physician-Brief Progress Note Patient Name: [name] [name] DOB: [birth year] MRN: [id number]   Date of Service  12/27/2023  HPI/Events of Note  Patient with episodes of agitation and increased BP, on Fent 75 (+) bolus and BP still 150-180's  and no PRNs for HTN. Also on Precedex  at 0.4 but HR in 50's.  Currently seen asleep BP 128/61  HR 57  eICU Interventions  Hydralazine  10 mg IV prn ordered for SBP > 160     Intervention Category Intermediate Interventions: Hypertension - evaluation and management Minor Interventions: Agitation / anxiety - evaluation and management  Damien DASEN Shalece Staffa 12/27/2023, 1:17 AM

## 2023-12-27 NOTE — Procedures (Signed)
 Extubation Procedure Note  Patient Details:   Name: ELLISON RIETH DOB: 06/04/1940 MRN: [id number]   Airway Documentation:    Vent end date: 12/27/23 Vent end time: 1123   Evaluation  O2 sats: stable throughout Complications: No apparent complications Patient did tolerate procedure well. Bilateral Breath Sounds: Diminished   No, unable to speak. MD aware.  RT extubated patient to BIPAP per MD order. RN at bedside.  Laurell KANDICE Rodriguez 12/27/2023, 11:29 AM

## 2023-12-27 NOTE — Progress Notes (Addendum)
 Brief PCCM progress note  Discussed with family at bedside postextubation, patient with poor mentation and lethargy.  Not able to clear his secretions properly.  Discussed potentially reintubation and family would like to avoid that at this time.  They preferred to continue with BiPAP trial knowing that there is a chance that he might decompensate.  Should he decompensate then they would like to focus on his comfort and transition his care to comfort measures only.  This was discussed with his entire family at bedside and they are all in agreement.  All questions and concerns were answered.  Discussed with his nurse about tracheal suctioning only as needed for comfort if he is in any distress.  His CODE STATUS was changed to DNR/DNI to reflect his family's wishes.  Addendum 4:55 PM: Patient noted to have desaturations and his oxygen down to 83%.  Discussed with family again decompensation.  They would like to pursue comfort measures only.  CODE STATUS changed to comfort and orders placed for morphine  infusion and Versed  as needed.  Zola Herter, MD [facility] Office: [phone]   See Amion for personal pager PCCM on call pager [phone] until 7pm. Please call Elink 7p-7a. [phone]

## 2023-12-27 NOTE — Progress Notes (Signed)
 Pt comfortable in bed surrounded by family. Will continue comfort measures.

## 2023-12-27 NOTE — Progress Notes (Signed)
 NAME:  [name] [name], MRN:  [id number], DOB:  Aug 30, 1940, LOS: 3 ADMISSION DATE:  12/24/2023, CONSULTATION DATE:  12/24/2023 REFERRING MD:  Dr. Yolande, CHIEF COMPLAINT:  cardiac arrest    History of Present Illness:  HPI obtained from EMR review as patient is encephalopathic.    34 yoM with PMH significant for never smoker, asthma, HTN, HLD, malnutrition, seasonal allergies who presented by EMS after cardiac arrest.  Per chart, pt had called 911 around 11:18 himself c/o difficulty breathing but stopped responding to them.  Found by fire department slumped over in chair with nebulizer going, CPR initiated at 1125, found to be in PEA with ROSC obtained at 1132 after 1 epi.   On ER arrival, SBP in 70's and started on vasopressor support.    Pertinent  Medical History   Past Medical History:  Diagnosis Date   Asthma    Hypertension    Seasonal allergies     Significant Hospital Events: Including procedures, antibiotic start and stop dates in addition to other pertinent events   12/11 OOH cardiac arrest 2/2 respiratory failure  12/12 > transitioned off cefepime  to CTX, stop vanc. Start steroids and bronchodilators   Interim History / Subjective:  When sedation is weaned, the patient is able to track with his eyes slowly, intermittently follows commands.  Appears comfortable without any acute distress off sedation.  Noted to be hypertensive  Objective    Blood pressure 110/69, pulse (!) 52, temperature 97.7 F (36.5 C), resp. rate 18, height 5' 11 (1.803 m), weight 79.4 kg, SpO2 100%.    Vent Mode: PRVC FiO2 (%):  [40 %] 40 % Set Rate:  [18 bmp] 18 bmp Vt Set:  [600 mL] 600 mL PEEP:  [5 cmH20] 5 cmH20 Plateau Pressure:  [14 cmH20-18 cmH20] 18 cmH20   Intake/Output Summary (Last 24 hours) at 12/27/2023 0745 Last data filed at 12/27/2023 0700 Gross per 24 hour  Intake 1602.03 ml  Output 605 ml  Net 997.03 ml   Filed Weights   12/25/23 0326 12/26/23 0500 12/27/23 0448   Weight: 79 kg 77.8 kg 79.4 kg    Examination: General: Ill-appearing elderly male, intubated, encephalopathic but opens eyes and tracks HENT: 3 mm pupils, PERRLA Lungs: ETT in place, coarse ventilator breath sounds, OG tube in place Cardiovascular: RRR, normal S1, S2, no MRG Abdomen:, Nontender, nondistended Extremities: Bilateral foot deformities, warm to touch, no edema Neuro: Sedation, opens eyes, able to wiggle toes, able to lift left arm but not right arm. GU: Foley     Resolved problem list   Assessment and Plan   Neuro: Acute encephalopathy postcardiac arrest Tox screen only for benzos likely during  intubation.  I suspect cardiac arrest in the setting of asthma exacerbation and hypoxia.  CT head negative however will consider MRI. - Continue sedation holiday and SBT trial if possible.  SBT in the morning while still sleepy, failed as he had episodes of apnea - Spot EEG with acute encephalopathy  Respiratory:  Acute Hypoxic Respiratory Failure requiring intubation 12/11 prior to ED arrival  Acute Asthma Exacerbation  Aspiration pneumonitis  Asthma exacerbation much improved.  CT PE ruled out PE.  SBT trial failed today due to apnea will continue to wean sedation. - Continue current full vent support --Full vent support -LTVV, 4-8cc/kg IBW with goal Pplat<30 and DP<15 - Daily evaluation for SBT and extubation - Daily evaluation for sedation weaning trials  -Reduce Solu-Medrol  to 40 mg daily - Continue Brovana  nebs  and albuterol  as needed  Nondisplaced bilateral anterior rib fractures 2/2 chest compressions - Conservative management with lidocaine  and as needed Tylenol   Cardiac:  OOH PEA cardiac arrest 2/2 hypoxic respiratory failure > lactic acidosis and demand cardiac ischemia  Echo showed LVEF 55 to 60%, normal LV function except for basal septal hypokinesis, no RWMA, mildly reduced RV function  Trop 23 > 157  Lactic acid 3.1 > 2.0 , improved after IVF  Off  pressors - telemetry   GI:  Elevated liver enzymes 2/2 shock state above  Liver enzyme elevation in the setting of shock significantly improved. Avoid hepatotoxins - Trend liver function  Bowel: Colace, miralax   Nutrition: Continue tube feeds  Renal:  No AKI, UOP 1.3L , net +766  - Monitor Daily I/Os - Maintain MAP>65 for optimal renal perfusion.  - Avoid nephrotoxic agents such as IV contrast, NSAIDs, and phosphate containing bowel preps (FLEETS) He continues to have normal kidney function-   ENDO: No hx of diabetes  CBG reviewed and within goals Continue sensitive SSI     HEME/ONC Normocytic anemia Hgb 11.2 (10.8), no signs and symptoms of bleeding    DVT prophylaxis: Lovenox    MSK:  PT/OT when extubated    Labs   CBC: Recent Labs  Lab 12/24/23 1242 12/24/23 1252 12/24/23 1336 12/25/23 0355 12/26/23 0402 12/27/23 0324  WBC 8.6  --   --  7.6 7.2 8.6  HGB 10.8* 11.2*  11.2* 12.6* 11.2* 12.1* 11.9*  HCT 33.1* 33.0*  33.0* 37.0* 33.2* 34.2* 35.3*  MCV 89.0  --   --  85.6 83.8 85.1  PLT 187  --   --  189 188 185    Basic Metabolic Panel: Recent Labs  Lab 12/24/23 1242 12/24/23 1252 12/24/23 1336 12/25/23 0355 12/26/23 0402 12/27/23 0324  NA 139 140  139 139 136 138 140  K 4.1 4.1  4.1 3.4* 3.7 3.6 3.9  CL 103 103  --  106 108 106  CO2 25  --   --  21* 21* 22  GLUCOSE 124* 123*  --  91 134* 158*  BUN 14 17  --  16 19 38*  CREATININE 1.07 1.20  --  0.85 0.81 0.90  CALCIUM 8.4*  --   --  8.3* 8.5* 8.4*  MG 1.9  --   --  1.8 2.1 2.1  PHOS 4.0  --   --  2.8 1.9* 3.2   GFR: Estimated Creatinine Clearance: 65.1 mL/min (by C-G formula based on SCr of 0.9 mg/dL). Recent Labs  Lab 12/24/23 1242 12/24/23 1253 12/24/23 1551 12/25/23 0355 12/26/23 0402 12/27/23 0324  WBC 8.6  --   --  7.6 7.2 8.6  LATICACIDVEN  --  3.1* 2.0*  --   --   --     Liver Function Tests: Recent Labs  Lab 12/24/23 1242 12/26/23 0402  AST 237* 75*  ALT 294* 176*   ALKPHOS 81 79  BILITOT 1.5* 0.6  PROT 6.0* 6.3*  ALBUMIN 3.5 3.2*   No results for input(s): LIPASE, AMYLASE in the last 168 hours. No results for input(s): AMMONIA in the last 168 hours.  ABG    Component Value Date/Time   PHART 7.379 12/24/2023 1336   PCO2ART 42.1 12/24/2023 1336   PO2ART 612 (H) 12/24/2023 1336   HCO3 24.9 12/24/2023 1336   TCO2 26 12/24/2023 1336   ACIDBASEDEF 2.0 01/09/2014 0258   O2SAT 100 12/24/2023 1336     Coagulation Profile: Recent Labs  Lab 12/24/23 1242  INR 1.2    Cardiac Enzymes: No results for input(s): CKTOTAL, CKMB, CKMBINDEX, TROPONINI in the last 168 hours.  HbA1C: Hgb A1c MFr Bld  Date/Time Value Ref Range Status  12/26/2023 04:02 AM 4.8 4.8 - 5.6 % Final    Comment:    (NOTE) Diagnosis of Diabetes The following HbA1c ranges recommended by the American Diabetes Association (ADA) may be used as an aid in the diagnosis of diabetes mellitus.  Hemoglobin             Suggested A1C NGSP%              Diagnosis  <5.7                   Non Diabetic  5.7-6.4                Pre-Diabetic  >6.4                   Diabetic  <7.0                   Glycemic control for                       adults with diabetes.    01/10/2014 03:40 AM 6.0 (H) <5.7 % Final    Comment:    (NOTE)                                                                       According to the ADA Clinical Practice Recommendations for 2011, when HbA1c is used as a screening test:  >=6.5%   Diagnostic of Diabetes Mellitus           (if abnormal result is confirmed) 5.7-6.4%   Increased risk of developing Diabetes Mellitus References:Diagnosis and Classification of Diabetes Mellitus,Diabetes Care,2011,34(Suppl 1):S62-S69 and Standards of Medical Care in         Diabetes - 2011,Diabetes Care,2011,34 (Suppl 1):S11-S61.     CBG: Recent Labs  Lab 12/26/23 1526 12/26/23 1927 12/26/23 2317 12/27/23 0321 12/27/23 0721  GLUCAP 152* 166* 158*  138* 129*    Review of Systems:   As above   Past Medical History:  He,  has a past medical history of Asthma, Hypertension, and Seasonal allergies.   Surgical History:  History reviewed. No pertinent surgical history.   Social History:   reports that he has never smoked. He has never used smokeless tobacco. He reports that he does not drink alcohol  and does not use drugs.   Family History:  His family history is not on file.   Allergies Allergies[1]   Home Medications  Prior to Admission medications  Medication Sig Start Date End Date Taking? Authorizing Provider  albuterol  (PROAIR  HFA) 108 (90 Base) MCG/ACT inhaler Inhale 1-2 puffs into the lungs every 4 (four) hours as needed for wheezing or shortness of breath. 01/06/22   Alexander, Natalie, DO  amLODipine  (NORVASC ) 10 MG tablet Take 10 mg by mouth daily.    [provider]  calcium carbonate (TUMS - DOSED IN MG ELEMENTAL CALCIUM) 500 MG chewable tablet Chew 1 tablet by mouth daily as needed for indigestion or heartburn.    [provider]  guaiFENesin  (MUCINEX ) 600 MG 12 hr tablet Take 1 tablet (600 mg total) by mouth 2 (two) times daily as needed for cough or to loosen phlegm. 01/06/22   Alexander, Natalie, DO  hydrochlorothiazide  (HYDRODIURIL ) 12.5 MG tablet Take 12.5 mg by mouth daily. 11/06/15   [provider]  ipratropium-albuterol  (DUONEB) 0.5-2.5 (3) MG/3ML SOLN Take 3 mLs by nebulization 2 (two) times daily as needed (/wheezing not helped by albuterol  inhaler). 01/06/22   Alexander, Natalie, DO    The patient is critically ill due to cardiac arrest with acute hypoxic respiratory failure in the setting of asthma exacerbation.  Critical care was necessary to treat or prevent imminent or life-threatening deterioration. Critical care time was spent by me on the following activities: development of a treatment plan with the patient and/or surrogate as well as nursing, discussions with consultants,  evaluation of the patient's response to treatment, examination of the patient, obtaining a history from the patient or surrogate, ordering and performing treatments and interventions, ordering and review of laboratory studies, ordering and review of radiographic studies, review of telemetry data including pulse oximetry, re-evaluation of patient's condition and participation in multidisciplinary rounds.   I personally spent 43 minutes providing critical care not including any separately billable procedures.   Zola LOISE Herter, MD [facility] 12/27/2023 7:51 AM            [1]  Allergies Allergen Reactions   Ace Inhibitors Swelling    In the face   Azelastine Hcl Swelling

## 2023-12-28 DIAGNOSIS — J9601 Acute respiratory failure with hypoxia: Secondary | ICD-10-CM | POA: Diagnosis not present

## 2023-12-28 DIAGNOSIS — R7401 Elevation of levels of liver transaminase levels: Secondary | ICD-10-CM | POA: Diagnosis not present

## 2023-12-28 DIAGNOSIS — G9340 Encephalopathy, unspecified: Secondary | ICD-10-CM | POA: Diagnosis not present

## 2023-12-28 DIAGNOSIS — J45901 Unspecified asthma with (acute) exacerbation: Secondary | ICD-10-CM | POA: Diagnosis not present

## 2023-12-28 DIAGNOSIS — I214 Non-ST elevation (NSTEMI) myocardial infarction: Secondary | ICD-10-CM | POA: Diagnosis not present

## 2023-12-28 DIAGNOSIS — J690 Pneumonitis due to inhalation of food and vomit: Secondary | ICD-10-CM | POA: Diagnosis not present

## 2023-12-28 DIAGNOSIS — I469 Cardiac arrest, cause unspecified: Secondary | ICD-10-CM | POA: Diagnosis not present

## 2023-12-29 ENCOUNTER — Ambulatory Visit: Admitting: Podiatry

## 2023-12-29 LAB — CULTURE, BLOOD (ROUTINE X 2)
Culture: NO GROWTH
Culture: NO GROWTH

## 2023-12-29 NOTE — Progress Notes (Signed)
 1. Deceased

## 2024-01-14 NOTE — Progress Notes (Signed)
 Notified of expected death. OK for RN to declare  [name] [name]. Jude MD

## 2024-01-14 NOTE — Progress Notes (Signed)
 NAME:  [name] [name], MRN:  [id number], DOB:  1940/02/12, LOS: 4 ADMISSION DATE:  12/24/2023, CONSULTATION DATE:  12/24/2023 REFERRING MD:  Dr. Yolande, CHIEF COMPLAINT:  cardiac arrest    History of Present Illness:  HPI obtained from EMR review as patient is encephalopathic.    78 yoM with PMH significant for never smoker, asthma, HTN, HLD, malnutrition, seasonal allergies who presented by EMS after cardiac arrest.  Per chart, pt had called 911 around 11:18 himself c/o difficulty breathing but stopped responding to them.  Found by fire department slumped over in chair with nebulizer going, CPR initiated at 1125, found to be in PEA with ROSC obtained at 1132 after 1 epi.   On ER arrival, SBP in 70's and started on vasopressor support.    Pertinent  Medical History   Past Medical History:  Diagnosis Date   Asthma    Hypertension    Seasonal allergies     Significant Hospital Events: Including procedures, antibiotic start and stop dates in addition to other pertinent events   12/11 OOH cardiac arrest 2/2 respiratory failure  12/12 > transitioned off cefepime  to CTX, stop vanc. Start steroids and bronchodilators  12/14.  Switch to comfort measures Interim History / Subjective:  When sedation is weaned, the patient is able to track with his eyes slowly, intermittently follows commands.  Appears comfortable without any acute distress off sedation.  Noted to be hypertensive  Objective    Blood pressure (!) 141/73, pulse 79, temperature 98.4 F (36.9 C), resp. rate (!) 4, height 5' 11 (1.803 m), weight 79.4 kg, SpO2 (!) 86%.    Vent Mode: PCV;BIPAP FiO2 (%):  [40 %] 40 % Set Rate:  [15 bmp] 15 bmp PEEP:  [5 cmH20] 5 cmH20   Intake/Output Summary (Last 24 hours) at January 21, 2024 1359 Last data filed at 01/21/2024 0700 Gross per 24 hour  Intake 390.8 ml  Output 200 ml  Net 190.8 ml   Filed Weights   12/25/23 0326 12/26/23 0500 12/27/23 0448  Weight: 79 kg 77.8 kg 79.4 kg     Examination: General: Ill-appearing male, unresponsive, comfortable appearing Lungs: Slow breathing without any acute distress   Resolved problem list   Assessment and Plan   Acute encephalopathy postcardiac arrest Acute Hypoxic Respiratory Failure requiring intubation 12/11 prior to ED arrival  Acute Asthma Exacerbation  Aspiration pneumonitis  Nondisplaced bilateral anterior rib fractures 2/2 chest compressions OOH PEA cardiac arrest 2/2 hypoxic respiratory failure > lactic acidosis and demand cardiac ischemia    Yesterday postextubation, patient with poor mental status not able to clear secretions.  Goals of care discussed with family and they elected to pursue comfort measures only.  He appears comfortable on the current comfort medications.  Plan to transfer to the floor   Labs   CBC: Recent Labs  Lab 12/24/23 1242 12/24/23 1252 12/24/23 1336 12/25/23 0355 12/26/23 0402 12/27/23 0324  WBC 8.6  --   --  7.6 7.2 8.6  HGB 10.8* 11.2*  11.2* 12.6* 11.2* 12.1* 11.9*  HCT 33.1* 33.0*  33.0* 37.0* 33.2* 34.2* 35.3*  MCV 89.0  --   --  85.6 83.8 85.1  PLT 187  --   --  189 188 185    Basic Metabolic Panel: Recent Labs  Lab 12/24/23 1242 12/24/23 1252 12/24/23 1336 12/25/23 0355 12/26/23 0402 12/27/23 0324  NA 139 140  139 139 136 138 140  K 4.1 4.1  4.1 3.4* 3.7 3.6 3.9  CL 103  103  --  106 108 106  CO2 25  --   --  21* 21* 22  GLUCOSE 124* 123*  --  91 134* 158*  BUN 14 17  --  16 19 38*  CREATININE 1.07 1.20  --  0.85 0.81 0.90  CALCIUM 8.4*  --   --  8.3* 8.5* 8.4*  MG 1.9  --   --  1.8 2.1 2.1  PHOS 4.0  --   --  2.8 1.9* 3.2   GFR: Estimated Creatinine Clearance: 65.1 mL/min (by C-G formula based on SCr of 0.9 mg/dL). Recent Labs  Lab 12/24/23 1242 12/24/23 1253 12/24/23 1551 12/25/23 0355 12/26/23 0402 12/27/23 0324  WBC 8.6  --   --  7.6 7.2 8.6  LATICACIDVEN  --  3.1* 2.0*  --   --   --     Liver Function Tests: Recent Labs  Lab  12/24/23 1242 12/26/23 0402  AST 237* 75*  ALT 294* 176*  ALKPHOS 81 79  BILITOT 1.5* 0.6  PROT 6.0* 6.3*  ALBUMIN 3.5 3.2*   No results for input(s): LIPASE, AMYLASE in the last 168 hours. No results for input(s): AMMONIA in the last 168 hours.  ABG    Component Value Date/Time   PHART 7.379 12/24/2023 1336   PCO2ART 42.1 12/24/2023 1336   PO2ART 612 (H) 12/24/2023 1336   HCO3 24.9 12/24/2023 1336   TCO2 26 12/24/2023 1336   ACIDBASEDEF 2.0 01/09/2014 0258   O2SAT 100 12/24/2023 1336     Coagulation Profile: Recent Labs  Lab 12/24/23 1242  INR 1.2    Cardiac Enzymes: No results for input(s): CKTOTAL, CKMB, CKMBINDEX, TROPONINI in the last 168 hours.  HbA1C: Hgb A1c MFr Bld  Date/Time Value Ref Range Status  12/26/2023 04:02 AM 4.8 4.8 - 5.6 % Final    Comment:    (NOTE) Diagnosis of Diabetes The following HbA1c ranges recommended by the American Diabetes Association (ADA) may be used as an aid in the diagnosis of diabetes mellitus.  Hemoglobin             Suggested A1C NGSP%              Diagnosis  <5.7                   Non Diabetic  5.7-6.4                Pre-Diabetic  >6.4                   Diabetic  <7.0                   Glycemic control for                       adults with diabetes.    01/10/2014 03:40 AM 6.0 (H) <5.7 % Final    Comment:    (NOTE)                                                                       According to the ADA Clinical Practice Recommendations for 2011, when HbA1c is used as a screening test:  >=6.5%   Diagnostic of  Diabetes Mellitus           (if abnormal result is confirmed) 5.7-6.4%   Increased risk of developing Diabetes Mellitus References:Diagnosis and Classification of Diabetes Mellitus,Diabetes Care,2011,34(Suppl 1):S62-S69 and Standards of Medical Care in         Diabetes - 2011,Diabetes Care,2011,34 (Suppl 1):S11-S61.     CBG: Recent Labs  Lab 12/26/23 2317 12/27/23 0321  12/27/23 0721 12/27/23 1139 12/27/23 1540  GLUCAP 158* 138* 129* 126* 114*    Review of Systems:   As above   Past Medical History:  He,  has a past medical history of Asthma, Hypertension, and Seasonal allergies.   Surgical History:  History reviewed. No pertinent surgical history.   Social History:   reports that he has never smoked. He has never used smokeless tobacco. He reports that he does not drink alcohol  and does not use drugs.   Family History:  His family history is not on file.   Allergies Allergies[1]   Home Medications  Prior to Admission medications  Medication Sig Start Date End Date Taking? Authorizing Provider  albuterol  (PROAIR  HFA) 108 (90 Base) MCG/ACT inhaler Inhale 1-2 puffs into the lungs every 4 (four) hours as needed for wheezing or shortness of breath. 01/06/22   Alexander, Natalie, DO  amLODipine  (NORVASC ) 10 MG tablet Take 10 mg by mouth daily.    [provider]  calcium carbonate (TUMS - DOSED IN MG ELEMENTAL CALCIUM) 500 MG chewable tablet Chew 1 tablet by mouth daily as needed for indigestion or heartburn.    [provider]  guaiFENesin  (MUCINEX ) 600 MG 12 hr tablet Take 1 tablet (600 mg total) by mouth 2 (two) times daily as needed for cough or to loosen phlegm. 01/06/22   Alexander, Natalie, DO  hydrochlorothiazide  (HYDRODIURIL ) 12.5 MG tablet Take 12.5 mg by mouth daily. 11/06/15   [provider]  ipratropium-albuterol  (DUONEB) 0.5-2.5 (3) MG/3ML SOLN Take 3 mLs by nebulization 2 (two) times daily as needed (/wheezing not helped by albuterol  inhaler). 01/06/22   Alexander, Natalie, DO         [1]  Allergies Allergen Reactions   Ace Inhibitors Swelling    In the face   Azelastine Hcl Swelling

## 2024-01-14 NOTE — TOC Progression Note (Addendum)
 Transition of Care [facility]) - Progression Note    Patient Details  Name: [name] [name] MRN: [id number] Date of Birth: 1940/03/10  Transition of [facility]) CM/SW Contact  Lauraine FORBES Saa, LCSWA Phone Number: 01-15-2024, 12:02 PM  Clinical Narrative:     12:02 PM Per progressions, patient has transitioned to comfort care. TOC will continue to follow.  Expected Discharge Plan: Home/Self Care Barriers to Discharge: Continued Medical Work up               Expected Discharge Plan and Services       Living arrangements for the past 2 months: Single Family Home                                       Social Drivers of Health (SDOH) Interventions SDOH Screenings   Food Insecurity: Patient Unable To Answer (12/24/2023)  Housing: Unknown (12/24/2023)  Transportation Needs: Patient Unable To Answer (12/24/2023)  Utilities: Patient Unable To Answer (12/24/2023)  Social Connections: Unknown (12/24/2023)  Tobacco Use: Low Risk (12/24/2023)    Readmission Risk Interventions     No data to display

## 2024-01-14 NOTE — Plan of Care (Signed)
 Patient came in from other unit. Needs attended. On morphine  drip. Kept safe. Bed alarm on. Family at bedside. Problem: Clinical Measurements: Goal: Will remain free from infection Outcome: Progressing   Problem: Health Behavior/Discharge Planning: Goal: Ability to manage health-related needs will improve Outcome: Not Progressing   Problem: Clinical Measurements: Goal: Ability to maintain clinical measurements within normal limits will improve Outcome: Not Progressing

## 2024-01-14 NOTE — Progress Notes (Signed)
 Nutrition Brief Note  Chart reviewed. Pt now transitioning to comfort care.  No further nutrition interventions planned at this time.  Please re-consult as needed.   Vernell Lukes, RD, LDN, CNSC Registered Dietitian II Please reach out via secure chat

## 2024-01-14 NOTE — Progress Notes (Signed)
 24.5 mL of Morphine  wasted with Levorn Leeds, RN.

## 2024-01-14 NOTE — Progress Notes (Signed)
 Patient expired at 2058, second nurse verified Lyndell Leeds, RN). Family at the bedside.  Provider notified.

## 2024-01-14 NOTE — Death Summary Note (Signed)
 DEATH SUMMARY   Patient Details  Name: [name] [name] MRN: [id number] DOB: Nov 18, 1940 ERE:Cjmjijmjgjw, Valery, MD  Admission/Discharge Information   Admit Date:  January 23, 2024  Date of Death: Date of Death: 01-27-2024  Time of Death: Time of Death: 2057/02/02  Length of Stay: 4   Principle Cause of death: Severe asthma exacerbation leading to PEA cardiac arrest  Hospital Diagnoses: Principal Problem:   Cardiac arrest [facility]) Active Problems:   Protein-calorie malnutrition, severe   Hospital Course: This is an 84 year old male with a past medical history of asthma who has been having significant shortness of breath at home and patient called 911 due to difficulty breathing.  He was found by the fire department slumped over the chair and CPR was initiated when he was found in PEA arrest.  He was admitted to the hospital and intubated and sent to the ICU.  In the ICU he was noted to be extremely tight and wheezing so he was started on steroids and nebulizer treatments.  He was eventually extubated but postextubation was noted to be lethargic and altered with poor mental status and inability to clear secretions.  These findings were discussed with his family and they did not want him to be reintubated, he was continued on BiPAP but continued to deteriorate eventually family decided to pursue comfort measures and he was switched to comfort focused care only.  He passed away on 01/27/24 at 02-02-2057         Procedures: Intubation   The results of significant diagnostics from this hospitalization (including imaging, microbiology, ancillary and laboratory) are listed below for reference.   Significant Diagnostic Studies: EEG adult Result Date: 12/25/2023 Shelton Arlin KIDD, MD     12/25/2023  4:52 PM Patient Name: BURKE TERRY MRN: [id number] Epilepsy Attending: Arlin KIDD Shelton Referring Physician/Provider: Heddy Barren, DO Date: 12/25/2023 Duration: 25.09 mins Patient history: 84yo M  s/p cardiac arrest. EEG to evaluate for seizure Level of alertness:  comatose AEDs during EEG study: Propofol  Technical aspects: This EEG study was done with scalp electrodes positioned according to the 10-20 International system of electrode placement. Electrical activity was reviewed with band pass filter of 1-70Hz , sensitivity of 7 uV/mm, display speed of 50mm/sec with a 60Hz  notched filter applied as appropriate. EEG data were recorded continuously and digitally stored.  Video monitoring was available and reviewed as appropriate. Description: EEG showed continuous generalized 3 to 6 Hz theta-delta slowing. Hyperventilation and photic stimulation were not performed.   ABNORMALITY - Continuous slow, generalized IMPRESSION: This study is suggestive of generalized cerebral dysfunction ( encephalopathy). No seizures or epileptiform discharges were seen throughout the recording. Arlin KIDD Shelton   DG Abd Portable 1V Result Date: 2024/01/23 CLINICAL DATA:  NG placement. EXAM: PORTABLE ABDOMEN - 1 VIEW COMPARISON:  Earlier radiograph dated 01-23-2024. FINDINGS: Interval advancement of the enteric tube with side-port just below the diaphragm and tip in the left upper abdomen likely in the proximal stomach. IMPRESSION: Enteric tube with tip in the proximal stomach. Electronically Signed   By: Vanetta Chou M.D.   On: 2024-01-23 19:18   ECHOCARDIOGRAM COMPLETE Result Date: 01/23/2024    ECHOCARDIOGRAM REPORT   Patient Name:   KHAMRON GELLERT Date of Exam: 2024/01/23 Medical Rec #:  [id number]       Height:       71.0 in Accession #:    [id number]      Weight:       174.2 lb Date of Birth:  1940-07-05  BSA:          1.988 m Patient Age:    83 years        BP:           174/111 mmHg Patient Gender: M               HR:           93 bpm. Exam Location:  Inpatient Procedure: 2D Echo (Both Spectral and Color Flow Doppler were utilized during            procedure). Indications:    Cardio Myopathy  History:         Patient has no prior history of Echocardiogram examinations.                 Cardiomyopathy.  Sonographer:    Norleen Amour Referring Phys: LAMAR BROCKS PATERSON IMPRESSIONS  1. Basal septal hypokinesis . Left ventricular ejection fraction, by estimation, is 55 to 60%. The left ventricle has normal function. The left ventricle has no regional wall motion abnormalities. Left ventricular diastolic parameters were normal.  2. Right ventricular systolic function is mildly reduced. The right ventricular size is mildly enlarged.  3. The mitral valve is abnormal. No evidence of mitral valve regurgitation. No evidence of mitral stenosis.  4. The aortic valve is tricuspid. There is moderate calcification of the aortic valve. Aortic valve regurgitation is trivial. Aortic valve sclerosis is present, with no evidence of aortic valve stenosis.  5. The inferior vena cava is normal in size with greater than 50% respiratory variability, suggesting right atrial pressure of 3 mmHg. FINDINGS  Left Ventricle: Basal septal hypokinesis. Left ventricular ejection fraction, by estimation, is 55 to 60%. The left ventricle has normal function. The left ventricle has no regional wall motion abnormalities. Strain was performed and the global longitudinal strain is indeterminate. The left ventricular internal cavity size was normal in size. There is no left ventricular hypertrophy. Left ventricular diastolic parameters were normal. Right Ventricle: The right ventricular size is mildly enlarged. No increase in right ventricular wall thickness. Right ventricular systolic function is mildly reduced. Left Atrium: Left atrial size was normal in size. Right Atrium: Right atrial size was normal in size. Pericardium: Trivial pericardial effusion is present. The pericardial effusion is lateral to the left ventricle. Mitral Valve: The mitral valve is abnormal. There is mild thickening of the mitral valve leaflet(s). There is mild calcification of the mitral  valve leaflet(s). Mild mitral annular calcification. No evidence of mitral valve regurgitation. No evidence of mitral valve stenosis. MV peak gradient, 4.2 mmHg. The mean mitral valve gradient is 1.0 mmHg. Tricuspid Valve: The tricuspid valve is normal in structure. Tricuspid valve regurgitation is trivial. No evidence of tricuspid stenosis. Aortic Valve: The aortic valve is tricuspid. There is moderate calcification of the aortic valve. Aortic valve regurgitation is trivial. Aortic valve sclerosis is present, with no evidence of aortic valve stenosis. Pulmonic Valve: The pulmonic valve was normal in structure. Pulmonic valve regurgitation is not visualized. No evidence of pulmonic stenosis. Aorta: The aortic root is normal in size and structure. Venous: The inferior vena cava is normal in size with greater than 50% respiratory variability, suggesting right atrial pressure of 3 mmHg. IAS/Shunts: No atrial level shunt detected by color flow Doppler. Additional Comments: 3D was performed not requiring image post processing on an independent workstation and was indeterminate.  LEFT VENTRICLE PLAX 2D LVIDd:         4.20 cm  Diastology LVIDs:         3.20 cm     LV e' medial:    18.80 cm/s LV PW:         0.80 cm     LV E/e' medial:  5.4 LV IVS:        0.80 cm     LV e' lateral:   19.10 cm/s LVOT diam:     2.00 cm     LV E/e' lateral: 5.3 LV SV:         34 LV SV Index:   17 LVOT Area:     3.14 cm  LV Volumes (MOD) LV vol d, MOD A2C: 36.8 ml LV vol d, MOD A4C: 54.6 ml LV vol s, MOD A2C: 17.0 ml LV vol s, MOD A4C: 19.3 ml LV SV MOD A2C:     19.8 ml LV SV MOD A4C:     54.6 ml LV SV MOD BP:      26.5 ml RIGHT VENTRICLE             IVC RV Basal diam:  3.60 cm     IVC diam: 1.20 cm RV S prime:     12.40 cm/s TAPSE (M-mode): 2.4 cm LEFT ATRIUM             Index        RIGHT ATRIUM           Index LA diam:        2.30 cm 1.16 cm/m   RA Area:     10.40 cm LA Vol (A2C):   43.6 ml 21.93 ml/m  RA Volume:   22.40 ml  11.27  ml/m LA Vol (A4C):   31.2 ml 15.69 ml/m LA Biplane Vol: 37.8 ml 19.01 ml/m  AORTIC VALVE             PULMONIC VALVE LVOT Vmax:   70.30 cm/s  PV Vmax:       1.14 m/s LVOT Vmean:  50.400 cm/s PV Peak grad:  5.2 mmHg LVOT VTI:    0.108 m  AORTA Ao Root diam: 2.80 cm Ao Asc diam:  3.10 cm MITRAL VALVE MV Area (PHT): 5.27 cm     SHUNTS MV Area VTI:   2.19 cm     Systemic VTI:  0.11 m MV Peak grad:  4.2 mmHg     Systemic Diam: 2.00 cm MV Mean grad:  1.0 mmHg MV Vmax:       1.02 m/s MV Vmean:      51.3 cm/s MV Decel Time: 144 msec MV E velocity: 102.00 cm/s MV A velocity: 39.80 cm/s MV E/A ratio:  2.56 Maude Emmer MD Electronically signed by Maude Emmer MD Signature Date/Time: 12/24/2023/3:33:58 PM    Final    CT HEAD WO CONTRAST Result Date: 12/24/2023 CLINICAL DATA:  Altered mental status. Recent cardiac arrest and CPR. EXAM: CT HEAD WITHOUT CONTRAST CT CERVICAL SPINE WITHOUT CONTRAST TECHNIQUE: Multidetector CT imaging of the head and cervical spine was performed following the standard protocol without intravenous contrast. Multiplanar CT image reconstructions of the cervical spine were also generated. RADIATION DOSE REDUCTION: This exam was performed according to the departmental dose-optimization program which includes automated exposure control, adjustment of the mA and/or kV according to patient size and/or use of iterative reconstruction technique. COMPARISON:  None Available. FINDINGS: CT HEAD FINDINGS Brain: No evidence of intracranial hemorrhage, acute infarction, hydrocephalus, extra-axial collection, or mass lesion/mass effect. Mild chronic small vessel disease is  noted. Vascular:  No hyperdense vessel or other acute findings. Skull: No evidence of fracture or other significant bone abnormality. Sinuses/Orbits: No acute findings. Partial opacification of bilateral maxillary, ethmoid, and left frontal sinuses is seen. Other: None. CT CERVICAL SPINE FINDINGS Alignment: Normal. Skull base and  vertebrae: No acute fracture. No primary bone lesion or focal pathologic process. Soft tissues and spinal canal: No prevertebral fluid or swelling. No visible canal hematoma. Disc levels: Moderate to severe degenerative disc disease is seen from levels of C2-C4, and C5-T1. Multilevel mild facet DJD is also seen bilaterally. 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 Upper chest: No acute findings. Other: None. IMPRESSION: No acute intracranial abnormality. Mild chronic small vessel disease.  Paranasal sinus disease. No evidence of acute cervical spine fracture or subluxation. Degenerative cervical spondylosis, as described above. 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 Electronically Signed   By: Norleen DELENA Kil M.D.   On: 12/24/2023 14:53   CT Cervical Spine Wo Contrast Result Date: 12/24/2023 CLINICAL DATA:  Altered mental status. Recent cardiac arrest and CPR. EXAM: CT HEAD WITHOUT CONTRAST CT CERVICAL SPINE WITHOUT CONTRAST TECHNIQUE: Multidetector CT imaging of the head and cervical spine was performed following the standard protocol without intravenous contrast. Multiplanar CT image reconstructions of the cervical spine were also generated. RADIATION DOSE REDUCTION: This exam was performed according to the departmental dose-optimization program which includes automated exposure control, adjustment of the mA and/or kV according to patient size and/or use of iterative reconstruction technique. COMPARISON:  None Available. FINDINGS: CT HEAD FINDINGS Brain: No evidence of intracranial hemorrhage, acute infarction, hydrocephalus, extra-axial collection, or mass lesion/mass effect. Mild chronic small vessel disease is noted. Vascular:  No hyperdense vessel or other acute findings. Skull: No evidence of fracture or other significant bone abnormality. Sinuses/Orbits: No acute  findings. Partial opacification of bilateral maxillary, ethmoid, and left frontal sinuses is seen. Other: None. CT CERVICAL SPINE FINDINGS Alignment: Normal. Skull base and vertebrae: No acute fracture. No primary bone lesion or focal pathologic process. Soft tissues and spinal canal: No prevertebral fluid or swelling. No visible canal hematoma. Disc levels: Moderate to severe degenerative disc disease is seen from levels of C2-C4, and C5-T1. Multilevel mild facet DJD is also seen bilaterally. 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 Upper chest: No acute findings. Other: None. IMPRESSION: No acute intracranial abnormality. Mild chronic small vessel disease.  Paranasal sinus disease. No evidence of acute cervical spine fracture or subluxation. Degenerative cervical spondylosis, as described above. 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 Electronically Signed   By: Norleen DELENA Kil M.D.   On: 12/24/2023 14:53   CT Angio Chest Pulmonary Embolism (PE) W or WO Contrast Result Date: 12/24/2023 CLINICAL DATA:  Shortness of breath. Status post cardiac arrest. High probability for pulmonary embolism. EXAM: CT ANGIOGRAPHY CHEST WITH CONTRAST TECHNIQUE: Multidetector CT imaging of the chest was performed using the standard protocol during bolus administration of intravenous contrast. Multiplanar CT image reconstructions and MIPs were obtained to evaluate the vascular anatomy. RADIATION DOSE REDUCTION: This exam was performed according to the departmental dose-optimization program which includes automated exposure control, adjustment of the mA and/or kV according to patient size and/or use of iterative reconstruction technique. CONTRAST:  75mL OMNIPAQUE  IOHEXOL  350 MG/ML SOLN COMPARISON:  11/20/2015 FINDINGS: Cardiovascular: Satisfactory opacification of pulmonary arteries noted, and  no  pulmonary emboli identified. No evidence of thoracic aortic dissection or aneurysm. Mediastinum/Nodes: No masses or pathologically enlarged lymph nodes identified. Nasogastric tube is seen with its tip at the GE junction. Lungs/Pleura: Endotracheal tube in appropriate position. No evidence of pneumothorax or pleural effusion. Multiple tiny areas of ground-glass opacity are seen in both upper lobes, suspicious for aspiration although other infectious or inflammatory etiologies cannot be excluded. Upper abdomen: No acute findings. Musculoskeletal: Several acute nondisplaced bilateral anterior rib fractures are seen, which are consistent with recent CPR. Review of the MIP images confirms the above findings. IMPRESSION: No evidence of pulmonary embolism. Multiple tiny areas of ground-glass opacity in both upper lobes, suspicious for aspiration although other infectious or inflammatory etiologies cannot be excluded. Multiple acute nondisplaced bilateral anterior rib fractures. No evidence of pneumothorax or pleural effusion. Nasogastric tube tip at GE junction. Electronically Signed   By: Norleen DELENA Kil M.D.   On: 12/24/2023 14:46   DG Abdomen 1 View Result Date: 12/24/2023 CLINICAL DATA:  NG placement. EXAM: ABDOMEN - 1 VIEW COMPARISON:  Earlier radiograph dated 12/24/2023. FINDINGS: Enteric tube with side-port in the distal esophagus and tip in the region of the GE junction. Recommend further advancing by additional 15 cm. IMPRESSION: Enteric tube with tip in the region of the GE junction. Recommend further advancing by additional 15 cm. Electronically Signed   By: Vanetta Chou M.D.   On: 12/24/2023 14:15   DG Abd Portable 1V Result Date: 12/24/2023 CLINICAL DATA:  Enteric tube placement EXAM: PORTABLE ABDOMEN - 1 VIEW COMPARISON:  None Available. FINDINGS: Gastric/enteric tube tip projects over the distal esophagus. IMPRESSION: Gastric/enteric tube tip projects over the distal esophagus. Recommend  advancement. Electronically Signed   By: Limin  Xu M.D.   On: 12/24/2023 14:11   DG Chest Port 1 View Result Date: 12/24/2023 CLINICAL DATA:  Status post CPR.  Endotracheal tube placement. EXAM: PORTABLE CHEST 1 VIEW COMPARISON:  10/13/2023 FINDINGS: Endotracheal tube is seen in place with tip approximately 5 cm above the carina. Both lungs are well-aerated and clear. No pneumothorax or pleural effusion visualized. Heart size is within normal limits. IMPRESSION: Endotracheal tube in appropriate position. No active lung disease. Electronically Signed   By: Norleen DELENA Kil M.D.   On: 12/24/2023 14:10    Microbiology: Recent Results (from the past 240 hours)  Blood culture (routine x 2)     Status: None   Collection Time: 12/24/23  3:28 PM   Specimen: BLOOD LEFT HAND  Result Value Ref Range Status   Specimen Description BLOOD LEFT HAND  Final   Special Requests   Final    BOTTLES DRAWN AEROBIC ONLY Blood Culture results may not be optimal due to an inadequate volume of blood received in culture bottles   Culture   Final    NO GROWTH 5 DAYS Performed at [facility], [address] [place], [place] [address]    Report Status 12/29/2023 FINAL  Final  Blood culture (routine x 2)     Status: None   Collection Time: 12/24/23  3:38 PM   Specimen: BLOOD LEFT HAND  Result Value Ref Range Status   Specimen Description BLOOD LEFT HAND  Final   Special Requests   Final    BOTTLES DRAWN AEROBIC ONLY Blood Culture results may not be optimal due to an inadequate volume of blood received in culture bottles   Culture   Final    NO GROWTH 5 DAYS Performed at [facility]  Lab, [address], [place], [place] [address]    Report Status 12/29/2023 FINAL  Final  MRSA Next Gen by PCR, Nasal     Status: None   Collection Time: 12/24/23  4:02 PM   Specimen: Nasal Mucosa; Nasal Swab  Result Value Ref Range Status   MRSA by PCR Next Gen NOT DETECTED NOT DETECTED Final    Comment: (NOTE) The  GeneXpert MRSA Assay (FDA approved for NASAL specimens only), is one component of a comprehensive MRSA colonization surveillance program. It is not intended to diagnose MRSA infection nor to guide or monitor treatment for MRSA infections. Test performance is not FDA approved in patients less than 65 years old. Performed at [facility], [address], [place], [place] [address]   MRSA Next Gen by PCR, Nasal     Status: None   Collection Time: 12/24/23  8:30 PM   Specimen: Nasal Mucosa; Nasal Swab  Result Value Ref Range Status   MRSA by PCR Next Gen NOT DETECTED NOT DETECTED Final    Comment: (NOTE) The GeneXpert MRSA Assay (FDA approved for NASAL specimens only), is one component of a comprehensive MRSA colonization surveillance program. It is not intended to diagnose MRSA infection nor to guide or monitor treatment for MRSA infections. Test performance is not FDA approved in patients less than 78 years old. Performed at [facility], [address] [place], [place] [address]   Resp panel by RT-PCR (RSV, Flu A&B, Covid) Anterior Nasal Swab     Status: None   Collection Time: 12/25/23 10:18 AM   Specimen: Anterior Nasal Swab  Result Value Ref Range Status   SARS Coronavirus 2 by RT PCR NEGATIVE NEGATIVE Final   Influenza A by PCR NEGATIVE NEGATIVE Final   Influenza B by PCR NEGATIVE NEGATIVE Final    Comment: (NOTE) The Xpert Xpress SARS-CoV-2/FLU/RSV plus assay is intended as an aid in the diagnosis of influenza from Nasopharyngeal swab specimens and should not be used as a sole basis for treatment. Nasal washings and aspirates are unacceptable for Xpert Xpress SARS-CoV-2/FLU/RSV testing.  Fact Sheet for Patients: bloggercourse.com  Fact Sheet for Healthcare Providers: seriousbroker.it  This test is not yet approved or cleared by the [place]  FDA and has been authorized for detection and/or diagnosis of  SARS-CoV-2 by FDA under an Emergency Use Authorization (EUA). This EUA will remain in effect (meaning this test can be used) for the duration of the COVID-19 declaration under Section 564(b)(1) of the Act, 21 U.S.C. section 360bbb-3(b)(1), unless the authorization is terminated or revoked.     Resp Syncytial Virus by PCR NEGATIVE NEGATIVE Final    Comment: (NOTE) Fact Sheet for Patients: bloggercourse.com  Fact Sheet for Healthcare Providers: seriousbroker.it  This test is not yet approved or cleared by the [place]  FDA and has been authorized for detection and/or diagnosis of SARS-CoV-2 by FDA under an Emergency Use Authorization (EUA). This EUA will remain in effect (meaning this test can be used) for the duration of the COVID-19 declaration under Section 564(b)(1) of the Act, 21 U.S.C. section 360bbb-3(b)(1), unless the authorization is terminated or revoked.  Performed at [facility], [address] [place], [place] [address]     Time spent: 15 minutes  Signed: Zola LOISE Herter, MD Jan 23, 2024

## 2024-01-14 DEATH — deceased
